# Patient Record
Sex: Female | Born: 1960 | Race: White | Hispanic: No | Marital: Single | Smoking: Current every day smoker
Health system: Southern US, Community
[De-identification: ages and names within clinical notes are randomized; demographics above are authoritative.]

## PROBLEM LIST (undated history)

## (undated) DIAGNOSIS — E2749 Other adrenocortical insufficiency: Secondary | ICD-10-CM

## (undated) DIAGNOSIS — G8929 Other chronic pain: Secondary | ICD-10-CM

## (undated) DIAGNOSIS — I5032 Chronic diastolic (congestive) heart failure: Secondary | ICD-10-CM

## (undated) DIAGNOSIS — D649 Anemia, unspecified: Secondary | ICD-10-CM

## (undated) DIAGNOSIS — G4730 Sleep apnea, unspecified: Secondary | ICD-10-CM

## (undated) DIAGNOSIS — K219 Gastro-esophageal reflux disease without esophagitis: Secondary | ICD-10-CM

## (undated) DIAGNOSIS — F0390 Unspecified dementia without behavioral disturbance: Secondary | ICD-10-CM

## (undated) DIAGNOSIS — C801 Malignant (primary) neoplasm, unspecified: Secondary | ICD-10-CM

## (undated) DIAGNOSIS — I2510 Atherosclerotic heart disease of native coronary artery without angina pectoris: Secondary | ICD-10-CM

## (undated) DIAGNOSIS — J449 Chronic obstructive pulmonary disease, unspecified: Secondary | ICD-10-CM

## (undated) DIAGNOSIS — J329 Chronic sinusitis, unspecified: Secondary | ICD-10-CM

## (undated) DIAGNOSIS — F419 Anxiety disorder, unspecified: Secondary | ICD-10-CM

## (undated) DIAGNOSIS — E2740 Unspecified adrenocortical insufficiency: Secondary | ICD-10-CM

## (undated) DIAGNOSIS — J441 Chronic obstructive pulmonary disease with (acute) exacerbation: Secondary | ICD-10-CM

## (undated) DIAGNOSIS — E785 Hyperlipidemia, unspecified: Secondary | ICD-10-CM

## (undated) DIAGNOSIS — E240 Pituitary-dependent Cushing's disease: Secondary | ICD-10-CM

## (undated) DIAGNOSIS — C439 Malignant melanoma of skin, unspecified: Secondary | ICD-10-CM

## (undated) HISTORY — PX: UMBILICAL HERNIA REPAIR: SHX196

## (undated) HISTORY — DX: Other adrenocortical insufficiency: E27.49

## (undated) HISTORY — PX: NASAL SINUS SURGERY: SHX719

## (undated) HISTORY — DX: Sleep apnea, unspecified: G47.30

## (undated) HISTORY — PX: VAGINAL HYSTERECTOMY: SUR661

## (undated) HISTORY — DX: Malignant melanoma of skin, unspecified: C43.9

## (undated) HISTORY — DX: Chronic sinusitis, unspecified: J32.9

## (undated) HISTORY — DX: Anemia, unspecified: D64.9

## (undated) HISTORY — DX: Chronic obstructive pulmonary disease with (acute) exacerbation: J44.1

## (undated) HISTORY — PX: OTHER SURGICAL HISTORY: SHX169

## (undated) HISTORY — PX: APPENDECTOMY: SHX54

## (undated) HISTORY — PX: NASAL SEPTUM SURGERY: SHX37

---

## 2000-10-02 ENCOUNTER — Ambulatory Visit (HOSPITAL_COMMUNITY): Admission: RE | Admit: 2000-10-02 | Discharge: 2000-10-02 | Payer: Self-pay | Admitting: Neurosurgery

## 2000-10-02 ENCOUNTER — Encounter: Payer: Self-pay | Admitting: Neurosurgery

## 2001-07-05 ENCOUNTER — Ambulatory Visit (HOSPITAL_COMMUNITY): Admission: RE | Admit: 2001-07-05 | Discharge: 2001-07-05 | Payer: Self-pay | Admitting: Neurosurgery

## 2001-07-05 ENCOUNTER — Encounter: Payer: Self-pay | Admitting: Neurosurgery

## 2002-01-16 ENCOUNTER — Encounter: Payer: Self-pay | Admitting: Emergency Medicine

## 2002-01-16 ENCOUNTER — Emergency Department (HOSPITAL_COMMUNITY): Admission: EM | Admit: 2002-01-16 | Discharge: 2002-01-16 | Payer: Self-pay | Admitting: Emergency Medicine

## 2002-03-21 ENCOUNTER — Encounter: Payer: Self-pay | Admitting: Neurosurgery

## 2002-03-21 ENCOUNTER — Inpatient Hospital Stay (HOSPITAL_COMMUNITY): Admission: RE | Admit: 2002-03-21 | Discharge: 2002-03-22 | Payer: Self-pay | Admitting: Neurosurgery

## 2002-12-13 ENCOUNTER — Encounter: Admission: RE | Admit: 2002-12-13 | Discharge: 2002-12-13 | Payer: Self-pay | Admitting: Neurosurgery

## 2002-12-13 ENCOUNTER — Encounter: Payer: Self-pay | Admitting: Neurosurgery

## 2002-12-13 ENCOUNTER — Encounter: Payer: Self-pay | Admitting: Radiology

## 2007-01-29 ENCOUNTER — Emergency Department: Payer: Self-pay | Admitting: Emergency Medicine

## 2007-02-21 ENCOUNTER — Inpatient Hospital Stay: Payer: Self-pay | Admitting: Obstetrics & Gynecology

## 2007-04-06 ENCOUNTER — Ambulatory Visit: Payer: Self-pay | Admitting: General Surgery

## 2007-05-25 ENCOUNTER — Ambulatory Visit: Payer: Self-pay | Admitting: General Surgery

## 2008-11-01 ENCOUNTER — Ambulatory Visit: Payer: Self-pay | Admitting: Family

## 2009-01-03 ENCOUNTER — Ambulatory Visit: Payer: Self-pay | Admitting: Specialist

## 2009-01-25 ENCOUNTER — Ambulatory Visit: Payer: Self-pay | Admitting: Specialist

## 2010-05-22 ENCOUNTER — Ambulatory Visit: Payer: Self-pay | Admitting: Specialist

## 2010-06-27 ENCOUNTER — Ambulatory Visit: Payer: Self-pay | Admitting: Specialist

## 2011-04-24 ENCOUNTER — Inpatient Hospital Stay: Payer: Self-pay | Admitting: Internal Medicine

## 2011-05-07 ENCOUNTER — Ambulatory Visit: Payer: Self-pay | Admitting: Internal Medicine

## 2011-07-10 ENCOUNTER — Ambulatory Visit: Payer: Self-pay | Admitting: Neurosurgery

## 2011-09-07 ENCOUNTER — Ambulatory Visit: Payer: Self-pay | Admitting: Specialist

## 2011-10-02 DIAGNOSIS — J309 Allergic rhinitis, unspecified: Secondary | ICD-10-CM | POA: Insufficient documentation

## 2012-06-23 ENCOUNTER — Ambulatory Visit: Payer: Self-pay | Admitting: Nurse Practitioner

## 2012-09-08 ENCOUNTER — Encounter: Payer: Self-pay | Admitting: Neurosurgery

## 2012-10-11 ENCOUNTER — Other Ambulatory Visit: Payer: Self-pay | Admitting: Neurosurgery

## 2012-10-11 DIAGNOSIS — M545 Low back pain, unspecified: Secondary | ICD-10-CM

## 2012-10-11 DIAGNOSIS — M47817 Spondylosis without myelopathy or radiculopathy, lumbosacral region: Secondary | ICD-10-CM

## 2012-10-15 ENCOUNTER — Other Ambulatory Visit: Payer: Self-pay

## 2012-10-17 ENCOUNTER — Ambulatory Visit
Admission: RE | Admit: 2012-10-17 | Discharge: 2012-10-17 | Disposition: A | Discharge status: Home / Self Care | Payer: 59 | Source: Ambulatory Visit | Attending: Neurosurgery | Admitting: Neurosurgery

## 2012-10-17 DIAGNOSIS — M545 Low back pain, unspecified: Secondary | ICD-10-CM

## 2012-10-17 DIAGNOSIS — M47817 Spondylosis without myelopathy or radiculopathy, lumbosacral region: Secondary | ICD-10-CM

## 2013-01-02 ENCOUNTER — Ambulatory Visit: Payer: Self-pay | Admitting: Specialist

## 2013-06-17 ENCOUNTER — Ambulatory Visit: Payer: Self-pay | Admitting: Neurosurgery

## 2013-07-11 ENCOUNTER — Ambulatory Visit: Payer: Self-pay | Admitting: Internal Medicine

## 2013-07-20 HISTORY — PX: OTHER SURGICAL HISTORY: SHX169

## 2013-10-10 ENCOUNTER — Ambulatory Visit: Payer: Self-pay | Admitting: Podiatry

## 2013-10-16 ENCOUNTER — Ambulatory Visit: Payer: 59 | Admitting: Podiatry

## 2014-06-20 ENCOUNTER — Ambulatory Visit: Payer: 59 | Admitting: Podiatry

## 2015-03-20 ENCOUNTER — Emergency Department
Admission: EM | Admit: 2015-03-20 | Discharge: 2015-03-20 | Disposition: A | Discharge status: Home / Self Care | Payer: Medicaid Other | Attending: Emergency Medicine | Admitting: Emergency Medicine

## 2015-03-20 ENCOUNTER — Encounter: Payer: Self-pay | Admitting: Emergency Medicine

## 2015-03-20 ENCOUNTER — Emergency Department: Payer: Medicaid Other

## 2015-03-20 DIAGNOSIS — Y998 Other external cause status: Secondary | ICD-10-CM | POA: Insufficient documentation

## 2015-03-20 DIAGNOSIS — Z720 Tobacco use: Secondary | ICD-10-CM | POA: Insufficient documentation

## 2015-03-20 DIAGNOSIS — Y92009 Unspecified place in unspecified non-institutional (private) residence as the place of occurrence of the external cause: Secondary | ICD-10-CM | POA: Diagnosis not present

## 2015-03-20 DIAGNOSIS — Z23 Encounter for immunization: Secondary | ICD-10-CM | POA: Insufficient documentation

## 2015-03-20 DIAGNOSIS — L03115 Cellulitis of right lower limb: Secondary | ICD-10-CM | POA: Diagnosis not present

## 2015-03-20 DIAGNOSIS — Y9389 Activity, other specified: Secondary | ICD-10-CM | POA: Insufficient documentation

## 2015-03-20 DIAGNOSIS — W01198A Fall on same level from slipping, tripping and stumbling with subsequent striking against other object, initial encounter: Secondary | ICD-10-CM | POA: Diagnosis not present

## 2015-03-20 DIAGNOSIS — Z880 Allergy status to penicillin: Secondary | ICD-10-CM | POA: Insufficient documentation

## 2015-03-20 DIAGNOSIS — S81811A Laceration without foreign body, right lower leg, initial encounter: Secondary | ICD-10-CM | POA: Diagnosis present

## 2015-03-20 HISTORY — DX: Unspecified adrenocortical insufficiency: E27.40

## 2015-03-20 LAB — CBC WITH DIFFERENTIAL/PLATELET
BASOS ABS: 0.1 10*3/uL (ref 0–0.1)
BASOS PCT: 1 %
EOS ABS: 0 10*3/uL (ref 0–0.7)
Eosinophils Relative: 0 %
HEMATOCRIT: 38 % (ref 35.0–47.0)
Hemoglobin: 12.6 g/dL (ref 12.0–16.0)
Lymphocytes Relative: 19 %
Lymphs Abs: 2.3 10*3/uL (ref 1.0–3.6)
MCH: 29.8 pg (ref 26.0–34.0)
MCHC: 33.3 g/dL (ref 32.0–36.0)
MCV: 89.7 fL (ref 80.0–100.0)
MONO ABS: 0.7 10*3/uL (ref 0.2–0.9)
Monocytes Relative: 6 %
NEUTROS ABS: 8.8 10*3/uL — AB (ref 1.4–6.5)
Neutrophils Relative %: 74 %
PLATELETS: 331 10*3/uL (ref 150–440)
RBC: 4.23 MIL/uL (ref 3.80–5.20)
RDW: 15 % — AB (ref 11.5–14.5)
WBC: 11.9 10*3/uL — ABNORMAL HIGH (ref 3.6–11.0)

## 2015-03-20 LAB — BASIC METABOLIC PANEL
ANION GAP: 13 (ref 5–15)
BUN: 13 mg/dL (ref 6–20)
CALCIUM: 9.3 mg/dL (ref 8.9–10.3)
CO2: 28 mmol/L (ref 22–32)
CREATININE: 1.09 mg/dL — AB (ref 0.44–1.00)
Chloride: 95 mmol/L — ABNORMAL LOW (ref 101–111)
GFR, EST NON AFRICAN AMERICAN: 57 mL/min — AB (ref >60)
GLUCOSE: 93 mg/dL (ref 65–99)
Potassium: 3.2 mmol/L — ABNORMAL LOW (ref 3.5–5.1)
Sodium: 136 mmol/L (ref 135–145)

## 2015-03-20 LAB — SEDIMENTATION RATE: SED RATE: 52 mm/h — AB (ref 0–30)

## 2015-03-20 MED ORDER — HYDROMORPHONE HCL 1 MG/ML IJ SOLN
1.0000 mg | Freq: Once | INTRAMUSCULAR | Status: AC
  Administered 2015-03-20: 1 mg via INTRAVENOUS
  Filled 2015-03-20: qty 1

## 2015-03-20 MED ORDER — TETANUS-DIPHTH-ACELL PERTUSSIS 5-2.5-18.5 LF-MCG/0.5 IM SUSP
0.5000 mL | Freq: Once | INTRAMUSCULAR | Status: AC
  Administered 2015-03-20: 0.5 mL via INTRAMUSCULAR
  Filled 2015-03-20: qty 0.5

## 2015-03-20 MED ORDER — HYDROMORPHONE HCL 2 MG PO TABS: 1.0000 mg | ORAL_TABLET | ORAL | Status: DC | PRN

## 2015-03-20 MED ORDER — CLINDAMYCIN HCL 300 MG PO CAPS: 300.0000 mg | ORAL_CAPSULE | Freq: Four times a day (QID) | ORAL | Status: DC

## 2015-03-20 NOTE — ED Notes (Signed)
Pt from home after fall on 8/21. She fell in her laundry room and hit either an air compressor or paint can - not sure which. Leg has scabbed over skin tear with red, hot border approximately 1 inch around. Pt states she is in considerable pain. NAD noted.

## 2015-03-20 NOTE — ED Notes (Signed)
Here for skin tear to right lower leg.  Some redness noted around tear.

## 2015-03-20 NOTE — ED Provider Notes (Signed)
[facility] Emergency Department Provider Note  ____________________________________________  Time seen: Approximately 2:45 PM  I have reviewed the triage vital signs and the nursing notes.   HISTORY  Chief Complaint Extremity Laceration    HPI [name] is a 54 y.o. female who presents for evaluation of a laceration to her right lower leg times one week. Patient states that she is on prednisone and bleeds easily. Has been having increasing redness and poor healing around that concerning for osteomyelitis.   Past Medical History  Diagnosis Date  . Adrenal insufficiency     There are no active problems to display for this patient.   No past surgical history on file.  Current Outpatient Rx  Name  Route  Sig  Dispense  Refill  . clindamycin (CLEOCIN) 300 MG capsule   Oral   Take 1 capsule (300 mg total) by mouth 4 (four) times daily.   40 capsule   0     Allergies Levaquin; Codeine; Cymbalta; Eggs or egg-derived products; Guaifenesin & derivatives; Keflex; Klonopin; Macrobid; Penicillins; and Sulfa antibiotics  No family history on file.  Social History Social History  Substance Use Topics  . Smoking status: Current Every Day Smoker  . Smokeless tobacco: None  . Alcohol Use: None    Review of Systems Constitutional: No fever/chills Eyes: No visual changes. ENT: No sore throat. Cardiovascular: Denies chest pain. Respiratory: Denies shortness of breath. Gastrointestinal: No abdominal pain.  No nausea, no vomiting.  No diarrhea.  No constipation. Genitourinary: Negative for dysuria. Musculoskeletal: Negative for back pain. Skin: Positive for skin tear to right lower anterior leg. Redness around the wound. Neurological: Negative for headaches, focal weakness or numbness.  10-point ROS otherwise negative.  ____________________________________________   PHYSICAL EXAM:  VITAL SIGNS: ED Triage Vitals  Enc Vitals Group     BP  03/20/15 1421 149/79 mmHg     Pulse Rate 03/20/15 1421 103     Resp 03/20/15 1421 18     Temp 03/20/15 1421 98.1 F (36.7 C)     Temp Source 03/20/15 1421 Oral     SpO2 03/20/15 1421 95 %     Weight 03/20/15 1421 172 lb (78.019 kg)     Height --      Head Cir --      Peak Flow --      Pain Score 03/20/15 1421 10     Pain Loc --      Pain Edu? --      Excl. in [place]? --     Constitutional: Alert and oriented. Well appearing and in no acute distress. Eyes: Conjunctivae are normal. PERRL. EOMI. Head: Atraumatic. Nose: No congestion/rhinnorhea. Mouth/Throat: Mucous membranes are moist.  Oropharynx non-erythematous. Neck: No stridor.   Cardiovascular: Normal rate, regular rhythm. Grossly normal heart sounds.  Good peripheral circulation. Respiratory: Normal respiratory effort.  No retractions. Lungs CTAB. Musculoskeletal: No lower extremity tenderness nor edema.  No joint effusions. Neurologic:  Normal speech and language. No gross focal neurologic deficits are appreciated. No gait instability. Skin:  Skin tear to right lower leg with 6 cm area of erythema noted circumferentially around the tear. Dried blood noted within the wound itself. Positive tenderness to the skin Psychiatric: Mood and affect are normal. Speech and behavior are normal.  ____________________________________________   LABS (all labs ordered are listed, but only abnormal results are displayed)  Labs Reviewed  BASIC METABOLIC PANEL - Abnormal; Notable for the following:    Potassium 3.2 (*)  Chloride 95 (*)    Creatinine, Ser 1.09 (*)    GFR calc non Af Amer 57 (*)    All other components within normal limits  CBC WITH DIFFERENTIAL/PLATELET - Abnormal; Notable for the following:    WBC 11.9 (*)    RDW 15.0 (*)    Neutro Abs 8.8 (*)    All other components within normal limits  SEDIMENTATION RATE - Abnormal; Notable for the following:    Sed Rate 52 (*)    All other components within normal limits   CULTURE, BLOOD (ROUTINE X 2)  CULTURE, BLOOD (ROUTINE X 2)   ____________________________________________  RADIOLOGY  Negative for osteomyelitis interpreted by radiologist and reviewed by myself. ____________________________________________   PROCEDURES  Procedure(s) performed: None  Critical Care performed: No  ____________________________________________   INITIAL IMPRESSION / ASSESSMENT AND PLAN / ED COURSE  Pertinent labs & imaging results that were available during my care of the patient were reviewed by me and considered in my medical decision making (see chart for details).  Cellulitis right lower leg. Rx started for clindamycin 300 mg 4 times a day based on present allergy list. Continue pain medications from home. Return in 48 hours for wound check and voices understanding and offers no other emergency medical complaints at this time. ____________________________________________   FINAL CLINICAL IMPRESSION(S) / ED DIAGNOSES  Final diagnoses:  Cellulitis of right lower leg      Arlyss Repress, PA-C 03/20/15 1636  Harvest Dark, MD 03/20/15 2315

## 2015-03-20 NOTE — ED Notes (Signed)
.  [email or web address]

## 2015-03-20 NOTE — Discharge Instructions (Signed)

## 2015-03-22 ENCOUNTER — Ambulatory Visit: Payer: Self-pay | Admitting: Surgery

## 2015-03-22 ENCOUNTER — Telehealth: Payer: Self-pay | Admitting: Emergency Medicine

## 2015-03-22 NOTE — ED Notes (Signed)
Pt called asking if she had to come back today.  Say sshe has only taken 1 full day of antibiiotic and would rather wait until tomorrow.  i told her that her instructions state 48 hours and that would be this afternoon.  I told her it would ultimately be up to her to  Decide on return time.  She says her pcp is in [place], but she plans to change to one in .  i told her that so far there was no growth on the blood cutlures.  She says the would is opening slightly and draining some now.  Says she feels about the same as she did on visit date--no better and no worse.  I told her that she would need to make the decision on when to return, as i could only tell what the instructions said.  I told her that it would be very bad if she became septic.    She says she will return, either today or tomorrow, and if she waits until tomorrow, she will return sooner if she starts feeling worse.

## 2015-03-23 ENCOUNTER — Emergency Department
Admission: EM | Admit: 2015-03-23 | Discharge: 2015-03-23 | Disposition: A | Discharge status: Home / Self Care | Payer: Medicaid Other | Attending: Emergency Medicine | Admitting: Emergency Medicine

## 2015-03-23 ENCOUNTER — Encounter: Payer: Self-pay | Admitting: Emergency Medicine

## 2015-03-23 DIAGNOSIS — Z4800 Encounter for change or removal of nonsurgical wound dressing: Secondary | ICD-10-CM | POA: Diagnosis present

## 2015-03-23 DIAGNOSIS — Z880 Allergy status to penicillin: Secondary | ICD-10-CM | POA: Diagnosis not present

## 2015-03-23 DIAGNOSIS — L03115 Cellulitis of right lower limb: Secondary | ICD-10-CM

## 2015-03-23 DIAGNOSIS — Z792 Long term (current) use of antibiotics: Secondary | ICD-10-CM | POA: Insufficient documentation

## 2015-03-23 DIAGNOSIS — Z720 Tobacco use: Secondary | ICD-10-CM | POA: Diagnosis not present

## 2015-03-23 MED ORDER — HYDROMORPHONE HCL 2 MG PO TABS: 1.0000 mg | ORAL_TABLET | ORAL | Status: DC | PRN

## 2015-03-23 NOTE — ED Provider Notes (Signed)
[facility] Emergency Department Provider Note  ____________________________________________  Time seen: Approximately 12:06 PM  I have reviewed the triage vital signs and the nursing notes.   HISTORY  Chief Complaint Wound Check    HPI [name] is a 54 y.o. female patient here today for follow-up of the left lower leg. Patient is skin tear that was slow healing of the suspected cellulitis. Patient appears visits have blood cultures drawn which were negative. Patient placed on clindamycin and Dilaudid. Patient today for reevaluation will and wound check. Patient rated her complaint is a 7/10.   Past Medical History  Diagnosis Date  . Adrenal insufficiency     There are no active problems to display for this patient.   No past surgical history on file.  Current Outpatient Rx  Name  Route  Sig  Dispense  Refill  . clindamycin (CLEOCIN) 300 MG capsule   Oral   Take 1 capsule (300 mg total) by mouth 4 (four) times daily.   40 capsule   0   . HYDROmorphone (DILAUDID) 2 MG tablet   Oral   Take 0.5 tablets (1 mg total) by mouth every 4 (four) hours as needed for severe pain. DO NOT TAKE WITH OXYCODONE!   8 tablet   0     Allergies Levaquin; Codeine; Cymbalta; Eggs or egg-derived products; Guaifenesin & derivatives; Keflex; Klonopin; Macrobid; Penicillins; and Sulfa antibiotics  No family history on file.  Social History Social History  Substance Use Topics  . Smoking status: Current Every Day Smoker  . Smokeless tobacco: None  . Alcohol Use: No    Review of Systems Constitutional: No fever/chills Eyes: No visual changes. ENT: No sore throat. Cardiovascular: Denies chest pain. Respiratory: Denies shortness of breath. Gastrointestinal: No abdominal pain.  No nausea, no vomiting.  No diarrhea.  No constipation. Genitourinary: Negative for dysuria. Musculoskeletal: Negative for back pain. Skin: Negative for rash. Multiple right  lower leg. Neurological: Negative for headaches, focal weakness or numbness. Hematological/Lymphatic: Allergic/Immunilogical: See extensive medication list.  10-point ROS otherwise negative.  ____________________________________________   PHYSICAL EXAM:  VITAL SIGNS: ED Triage Vitals  Enc Vitals Group     BP 03/23/15 1123 115/73 mmHg     Pulse Rate 03/23/15 1123 108     Resp 03/23/15 1123 18     Temp 03/23/15 1123 98.2 F (36.8 C)     Temp Source 03/23/15 1123 Oral     SpO2 03/23/15 1123 95 %     Weight 03/23/15 1123 172 lb (78.019 kg)     Height 03/23/15 1123 5\' 4"  (1.626 m)     Head Cir --      Peak Flow --      Pain Score 03/23/15 1124 7     Pain Loc --      Pain Edu? --      Excl. in [place]? --     Constitutional: Alert and oriented. Well appearing and in no acute distress. Eyes: Conjunctivae are normal. PERRL. EOMI. Head: Atraumatic. Nose: No congestion/rhinnorhea. Mouth/Throat: Mucous membranes are moist.  Oropharynx non-erythematous. Neck: No stridor.  No cervical spine tenderness to palpation. Hematological/Lymphatic/Immunilogical: No cervical lymphadenopathy. Cardiovascular: Normal rate, regular rhythm. Grossly normal heart sounds.  Good peripheral circulation. Respiratory: Normal respiratory effort.  No retractions. Lungs CTAB. Gastrointestinal: Soft and nontender. No distention. No abdominal bruits. No CVA tenderness. Musculoskeletal: No lower extremity tenderness nor edema.  No joint effusions. Neurologic:  Normal speech and language. No gross focal neurologic  deficits are appreciated. No gait instability. Skin:  Skin is warm, dry and intact. No rash noted. Noticed erythema and edema has not exceeded the lateral skin marker placed on previous visit. There is no discharge from the wound site at this time. Area still has some moderate guarding with palpation. Psychiatric: Mood and affect are normal. Speech and behavior are  normal.  ____________________________________________   LABS (all labs ordered are listed, but only abnormal results are displayed)  Labs Reviewed - No data to display ____________________________________________  EKG   ____________________________________________  RADIOLOGY   ____________________________________________   PROCEDURES  Procedure(s) performed: None  Critical Care performed: No  ____________________________________________   INITIAL IMPRESSION / ASSESSMENT AND PLAN / ED COURSE  Pertinent labs & imaging results that were available during my care of the patient were reviewed by me and considered in my medical decision making (see chart for details).  Resolving cellulitis right lower leg. Advised patient attending the antibodies as directed. Advised patient to call the wound clinic on Tuesday morning and reschedule her appointment for continual care. Patient advised return by ER condition worsens. ____________________________________________   FINAL CLINICAL IMPRESSION(S) / ED DIAGNOSES  Final diagnoses:  Cellulitis of right leg without foot      Sable Feil, PA-C 03/23/15 1212  Daymon Larsen, MD 03/23/15 1242

## 2015-03-23 NOTE — ED Notes (Signed)
PA at bedside at this time.  

## 2015-03-23 NOTE — ED Notes (Signed)
Sterile dressing applied to pt right lower leg.  Pt verbalized understanding of redressing and to leave open to air while at home.

## 2015-03-23 NOTE — Discharge Instructions (Signed)
Apply dry dressing at night and when erver leaving the house.  Call Wound Clinic on Tuesday to re-schedule appointment.

## 2015-03-23 NOTE — ED Notes (Signed)
Patient to ER for wound check to lower leg.

## 2015-03-25 LAB — CULTURE, BLOOD (ROUTINE X 2): CULTURE: NO GROWTH

## 2015-03-27 ENCOUNTER — Encounter: Payer: Medicaid Other | Attending: Surgery | Admitting: Surgery

## 2015-03-27 DIAGNOSIS — Z7952 Long term (current) use of systemic steroids: Secondary | ICD-10-CM | POA: Insufficient documentation

## 2015-03-27 DIAGNOSIS — E278 Other specified disorders of adrenal gland: Secondary | ICD-10-CM | POA: Diagnosis not present

## 2015-03-27 DIAGNOSIS — J449 Chronic obstructive pulmonary disease, unspecified: Secondary | ICD-10-CM | POA: Diagnosis not present

## 2015-03-27 DIAGNOSIS — I872 Venous insufficiency (chronic) (peripheral): Secondary | ICD-10-CM | POA: Diagnosis not present

## 2015-03-27 DIAGNOSIS — L97211 Non-pressure chronic ulcer of right calf limited to breakdown of skin: Secondary | ICD-10-CM | POA: Diagnosis present

## 2015-03-27 DIAGNOSIS — I83212 Varicose veins of right lower extremity with both ulcer of calf and inflammation: Secondary | ICD-10-CM | POA: Insufficient documentation

## 2015-03-28 NOTE — Progress Notes (Addendum)
MATEJA, DIER ([id number]) Visit Report for 03/27/2015 Allergy List Details Patient Name: JANETT, KAMATH. Date of Service: 03/27/2015 1:00 PM Medical Record Number: [id number] Patient Account Number: [id number] Date of Birth/Sex: 03-13-1961 (54 y.o. Female) Treating RN: Montey Hora Primary Care Physician: Sharin Mons Other Clinician: Referring Physician: Delman Kitten Treating Physician/Extender: BURNS III, WALTER Weeks in Treatment: 0 Allergies Active Allergies Levaquin codeine Cymbalta Egg Derived guaifenesin Keflex Klonopin Macrobid penicillin Sulfa (Sulfonamide Antibiotics) gatifloxacin Allergy Notes Electronic Signature(s) Signed: 03/28/2015 5:13:06 PM By: Montey Hora Previous Signature: 03/27/2015 1:11:59 PM Version By: Montey Hora Entered By: Montey Hora on 03/27/2015 13:27:25 Trotti, Breasia D. ([id number]) -------------------------------------------------------------------------------- Arrival Information Details Patient Name: MARIELA, REX D. Date of Service: 03/27/2015 1:00 PM Medical Record Number: [id number] Patient Account Number: [id number] Date of Birth/Sex: 1961-03-05 (54 y.o. Female) Treating RN: Montey Hora Primary Care Physician: Sharin Mons Other Clinician: Referring Physician: Delman Kitten Treating Physician/Extender: BURNS III, Charlean Sanfilippo in Treatment: 0 Visit Information Patient Arrived: Ambulatory Arrival Time: 13:21 Accompanied By: self Transfer Assistance: None Patient Identification Verified: Yes Secondary Verification Process Yes Completed: Electronic Signature(s) Signed: 03/28/2015 5:13:06 PM By: Montey Hora Entered By: Montey Hora on 03/27/2015 13:22:41 Eaglin, Kameo D. ([id number]) -------------------------------------------------------------------------------- Clinic Level of Care Assessment Details Patient Name: AIDEL, DAVISSON D. Date of Service: 03/27/2015 1:00 PM Medical Record Number: [id number] Patient Account Number:  [id number] Date of Birth/Sex: Feb 11, 1961 (54 y.o. Female) Treating RN: Montey Hora Primary Care Physician: Sharin Mons Other Clinician: Referring Physician: Delman Kitten Treating Physician/Extender: BURNS III, Charlean Sanfilippo in Treatment: 0 Clinic Level of Care Assessment Items TOOL 1 Quantity Score []  - Use when EandM and Procedure is performed on INITIAL visit 0 ASSESSMENTS - Nursing Assessment / Reassessment X - General Physical Exam (combine w/ comprehensive assessment (listed just 1 20 below) when performed on new pt. evals) X - Comprehensive Assessment (HX, ROS, Risk Assessments, Wounds Hx, etc.) 1 25 ASSESSMENTS - Wound and Skin Assessment / Reassessment []  - Dermatologic / Skin Assessment (not related to wound area) 0 ASSESSMENTS - Ostomy and/or Continence Assessment and Care []  - Incontinence Assessment and Management 0 []  - Ostomy Care Assessment and Management (repouching, etc.) 0 PROCESS - Coordination of Care X - Simple Patient / Family Education for ongoing care 1 15 []  - Complex (extensive) Patient / Family Education for ongoing care 0 X - Staff obtains Consents, Records, Test Results / Process Orders 1 10 []  - Staff telephones HHA, Nursing Homes / Clarify orders / etc 0 []  - Routine Transfer to another Facility (non-emergent condition) 0 []  - Routine Hospital Admission (non-emergent condition) 0 X - New Admissions / Biomedical engineer / Ordering NPWT, Apligraf, etc. 1 15 []  - Emergency Hospital Admission (emergent condition) 0 PROCESS - Special Needs []  - Pediatric / Minor Patient Management 0 []  - Isolation Patient Management 0 DANISA, KOPEC. ([id number]) []  - Hearing / Language / Visual special needs 0 []  - Assessment of Community assistance (transportation, D/C planning, etc.) 0 []  - Additional assistance / Altered mentation 0 []  - Support Surface(s) Assessment (bed, cushion, seat, etc.) 0 INTERVENTIONS - Miscellaneous []  - External ear exam 0 []  -  Patient Transfer (multiple staff / Civil Service fast streamer / Similar devices) 0 []  - Simple Staple / Suture removal (25 or less) 0 []  - Complex Staple / Suture removal (26 or more) 0 []  - Hypo/Hyperglycemic Management (do not check if billed separately) 0 []  - Ankle / Brachial Index (ABI) - do not check if billed separately 0 Has the  patient been seen at the hospital within the last three years: Yes Total Score: 85 Level Of Care: New/Established - Level 3 Electronic Signature(s) Signed: 03/28/2015 5:13:06 PM By: Montey Hora Entered By: Montey Hora on 03/27/2015 13:59:58 Gauthier, Mairany D. ([id number]) -------------------------------------------------------------------------------- Encounter Discharge Information Details Patient Name: CHERYEL, KYTE D. Date of Service: 03/27/2015 1:00 PM Medical Record Number: [id number] Patient Account Number: [id number] Date of Birth/Sex: Dec 15, 1960 (54 y.o. Female) Treating RN: Montey Hora Primary Care Physician: Sharin Mons Other Clinician: Referring Physician: Delman Kitten Treating Physician/Extender: BURNS III, Charlean Sanfilippo in Treatment: 0 Encounter Discharge Information Items Discharge Pain Level: 0 Discharge Condition: Stable Ambulatory Status: Ambulatory Discharge Destination: Home Transportation: Private Auto Accompanied By: brother Schedule Follow-up Appointment: Yes Medication Reconciliation completed and provided to Patient/Care No Jamil Castillo: Provided on Clinical Summary of Care: 03/27/2015 Form Type Recipient Paper Patient SW Electronic Signature(s) Signed: 03/27/2015 2:23:18 PM By: Ruthine Dose Entered By: Ruthine Dose on 03/27/2015 14:23:18 [place], [place] D. ([id number]) -------------------------------------------------------------------------------- Lower Extremity Assessment Details Patient Name: KEYASHA, MIAH D. Date of Service: 03/27/2015 1:00 PM Medical Record Number: [id number] Patient Account Number: [id number] Date of Birth/Sex:  1960/12/11 (54 y.o. Female) Treating RN: Montey Hora Primary Care Physician: Sharin Mons Other Clinician: Referring Physician: Delman Kitten Treating Physician/Extender: BURNS III, Charlean Sanfilippo in Treatment: 0 Edema Assessment Assessed: [Left: No] [Right: No] E[Left: dema] [Right: :] Calf Left: Right: Point of Measurement: 30 cm From Medial Instep 36.7 cm 35.3 cm Ankle Left: Right: Point of Measurement: 10 cm From Medial Instep 20.4 cm 21.2 cm Vascular Assessment Pulses: Posterior Tibial Palpable: [Left:Yes] [Right:Yes] Doppler: [Left:Multiphasic] [Right:Multiphasic] Dorsalis Pedis Palpable: [Left:Yes] [Right:Yes] Doppler: [Left:Multiphasic] [Right:Multiphasic] Extremity colors, hair growth, and conditions: Extremity Color: [Left:Red] [Right:Normal] Hair Growth on Extremity: [Left:Yes] [Right:Yes] Temperature of Extremity: [Left:Warm] [Right:Warm] Capillary Refill: [Left:< 3 seconds] [Right:< 3 seconds] Toe Nail Assessment Left: Right: Thick: No No Discolored: No No Deformed: No No Improper Length and Hygiene: No No Electronic Signature(s) Signed: 03/28/2015 5:13:06 PM By: Jearld Shines, Katharine Look D. ([id number]) Entered By: Montey Hora on 03/27/2015 13:50:09 Koranda, Meri D. ([id number]) -------------------------------------------------------------------------------- Multi Wound Chart Details Patient Name: SARALYNN, LANGHORST D. Date of Service: 03/27/2015 1:00 PM Medical Record Number: [id number] Patient Account Number: [id number] Date of Birth/Sex: Dec 22, 1960 (54 y.o. Female) Treating RN: Montey Hora Primary Care Physician: Sharin Mons Other Clinician: Referring Physician: Delman Kitten Treating Physician/Extender: BURNS III, Charlean Sanfilippo in Treatment: 0 Vital Signs Height(in): 64 Pulse(bpm): 103 Weight(lbs): 172 Blood Pressure 128/71 (mmHg): Body Mass Index(BMI): 30 Temperature(F): 98.2 Respiratory Rate 18 (breaths/min): Photos: [1:No Photos]  [N/A:N/A] Wound Location: [1:Right Lower Leg - Anterior N/A] Wounding Event: [1:Trauma] [N/A:N/A] Primary Etiology: [1:Skin Tear] [N/A:N/A] Comorbid History: [1:Chronic Obstructive Pulmonary Disease (COPD), Sleep Apnea, Congestive Heart Failure, Raynaudos, Osteoarthritis, Neuropathy] [N/A:N/A] Date Acquired: [1:03/10/2015] [N/A:N/A] Weeks of Treatment: [1:0] [N/A:N/A] Wound Status: [1:Open] [N/A:N/A] Measurements L x W x D 2.7x3.7x0.2 [N/A:N/A] (cm) Area (cm) : [1:7.846] [N/A:N/A] Volume (cm) : [1:1.569] [N/A:N/A] % Reduction in Area: [1:0.00%] [N/A:N/A] % Reduction in Volume: 0.00% [N/A:N/A] Classification: [1:Full Thickness Without Exposed Support Structures] [N/A:N/A] Exudate Amount: [1:Medium] [N/A:N/A] Exudate Type: [1:Purulent] [N/A:N/A] Exudate Color: [1:yellow, brown, green] [N/A:N/A] Wound Margin: [1:Flat and Intact] [N/A:N/A] Granulation Amount: [1:None Present (0%)] [N/A:N/A] Necrotic Amount: [1:Large (67-100%)] [N/A:N/A] Necrotic Tissue: [1:Eschar] [N/A:N/A] Exposed Structures: [N/A:N/A] Fascia: No Fat: No Tendon: No Muscle: No Joint: No Bone: No Limited to Skin Breakdown Epithelialization: None N/A N/A Periwound Skin Texture: Edema: No N/A N/A Excoriation: No Induration: No Callus: No Crepitus: No Fluctuance: No Friable: No Rash:  No Scarring: No Periwound Skin Maceration: No N/A N/A Moisture: Moist: No Dry/Scaly: No Periwound Skin Color: Erythema: Yes N/A N/A Atrophie Blanche: No Cyanosis: No Ecchymosis: No Hemosiderin Staining: No Mottled: No Pallor: No Rubor: No Erythema Location: Circumferential N/A N/A Temperature: No Abnormality N/A N/A Tenderness on Yes N/A N/A Palpation: Wound Preparation: Ulcer Cleansing: N/A N/A Rinsed/Irrigated with Saline Topical Anesthetic Applied: Other: lidocaine 4% Treatment Notes Electronic Signature(s) Signed: 03/28/2015 5:13:06 PM By: Montey Hora Entered By: Montey Hora on 03/27/2015  13:59:14 Caine, Ronit D. ([id number]) -------------------------------------------------------------------------------- [place] Details Patient Name: BETZABE, BEVANS D. Date of Service: 03/27/2015 1:00 PM Medical Record Number: [id number] Patient Account Number: [id number] Date of Birth/Sex: 06-03-1961 (54 y.o. Female) Treating RN: Montey Hora Primary Care Physician: Sharin Mons Other Clinician: Referring Physician: Delman Kitten Treating Physician/Extender: BURNS III, Charlean Sanfilippo in Treatment: 0 Active Inactive Abuse / Safety / Falls / Self Care Management Nursing Diagnoses: Impaired physical mobility Potential for falls Goals: Patient will remain injury free Date Initiated: 03/27/2015 Goal Status: Active Interventions: Assess fall risk on admission and as needed Notes: Orientation to the Wound Care Program Nursing Diagnoses: Knowledge deficit related to the wound healing center program Goals: Patient/caregiver will verbalize understanding of the [facility] Date Initiated: 03/27/2015 Goal Status: Active Interventions: Provide education on orientation to the wound center Notes: Wound/Skin Impairment Nursing Diagnoses: Impaired tissue integrity Goals: Ulcer/skin breakdown will have a volume reduction of 30% by week 4 JUNITA, KUBOTA ([id number]) Date Initiated: 03/27/2015 Goal Status: Active Ulcer/skin breakdown will have a volume reduction of 50% by week 8 Date Initiated: 03/27/2015 Goal Status: Active Ulcer/skin breakdown will have a volume reduction of 80% by week 12 Date Initiated: 03/27/2015 Goal Status: Active Ulcer/skin breakdown will heal within 14 weeks Date Initiated: 03/27/2015 Goal Status: Active Interventions: Assess ulceration(s) every visit Notes: Electronic Signature(s) Signed: 03/28/2015 5:13:06 PM By: Montey Hora Entered By: Montey Hora on 03/27/2015 13:58:35 Warr, Sharolyn D.  ([id number]) -------------------------------------------------------------------------------- Pain Assessment Details Patient Name: MIAKODA, MCMILLION D. Date of Service: 03/27/2015 1:00 PM Medical Record Number: [id number] Patient Account Number: [id number] Date of Birth/Sex: 01/21/1961 (54 y.o. Female) Treating RN: Montey Hora Primary Care Physician: Sharin Mons Other Clinician: Referring Physician: Delman Kitten Treating Physician/Extender: BURNS III, Charlean Sanfilippo in Treatment: 0 Active Problems Location of Pain Severity and Description of Pain Patient Has Paino Yes Site Locations Pain Location: Pain in Ulcers With Dressing Change: Yes Duration of the Pain. Constant / Intermittento Constant Pain Management and Medication Current Pain Management: Electronic Signature(s) Signed: 03/28/2015 5:13:06 PM By: Montey Hora Entered By: Montey Hora on 03/27/2015 13:22:56 Rothery, Zuria D. ([id number]) -------------------------------------------------------------------------------- Patient/Caregiver Education Details Patient Name: VERANIA, SALBERG D. Date of Service: 03/27/2015 1:00 PM Medical Record Number: [id number] Patient Account Number: [id number] Date of Birth/Gender: Jul 19, 1961 (54 y.o. Female) Treating RN: Montey Hora Primary Care Physician: Sharin Mons Other Clinician: Referring Physician: Delman Kitten Treating Physician/Extender: BURNS III, Charlean Sanfilippo in Treatment: 0 Education Assessment Education Provided To: Patient and Caregiver Education Topics Provided Wound/Skin Impairment: Handouts: Other: wound care as ordered Methods: Demonstration, Explain/Verbal Responses: State content correctly Electronic Signature(s) Signed: 03/28/2015 5:13:06 PM By: Montey Hora Entered By: Montey Hora on 03/27/2015 14:21:04 [place], Rayvin D. ([id number]) -------------------------------------------------------------------------------- Wound Assessment Details Patient Name: CHASMINE, LENDER  D. Date of Service: 03/27/2015 1:00 PM Medical Record Number: [id number] Patient Account Number: [id number] Date of Birth/Sex: 1961/06/02 (54 y.o. Female) Treating RN: Montey Hora Primary Care Physician: Sharin Mons Other Clinician: Referring Physician: Delman Kitten Treating Physician/Extender: BURNS  III, WALTER Weeks in Treatment: 0 Wound Status Wound Number: 1 Primary Skin Tear Etiology: Wound Location: Right Lower Leg - Anterior Wound Open Wounding Event: Trauma Status: Date Acquired: 03/10/2015 Comorbid Chronic Obstructive Pulmonary Disease Weeks Of Treatment: 0 History: (COPD), Sleep Apnea, Congestive Clustered Wound: No Heart Failure, Raynaudos, Osteoarthritis, Neuropathy Photos Photo Uploaded By: Montey Hora on 03/28/2015 09:37:49 Wound Measurements Length: (cm) 2.7 Width: (cm) 3.7 Depth: (cm) 0.2 Area: (cm) 7.846 Volume: (cm) 1.569 % Reduction in Area: 0% % Reduction in Volume: 0% Epithelialization: None Tunneling: No Undermining: No Wound Description Full Thickness Without Exposed Classification: Support Structures Wound Margin: Flat and Intact Exudate Medium Amount: Exudate Type: Purulent Exudate Color: yellow, brown, green Foul Odor After Cleansing: No Wound Bed Granulation Amount: None Present (0%) Exposed Structure Pipe, Jasamine D. ([id number]) Necrotic Amount: Large (67-100%) Fascia Exposed: No Necrotic Quality: Eschar Fat Layer Exposed: No Tendon Exposed: No Muscle Exposed: No Joint Exposed: No Bone Exposed: No Limited to Skin Breakdown Periwound Skin Texture Texture Color No Abnormalities Noted: No No Abnormalities Noted: No Callus: No Atrophie Blanche: No Crepitus: No Cyanosis: No Excoriation: No Ecchymosis: No Fluctuance: No Erythema: Yes Friable: No Erythema Location: Circumferential Induration: No Hemosiderin Staining: No Localized Edema: No Mottled: No Rash: No Pallor: No Scarring: No Rubor: No Moisture  Temperature / Pain No Abnormalities Noted: No Temperature: No Abnormality Dry / Scaly: No Tenderness on Palpation: Yes Maceration: No Moist: No Wound Preparation Ulcer Cleansing: Rinsed/Irrigated with Saline Topical Anesthetic Applied: Other: lidocaine 4%, Treatment Notes Wound #1 (Right, Anterior Lower Leg) 1. Cleansed with: Clean wound with Normal Saline 2. Anesthetic Topical Lidocaine 4% cream to wound bed prior to debridement 3. Peri-wound Care: Skin Prep 4. Dressing Applied: Aquacel Ag 5. Secondary Dressing Applied Bordered Foam Dressing 6. Footwear/Offloading device applied Tubigrip Electronic Signature(s) Signed: 03/28/2015 5:13:06 PM By: Jearld Shines, Katharine Look D. ([id number]) Entered By: Montey Hora on 03/27/2015 13:44:13 [place], Clark D. ([id number]) -------------------------------------------------------------------------------- Vitals Details Patient Name: BRENDA, SAMANO D. Date of Service: 03/27/2015 1:00 PM Medical Record Number: [id number] Patient Account Number: [id number] Date of Birth/Sex: 02-Apr-1961 (54 y.o. Female) Treating RN: Montey Hora Primary Care Physician: Sharin Mons Other Clinician: Referring Physician: Delman Kitten Treating Physician/Extender: BURNS III, Charlean Sanfilippo in Treatment: 0 Vital Signs Time Taken: 13:22 Temperature (F): 98.2 Height (in): 64 Pulse (bpm): 103 Source: Stated Respiratory Rate (breaths/min): 18 Weight (lbs): 172 Blood Pressure (mmHg): 128/71 Source: Stated Reference Range: 80 - 120 mg / dl Body Mass Index (BMI): 29.5 Electronic Signature(s) Signed: 03/28/2015 5:13:06 PM By: Montey Hora Entered By: Montey Hora on 03/27/2015 13:26:04

## 2015-03-29 NOTE — Progress Notes (Signed)
BRODIE, SCOVELL ([id number]) Visit Report for 03/27/2015 Chief Complaint Document Details Patient Name: [name], [name]. Date of Service: 03/27/2015 1:00 PM Medical Record Patient Account Number: [id number] Number: Treating RN: Montey Hora 1961-04-29 (54 y.o. Other Clinician: Date of Birth/Sex: Female) Treating BURNS III, Primary Care Physician: Sharin Mons Physician/Extender: Thayer Jew Referring Physician: Delman Kitten Weeks in Treatment: 0 Information Obtained from: Patient Chief Complaint Right calf traumatic ulceration. Electronic Signature(s) Signed: 03/27/2015 4:02:17 PM By: Loletha Grayer MD Entered By: Loletha Grayer on 03/27/2015 15:46:37 Borrero, Rhina D. ([id number]) -------------------------------------------------------------------------------- Debridement Details Patient Name: [name], [name] D. Date of Service: 03/27/2015 1:00 PM Medical Record Patient Account Number: [id number] Number: Treating RN: Montey Hora 1961-04-12 (54 y.o. Other Clinician: Date of Birth/Sex: Female) Treating BURNS III, Primary Care Physician: Sharin Mons Physician/Extender: Thayer Jew Referring Physician: Delman Kitten Weeks in Treatment: 0 Debridement Performed for Wound #1 Right,Anterior Lower Leg Assessment: Performed By: Physician BURNS III, Teressa Senter., MD Debridement: Debridement Pre-procedure Yes Verification/Time Out Taken: Start Time: 13:58 Pain Control: Lidocaine 4% Topical Solution Level: Skin/Subcutaneous Tissue Total Area Debrided (L x 2.7 (cm) x 3.7 (cm) = 9.99 (cm) W): Tissue and other Viable, Non-Viable, Eschar, Fibrin/Slough, Subcutaneous material debrided: Instrument: Curette Bleeding: Minimum Hemostasis Achieved: Pressure End Time: 14:01 Procedural Pain: 5 Post Procedural Pain: 0 Response to Treatment: Procedure was tolerated well Post Debridement Measurements of Total Wound Length: (cm) 2.7 Width: (cm) 3.7 Depth: (cm) 0.4 Volume:  (cm) 3.138 Post Procedure Diagnosis Same as Pre-procedure Electronic Signature(s) Signed: 03/27/2015 4:02:17 PM By: Loletha Grayer MD Signed: 03/28/2015 5:13:06 PM By: Montey Hora Entered By: Montey Hora on 03/27/2015 14:01:57 Stipe, [name] ([id number]) -------------------------------------------------------------------------------- HPI Details Patient Name: [name], [name] D. Date of Service: 03/27/2015 1:00 PM Medical Record Patient Account Number: [id number] Number: Treating RN: Montey Hora 1961-05-22 (54 y.o. Other Clinician: Date of Birth/Sex: Female) Treating BURNS III, Primary Care Physician: Sharin Mons Physician/Extender: Thayer Jew Referring Physician: Delman Kitten Weeks in Treatment: 0 History of Present Illness HPI Description: Pleasant 54 year old with multiple medical problems including chronic venous insufficiency, adrenal insufficiency (on prednisone 10 mg daily), COPD, and congestive heart failure. No history of diabetes or peripheral arterial disease. She says that she traumatized her right leg in late August 2016 and suffered a skin tear. This has progressively worsened. She developed cellulitis, which is improving on clindamycin. Not performing any dressing changes. Ambulating per her baseline. No claudication or rest pain. X-ray 03/20/2015 showed no evidence for fracture or osteomyelitis. She reports moderate to severe pain with pressure. No fever or chills. Minimal drainage. Electronic Signature(s) Signed: 03/27/2015 4:02:17 PM By: Loletha Grayer MD Entered By: Loletha Grayer on 03/27/2015 15:50:24 [name] ([id number]) -------------------------------------------------------------------------------- Physical Exam Details Patient Name: [name], [name] D. Date of Service: 03/27/2015 1:00 PM Medical Record Patient Account Number: [id number] Number: Treating RN: Montey Hora [birth year] (54 y.o. Other Clinician: Date of  Birth/Sex: Female) Treating BURNS III, Primary Care Physician: Sharin Mons Physician/Extender: Thayer Jew Referring Physician: Delman Kitten Weeks in Treatment: 0 Constitutional . Pulse regular. Respirations normal and unlabored. Afebrile. Marland Kitchen Respiratory WNL. No retractions.. Cardiovascular Pedal Pulses WNL. Integumentary (Hair, Skin) .Marland Kitchen Neurological Sensation normal to touch, pin,and vibration. Psychiatric Judgement and insight Intact.. Oriented times 3.. No evidence of depression, anxiety, or agitation.. Notes Right pretibial ulceration debrided to subcutaneous fat. No exposed deep structures. No probe to bone, but close. Faint surrounding cellulitis. 1+ pitting edema. Palpable DP. ABI not obtained secondary to painful ulceration. Electronic Signature(s) Signed: 03/27/2015  4:02:17 PM By: Loletha Grayer MD Entered By: Loletha Grayer on 03/27/2015 15:49:59 [name], [name] ([id number]) -------------------------------------------------------------------------------- Physician Orders Details Patient Name: [name], [name] D. Date of Service: 03/27/2015 1:00 PM Medical Record Patient Account Number: [id number] Number: Treating RN: Montey Hora [birth year] (54 y.o. Other Clinician: Date of Birth/Sex: Female) Treating BURNS III, Primary Care Physician: Sharin Mons Physician/Extender: Thayer Jew Referring Physician: Delman Kitten Weeks in Treatment: 0 Verbal / Phone Orders: Yes Clinician: Montey Hora Read Back and Verified: Yes Diagnosis Coding Wound Cleansing Wound #1 Right,Anterior Lower Leg o Clean wound with Normal Saline. o May Shower, gently pat wound dry prior to applying new dressing. Anesthetic Wound #1 Right,Anterior Lower Leg o Topical Lidocaine 4% cream applied to wound bed prior to debridement Primary Wound Dressing Wound #1 Right,Anterior Lower Leg o Aquacel Ag Secondary Dressing Wound #1 Right,Anterior Lower Leg o Gauze, ABD and  Kerlix/Conform - secure lightly with coban Dressing Change Frequency Wound #1 Right,Anterior Lower Leg o Change dressing every other day. Follow-up Appointments Wound #1 Right,Anterior Lower Leg o Return Appointment in 1 week. Edema Control Wound #1 Right,Anterior Lower Leg o Tubigrip Home Health Wound #1 [place] for [place] Nurse may visit PRN to address patientos wound care needs. [name], [name] ([id number]) o FACE TO FACE ENCOUNTER: MEDICARE and MEDICAID PATIENTS: I certify that this patient is under my care and that I had a face-to-face encounter that meets the physician face-to-face encounter requirements with this patient on this date. The encounter with the patient was in whole or in part for the following MEDICAL CONDITION: (primary reason for [place]) MEDICAL NECESSITY: I certify, that based on my findings, NURSING services are a medically necessary home health service. HOME BOUND STATUS: I certify that my clinical findings support that this patient is homebound (i.e., Due to illness or injury, pt requires aid of supportive devices such as crutches, cane, wheelchairs, walkers, the use of special transportation or the assistance of another person to leave their place of residence. There is a normal inability to leave the home and doing so requires considerable and taxing effort. Other absences are for medical reasons / religious services and are infrequent or of short duration when for other reasons). o If current dressing causes regression in wound condition, may D/C ordered dressing product/s and apply Normal Saline Moist Dressing daily until next [place] / Other MD appointment. [place] of regression in wound condition at [phone]. o Please direct any NON-WOUND related issues/requests for orders to patient's Primary Care Physician Electronic  Signature(s) Signed: 03/27/2015 4:02:17 PM By: Loletha Grayer MD Signed: 03/28/2015 5:13:06 PM By: Montey Hora Entered By: Montey Hora on 03/27/2015 14:08:57 Jans, Florabel D. ([id number]) -------------------------------------------------------------------------------- Problem List Details Patient Name: [name], GONZALO. Date of Service: 03/27/2015 1:00 PM Medical Record Patient Account Number: [id number] Number: Treating RN: Montey Hora 06-02-1961 (53 y.o. 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 ([id number]) -------------------------------------------------------------------------------- Progress Note/History and Physical Details Patient Name: TEMIMA, KUTSCH D. Date of Service: 03/27/2015 1:00 PM Medical Record Patient Account Number: [id number] Number: Treating RN: Montey Hora 1961/02/21 (53 y.o. Other Clinician: Date of Birth/Sex: Female) Treating BURNS III, Primary Care Physician: Sharin Mons Physician/Extender: Thayer Jew Referring Physician: Delman Kitten Weeks in Treatment: 0 Subjective Chief Complaint Information  obtained from Patient Right calf traumatic ulceration. History of Present Illness (HPI) Pleasant 54 year old with multiple medical problems including chronic venous insufficiency, adrenal insufficiency (on prednisone 10 mg daily), COPD, and congestive heart failure. No history of diabetes or peripheral arterial disease. She says that she traumatized her right leg in late August 2016 and suffered a skin tear. This has progressively worsened. She developed cellulitis, which is improving on clindamycin. Not performing any dressing changes. Ambulating per her baseline. No claudication or rest pain. X-ray 03/20/2015 showed no evidence for fracture or osteomyelitis. She reports moderate to severe pain with pressure. No fever or chills. Minimal drainage. Wound History Patient presents with 1 open wound that has been present for approximately Aug 21. Patient has been treating wound in the following manner: open to air. Laboratory tests have not been performed in the last month. Patient reportedly has not tested positive for an antibiotic resistant organism. Patient reportedly has not tested positive for osteomyelitis. Patient reportedly has had testing performed to evaluate circulation in the legs. Patient History Information obtained from Patient. Allergies Levaquin, codeine, Cymbalta, Egg Derived, guaifenesin, Keflex, Klonopin, Macrobid, penicillin, Sulfa (Sulfonamide Antibiotics), gatifloxacin Family History Cancer - Mother, Diabetes - Father, Siblings, Heart Disease - Mother, Maternal Grandparents, Siblings, Hypertension - Mother, Maternal Grandparents, Siblings, No family history of Hereditary Spherocytosis, Kidney Disease, Lung Disease, Seizures, Stroke, Thyroid Problems, Tuberculosis. [name], [name] ([id number]) Social History Current every day smoker, Marital Status - Single, Alcohol Use - Never, Drug Use - No History, Caffeine Use - Never. Medical History Respiratory Patient has  history of Chronic Obstructive Pulmonary Disease (COPD), Sleep Apnea Cardiovascular Patient has history of Congestive Heart Failure Immunological Patient has history of Raynaud s Musculoskeletal Patient has history of Osteoarthritis Neurologic Patient has history of Neuropathy Medical And Surgical History Notes Eyes macular degeneration Ear/Nose/Mouth/Throat dysphagia Cardiovascular venous insufficiency Neurologic lewy body dementia Review of Systems (ROS) Constitutional Symptoms (General Health) The patient has no complaints or symptoms. Eyes Complains or has symptoms of Glasses / Contacts - glasses. Gastrointestinal The patient has no complaints or symptoms. Endocrine adrenal insufficiency Genitourinary Complains or has symptoms of Incontinence/dribbling. Integumentary (Skin) The patient has no complaints or symptoms. Oncologic The patient has no complaints or symptoms. Objective [name], [name] ([id number]) Constitutional Pulse regular. Respirations normal and unlabored. Afebrile. Vitals Time Taken: 1:22 PM, Height: 64 in, Source: Stated, Weight: 172 lbs, Source: Stated, BMI: 29.5, Temperature: 98.2 F, Pulse: 103 bpm, Respiratory Rate: 18 breaths/min, Blood Pressure: 128/71 mmHg. Respiratory WNL. No retractions.. Cardiovascular Pedal Pulses WNL. Neurological Sensation normal to touch, pin,and vibration. Psychiatric Judgement and insight Intact.. Oriented times 3.. No evidence of depression, anxiety, or agitation.. General Notes: Right pretibial ulceration debrided to subcutaneous fat. No exposed deep structures. No probe to bone, but close. Faint surrounding cellulitis. 1+ pitting edema. Palpable DP. ABI not obtained secondary to painful ulceration. Integumentary (Hair, Skin) Wound #1 status is Open. Original cause of wound was Trauma. The wound is located on the [place]  Leg. The wound measures 2.7cm length x 3.7cm width x 0.2cm depth;  7.846cm^2 area and 1.569cm^3 volume. The wound is limited to skin breakdown. There is no tunneling or undermining noted. There is a medium amount of purulent drainage noted. The wound margin is flat and intact. There is no granulation within the wound bed. There is a large (67-100%) amount of necrotic tissue within the wound bed including Eschar. The periwound skin appearance exhibited: Erythema. The periwound skin appearance did not exhibit: Callus, Crepitus, Excoriation, Fluctuance, Friable, Induration, Localized Edema, Rash, Scarring, Dry/Scaly, Maceration, Moist, Atrophie Blanche, Cyanosis, Ecchymosis, Hemosiderin Staining, Mottled, Pallor, Rubor. The surrounding wound skin color is noted with erythema which is circumferential. Periwound temperature was noted as No Abnormality. The periwound has tenderness on palpation. 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 ([id number]) S81.801A - Unspecified open wound, right lower leg, initial encounter E27.8 - Other specified disorders of adrenal gland Z79.52 - Long term (current) use of systemic steroids J44.9 - Chronic obstructive pulmonary disease, unspecified Right anterior calf traumatic ulceration. Procedures Wound #1 Wound #1 is a Skin Tear located on the Right,Anterior Lower Leg . There was a Skin/Subcutaneous Tissue Debridement ([id number]) debridement with total area of 9.99 sq cm performed by BURNS III, Teressa Senter., MD. with the following instrument(s): Curette to remove Viable and Non-Viable tissue/material including Fibrin/Slough, Eschar, and Subcutaneous after achieving pain control using Lidocaine 4% Topical Solution. A time out was conducted prior to the start of the procedure. A Minimum amount of bleeding was controlled with Pressure. The procedure was tolerated well with a pain  level of 5 throughout and a pain level of 0 following the procedure. Post Debridement Measurements: 2.7cm length x 3.7cm width x 0.4cm depth; 3.138cm^3 volume. Post procedure Diagnosis Wound #1: Same as Pre-Procedure Plan Wound Cleansing: Wound #1 Right,Anterior Lower Leg: Clean wound with Normal Saline. May Shower, gently pat wound dry prior to applying new dressing. Anesthetic: Wound #1 Right,Anterior Lower Leg: Topical Lidocaine 4% cream applied to wound bed prior to debridement Primary Wound Dressing: Wound #1 Right,Anterior Lower Leg: Aquacel Ag Secondary Dressing: Wound #1 Right,Anterior Lower Leg: Gauze, ABD and Kerlix/Conform - secure lightly with coban Dressing Change Frequency: Wound #1 Right,Anterior Lower Leg: Change dressing every other day. Follow-up Appointments: [name], [name] ([id number]) Wound #1 Right,Anterior Lower Leg: Return Appointment in 1 week. Edema Control: Wound #1 Right,Anterior Lower Leg: Tubigrip Home Health: Wound #1 Right,Anterior Lower Leg: [place] for [place] Nurse may visit PRN to address patient s wound care needs. FACE TO FACE ENCOUNTER: MEDICARE and MEDICAID PATIENTS: I certify that this patient is under my care and that I had a face-to-face encounter that meets the physician face-to-face encounter requirements with this patient on this date. The encounter with the patient was in whole or in part for the following MEDICAL CONDITION: (primary reason for [place]) MEDICAL NECESSITY: I certify, that based on my findings, NURSING services are a medically necessary home health service. HOME BOUND STATUS: I certify that my clinical findings support that this patient is homebound (i.e., Due to illness or injury, pt requires aid of supportive devices such as crutches, cane, wheelchairs, walkers, the use of special transportation or the assistance of another person to leave their place of residence. There is  a normal inability to leave the home and doing so requires considerable and taxing effort.  Other absences are for medical reasons / religious services and are infrequent or of short duration when for other reasons). If current dressing causes regression in wound condition, may D/C ordered dressing product/s and apply Normal Saline Moist Dressing daily until next [place] / Other MD appointment. [place] of regression in wound condition at [phone]. Please direct any NON-WOUND related issues/requests for orders to patient's Primary Care Physician Silver alginate. Continue clindamycin. Tubigrip for edema control. We'll consider compression bandages once infection resolved. Electronic Signature(s) Signed: 03/27/2015 4:02:17 PM By: Loletha Grayer MD Entered By: Loletha Grayer on 03/27/2015 15:51:41 Detjen, Raechal D. ([id number]) -------------------------------------------------------------------------------- ROS/PFSH Details Patient Name: MAKI, HEGE D. Date of Service: 03/27/2015 1:00 PM Medical Record Patient Account Number: [id number] Number: Treating RN: Montey Hora 12-Jun-1961 (53 y.o. Other Clinician: Date of Birth/Sex: Female) Treating BURNS III, Primary Care Physician: Sharin Mons Physician/Extender: Thayer Jew Referring Physician: Delman Kitten Weeks in Treatment: 0 Label Progress Note Print Version as History and Physical for this encounter Information Obtained From Patient Wound History Do you currently have one or more open woundso Yes How many open wounds do you currently haveo 1 Approximately how long have you had your woundso Aug 21 How have you been treating your wound(s) until nowo open to air Has your wound(s) ever healed and then re-openedo No Have you had any lab work done in the past montho No Have you tested positive for an antibiotic resistant organism (MRSA, VRE)o No Have you tested positive for osteomyelitis (bone  infection)o No Have you had any tests for circulation on your legso Yes Where was the test doneo [facility] 2009 Eyes Complaints and Symptoms: Positive for: Glasses / Contacts - glasses Medical History: Past Medical History Notes: macular degeneration Genitourinary Complaints and Symptoms: Positive for: Incontinence/dribbling Constitutional Symptoms (General Health) Complaints and Symptoms: No Complaints or Symptoms Ear/Nose/Mouth/Throat Medical History: Past Medical History Notes: dysphagia TYKERIA, WAWRZYNIAK ([id number]) Respiratory Medical History: Positive for: Chronic Obstructive Pulmonary Disease (COPD); Sleep Apnea Cardiovascular Medical History: Positive for: Congestive Heart Failure Past Medical History Notes: venous insufficiency Gastrointestinal Complaints and Symptoms: No Complaints or Symptoms Endocrine Complaints and Symptoms: Review of System Notes: adrenal insufficiency Immunological Medical History: Positive for: Raynaudos Integumentary (Skin) Complaints and Symptoms: No Complaints or Symptoms Musculoskeletal Medical History: Positive for: Osteoarthritis Neurologic Medical History: Positive for: Neuropathy Past Medical History Notes: lewy body dementia Oncologic Complaints and Symptoms: No Complaints or Symptoms Immunizations Immunization NotesTHERASA, LORENZI ([id number]) up to date Family and Social History Cancer: Yes - Mother; Diabetes: Yes - Father, Siblings; Heart Disease: Yes - Mother, Maternal Grandparents, Siblings; Hereditary Spherocytosis: No; Hypertension: Yes - Mother, Maternal Grandparents, Siblings; Kidney Disease: No; Lung Disease: No; Seizures: No; Stroke: No; Thyroid Problems: No; Tuberculosis: No; Current every day smoker; Marital Status - Single; Alcohol Use: Never; Drug Use: No History; Caffeine Use: Never; Financial Concerns: No; Food, Clothing or Shelter Needs: No; Support System Lacking: No; Transportation Concerns: No;  Living Will: Yes (Copy provided); Medical Power of Attorney: Yes - Richardson Landry and Jorja Loa (Copy provided) Physician Affirmation I have reviewed and agree with the above information. Electronic Signature(s) Signed: 03/27/2015 4:02:17 PM By: Loletha Grayer MD Signed: 03/28/2015 5:13:06 PM By: Montey Hora Entered By: Loletha Grayer on 03/27/2015 15:50:44 Humphreys, Virdell D. ([id number]) -------------------------------------------------------------------------------- SuperBill Details Patient Name: ANDRIEA, HASEGAWA D. Date of Service: 03/27/2015 Medical Record Patient Account Number: [id number] Number: Treating RN: Montey Hora 07-17-1961 (53 y.o. 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 I87.2 Venous insufficiency (chronic) (peripheral) S81.801A Unspecified open wound, right lower leg, initial encounter E27.8 Other specified disorders of adrenal gland Z79.52 Long term (current) use of systemic steroids J44.9 Chronic obstructive pulmonary disease, unspecified L03.115 Cellulitis of right lower limb Facility Procedures CPT4: Description Modifier Quantity Code [id number] 99213 - WOUND CARE VISIT-LEV 3 EST PT 1 CPT4: [id number] 11042 - DEB SUBQ TISSUE 20 SQ CM/< 1 ICD-10 Description Diagnosis 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

## 2015-03-29 NOTE — Progress Notes (Signed)
[name] ([id number]) Visit Report for 03/27/2015 Abuse/Suicide Risk Screen Details Patient Name: [name], [name]. Date of Service: 03/27/2015 1:00 PM Medical Record Patient Account Number: [id number] Number: Treating RN: Montey Hora [birth year] (53 y.o. Other Clinician: Date of Birth/Sex: Female) Treating BURNS III, Primary Care Physician: Sharin Mons Physician/Extender: Thayer Jew Referring Physician: Delman Kitten Weeks in Treatment: 0 Abuse/Suicide Risk Screen Items Answer ABUSE/SUICIDE RISK SCREEN: Has anyone close to you tried to hurt or harm you recentlyo No Do you feel uncomfortable with anyone in your familyo No Has anyone forced you do things that you didnot want to doo No Do you have any thoughts of harming yourselfo No Patient displays signs or symptoms of abuse and/or neglect. No Electronic Signature(s) Signed: 03/28/2015 5:13:06 PM By: Montey Hora Entered By: Montey Hora on 03/27/2015 13:36:17 Meanor, Yeilyn D. ([id number]) -------------------------------------------------------------------------------- Activities of Daily Living Details Patient Name: [name], [name] Date of Service: 03/27/2015 1:00 PM Medical Record Patient Account Number: [id number] Number: Treating RN: Montey Hora [birth year] (53 y.o. Other Clinician: Date of Birth/Sex: Female) Treating BURNS III, Primary Care Physician: Sharin Mons Physician/Extender: Thayer Jew Referring Physician: Delman Kitten Weeks in Treatment: 0 Activities of Daily Living Items Answer Activities of Daily Living (Please select one for each item) Drive Automobile Not Able Take Medications Completely Able Use Telephone Completely Able Care for Appearance Completely Able Use Toilet Completely Able Bath / Shower Completely Able Dress Self Completely Able Feed Self Completely Able Walk Completely Able Get In / Out Bed Completely Able Housework Completely Able Prepare Meals Completely  [place] for Self Need Assistance Electronic Signature(s) Signed: 03/28/2015 5:13:06 PM By: Montey Hora Entered By: Montey Hora on 03/27/2015 13:36:44 Dentler, Andrya D. ([id number]) -------------------------------------------------------------------------------- Education Assessment Details Patient Name: [name], [name] Date of Service: 03/27/2015 1:00 PM Medical Record Patient Account Number: [id number] Number: Treating RN: Montey Hora [birth year] (53 y.o. Other Clinician: Date of Birth/Sex: Female) Treating BURNS III, Primary Care Physician: Sharin Mons Physician/Extender: Thayer Jew Referring Physician: Samuel Germany in Treatment: 0 Primary Learner Assessed: Patient Learning Preferences/Education Level/Primary Language Learning Preference: Explanation, Demonstration Highest Education Level: High School Preferred Language: English Cognitive Barrier Assessment/Beliefs Language Barrier: No Translator Needed: No Memory Deficit: No Emotional Barrier: No Cultural/Religious Beliefs Affecting Medical No Care: Physical Barrier Assessment Impaired Vision: No Impaired Hearing: No Decreased Hand dexterity: No Knowledge/Comprehension Assessment Knowledge Level: Medium Comprehension Level: Medium Ability to understand written Medium instructions: Ability to understand verbal Medium instructions: Motivation Assessment Anxiety Level: Calm Cooperation: Cooperative Education Importance: Acknowledges Need Interest in Health Problems: Asks Questions Perception: Coherent Willingness to Engage in Self- Medium Management Activities: Readiness to Engage in Self- Medium Management Activities: ZARIYAH, STEPHENS ([id number]) Electronic Signature(s) Signed: 03/28/2015 5:13:06 PM By: Montey Hora Entered By: Montey Hora on 03/27/2015 13:37:06 ROZALYNN, BUEGE  ([id number]) -------------------------------------------------------------------------------- Fall Risk Assessment Details Patient Name: Kristina Maw D. Date of Service: 03/27/2015 1:00 PM Medical Record Patient Account Number: [id number] Number: Treating RN: Montey Hora Nov 21, 1960 (53 y.o. Other Clinician: Date of Birth/Sex: Female) Treating BURNS III, Primary Care Physician: Sharin Mons Physician/Extender: Thayer Jew Referring Physician: Delman Kitten Weeks in Treatment: 0 Fall Risk Assessment Items FALL RISK ASSESSMENT: History of falling - immediate or within 3 months 25 Yes Secondary diagnosis 0 No Ambulatory aid None/bed rest/wheelchair/nurse 0 Yes Crutches/cane/walker 15 Yes Furniture 0 No IV Access/Saline Lock 0 No Gait/Training Normal/bed rest/immobile 0 Yes Weak 10 Yes Impaired 0 No Mental Status Oriented to own ability 0 Yes Electronic  Signature(s) Signed: 03/28/2015 5:13:06 PM By: Montey Hora Entered By: Montey Hora on 03/27/2015 13:37:30 Abruzzo, Jaylyne D. ([id number]) -------------------------------------------------------------------------------- Foot Assessment Details Patient Name: [name], [name] Date of Service: 03/27/2015 1:00 PM Medical Record Patient Account Number: [id number] Number: Treating RN: Montey Hora [birth year] (53 y.o. Other Clinician: Date of Birth/Sex: Female) Treating BURNS III, Primary Care Physician: Sharin Mons Physician/Extender: Thayer Jew Referring Physician: Delman Kitten Weeks in Treatment: 0 Foot Assessment Items Site Locations + = Sensation present, - = Sensation absent, C = Callus, U = Ulcer R = Redness, W = Warmth, M = Maceration, PU = Pre-ulcerative lesion F = Fissure, S = Swelling, D = Dryness Assessment Right: Left: Other Deformity: No No Prior Foot Ulcer: No No Prior Amputation: No No Charcot Joint: No No Ambulatory Status: Ambulatory With Help Assistance Device: Cane Gait:  Buyer, retail Signature(s) Signed: 03/28/2015 5:13:06 PM By: Montey Hora Entered By: Montey Hora on 03/27/2015 13:50:18 Delauter, Toya D. ([id number]) Clydene Laming, [place]. ([id number]) -------------------------------------------------------------------------------- Nutrition Risk Assessment Details Patient Name: [name], [name] Date of Service: 03/27/2015 1:00 PM Medical Record Patient Account Number: [id number] Number: Treating RN: Montey Hora [birth year] (53 y.o. Other Clinician: Date of Birth/Sex: Female) Treating BURNS III, Primary Care Physician: Sharin Mons Physician/Extender: Thayer Jew Referring Physician: Delman Kitten Weeks in Treatment: 0 Height (in): 64 Weight (lbs): 172 Body Mass Index (BMI): 29.5 Nutrition Risk Assessment Items NUTRITION RISK SCREEN: I have an illness or condition that made me change the kind and/or 0 No amount of food I eat I eat fewer than two meals per day 0 No I eat few fruits and vegetables, or milk products 0 No I have three or more drinks of beer, liquor or wine almost every day 0 No I have tooth or mouth problems that make it hard for me to eat 0 No I don't always have enough money to buy the food I need 0 No I eat alone most of the time 0 No I take three or more different prescribed or over-the-counter drugs a 1 Yes day Without wanting to, I have lost or gained 10 pounds in the last six 0 No months I am not always physically able to shop, cook and/or feed myself 0 No Nutrition Protocols Good Risk Protocol 0 No interventions needed Moderate Risk Protocol Electronic Signature(s) Signed: 03/28/2015 5:13:06 PM By: Montey Hora Entered By: Montey Hora on 03/27/2015 13:37:37

## 2015-04-03 ENCOUNTER — Encounter: Payer: Medicaid Other | Admitting: Surgery

## 2015-04-03 DIAGNOSIS — L97211 Non-pressure chronic ulcer of right calf limited to breakdown of skin: Secondary | ICD-10-CM | POA: Diagnosis not present

## 2015-04-03 NOTE — Progress Notes (Addendum)
[name] ([id number]) Visit Report for 04/03/2015 Arrival Information Details Patient Name: [name], [name]. Date of Service: 04/03/2015 2:30 PM Medical Record Number: [id number] Patient Account Number: [id number] Date of Birth/Sex: 1961/04/04 (54 y.o. Female) Treating RN: Afful, RN, BSN, Velva Harman Primary Care Physician: Townsend Roger Other Clinician: Referring Physician: Townsend Roger Treating Physician/Extender: BURNS III, Charlean Sanfilippo in Treatment: 1 Visit Information History Since Last Visit Added or deleted any medications: No Patient Arrived: Cane Any new allergies or adverse reactions: No Arrival Time: 14:44 Had a fall or experienced change in No Accompanied By: brother activities of daily living that may affect Transfer Assistance: Manual risk of falls: Patient Identification Verified: Yes Signs or symptoms of abuse/neglect since last No Secondary Verification Process Completed: Yes visito Patient Requires Transmission-Based No Hospitalized since last visit: No Precautions: Has Dressing in Place as Prescribed: Yes Patient Has Alerts: No Pain Present Now: No Electronic Signature(s) Signed: 04/04/2015 8:38:05 AM By: Regan Lemming BSN, RN Previous Signature: 04/03/2015 2:45:57 PM Version By: Regan Lemming BSN, RN Entered By: Regan Lemming on 04/04/2015 08:38:04 Badders, [name] ([id number]) -------------------------------------------------------------------------------- Clinic Level of Care Assessment Details Patient Name: [name], [name] Date of Service: 04/03/2015 2:30 PM Medical Record Number: [id number] Patient Account Number: [id number] Date of Birth/Sex: [birth year] (54 y.o. Female) Treating RN: Afful, RN, BSN, Velva Harman Primary Care Physician: Townsend Roger Other Clinician: Referring Physician: Townsend Roger Treating Physician/Extender: BURNS III, Charlean Sanfilippo in Treatment: 1 Clinic Level of Care Assessment Items TOOL 4 Quantity Score []  - Use when only an EandM is  performed on FOLLOW-UP visit 0 ASSESSMENTS - Nursing Assessment / Reassessment X - Reassessment of Co-morbidities (includes updates in patient status) 1 10 X - Reassessment of Adherence to Treatment Plan 1 5 ASSESSMENTS - Wound and Skin Assessment / Reassessment X - Simple Wound Assessment / Reassessment - one wound 1 5 []  - Complex Wound Assessment / Reassessment - multiple wounds 0 []  - Dermatologic / Skin Assessment (not related to wound area) 0 ASSESSMENTS - Focused Assessment []  - Circumferential Edema Measurements - multi extremities 0 []  - Nutritional Assessment / Counseling / Intervention 0 X - Lower Extremity Assessment (monofilament, tuning fork, pulses) 1 5 []  - Peripheral Arterial Disease Assessment (using hand held doppler) 0 ASSESSMENTS - Ostomy and/or Continence Assessment and Care []  - Incontinence Assessment and Management 0 []  - Ostomy Care Assessment and Management (repouching, etc.) 0 PROCESS - Coordination of Care X - Simple Patient / Family Education for ongoing care 1 15 []  - Complex (extensive) Patient / Family Education for ongoing care 0 []  - Staff obtains Programmer, systems, Records, Test Results / Process Orders 0 []  - Staff telephones HHA, Nursing Homes / Clarify orders / etc 0 []  - Routine Transfer to another Facility (non-emergent condition) 0 [name], [name] ([id number]) []  - Routine Hospital Admission (non-emergent condition) 0 []  - New Admissions / Biomedical engineer / Ordering NPWT, Apligraf, etc. 0 []  - Emergency Hospital Admission (emergent condition) 0 []  - Simple Discharge Coordination 0 []  - Complex (extensive) Discharge Coordination 0 PROCESS - Special Needs []  - Pediatric / Minor Patient Management 0 []  - Isolation Patient Management 0 []  - Hearing / Language / Visual special needs 0 []  - Assessment of Community assistance (transportation, D/C planning, etc.) 0 []  - Additional assistance / Altered mentation 0 []  - Support Surface(s) Assessment  (bed, cushion, seat, etc.) 0 INTERVENTIONS - Wound Cleansing / Measurement X - Simple Wound Cleansing - one wound 1 5 []  -  Complex Wound Cleansing - multiple wounds 0 X - Wound Imaging (photographs - any number of wounds) 1 5 []  - Wound Tracing (instead of photographs) 0 X - Simple Wound Measurement - one wound 1 5 []  - Complex Wound Measurement - multiple wounds 0 INTERVENTIONS - Wound Dressings X - Small Wound Dressing one or multiple wounds 1 10 []  - Medium Wound Dressing one or multiple wounds 0 []  - Large Wound Dressing one or multiple wounds 0 []  - Application of Medications - topical 0 []  - Application of Medications - injection 0 INTERVENTIONS - Miscellaneous []  - External ear exam 0 [name], [name] ([id number]) []  - Specimen Collection (cultures, biopsies, blood, body fluids, etc.) 0 []  - Specimen(s) / Culture(s) sent or taken to Lab for analysis 0 []  - Patient Transfer (multiple staff / Harrel Lemon Lift / Similar devices) 0 []  - Simple Staple / Suture removal (25 or less) 0 []  - Complex Staple / Suture removal (26 or more) 0 []  - Hypo / Hyperglycemic Management (close monitor of Blood Glucose) 0 []  - Ankle / Brachial Index (ABI) - do not check if billed separately 0 X - Vital Signs 1 5 Has the patient been seen at the hospital within the last three years: Yes Total Score: 70 Level Of Care: New/Established - Level 2 Electronic Signature(s) Signed: 04/03/2015 3:00:33 PM By: Regan Lemming BSN, RN Entered By: Regan Lemming on 04/03/2015 15:00:33 [name], [name] ([id number]) -------------------------------------------------------------------------------- Encounter Discharge Information Details Patient Name: [name], [name] Date of Service: 04/03/2015 2:30 PM Medical Record Number: [id number] Patient Account Number: [id number] Date of Birth/Sex: [birth year] (54 y.o. Female) Treating RN: Afful, RN, BSN, Velva Harman Primary Care Physician: Townsend Roger Other Clinician: Referring Physician: Townsend Roger Treating Physician/Extender: BURNS III, Charlean Sanfilippo in Treatment: 1 Encounter Discharge Information Items Discharge Pain Level: 0 Discharge Condition: Stable Ambulatory Status: Cane Discharge Destination: Home Private Transportation: Auto Accompanied By: friend Schedule Follow-up Appointment: No Medication Reconciliation completed and No provided to Patient/Care Jeraldean Wechter: Clinical Summary of Care: Electronic Signature(s) Signed: 04/03/2015 3:09:10 PM By: Regan Lemming BSN, RN Previous Signature: 04/03/2015 3:07:10 PM Version By: Ruthine Dose Previous Signature: 04/03/2015 3:06:02 PM Version By: Regan Lemming BSN, RN Entered By: Regan Lemming on 04/03/2015 15:09:10 Donoso, [name] ([id number]) -------------------------------------------------------------------------------- Lower Extremity Assessment Details Patient Name: [name], [name] Date of Service: 04/03/2015 2:30 PM Medical Record Number: [id number] Patient Account Number: [id number] Date of Birth/Sex: 1961/01/17 (54 y.o. Female) Treating RN: Afful, RN, BSN, Velva Harman Primary Care Physician: Townsend Roger Other Clinician: Referring Physician: Townsend Roger Treating Physician/Extender: BURNS III, WALTER Weeks in Treatment: 1 Edema Assessment Assessed: [Left: No] [Right: No] Edema: [Left: Ye] [Right: s] Calf Left: Right: Point of Measurement: 30 cm From Medial Instep cm 38 cm Ankle Left: Right: Point of Measurement: 10 cm From Medial Instep cm 21 cm Vascular Assessment Claudication: Claudication Assessment [Right:None] Pulses: Posterior Tibial Dorsalis Pedis Palpable: [Right:Yes] Extremity colors, hair growth, and conditions: Extremity Color: [Right:Normal] Hair Growth on Extremity: [Right:No] Temperature of Extremity: [Right:Warm] Capillary Refill: [Right:< 3 seconds] Toe Nail Assessment Left: Right: Thick: No Discolored: No Deformed: No Improper Length and Hygiene: No Electronic Signature(s) Signed:  04/03/2015 2:50:09 PM By: Regan Lemming BSN, RN Entered By: Regan Lemming on 04/03/2015 14:50:09 Parke, Evaleen D. ([id number]) Spahr, Makhia D. ([id number]) -------------------------------------------------------------------------------- Multi Wound Chart Details Patient Name: [name], [name] Date of Service: 04/03/2015 2:30 PM Medical Record Number: [id number] Patient Account Number: [id number] Date of Birth/Sex: March 09, 1961 (54 y.o. Female) Treating  RN: Baruch Gouty, RN, BSN, Velva Harman Primary Care Physician: Townsend Roger Other Clinician: Referring Physician: Townsend Roger Treating Physician/Extender: BURNS III, WALTER Weeks in Treatment: 1 Vital Signs Height(in): 64 Pulse(bpm): 102 Weight(lbs): 172 Blood Pressure 131/69 (mmHg): Body Mass Index(BMI): 30 Temperature(F): 98.8 Respiratory Rate 18 (breaths/min): Photos: [1:No Photos] [N/A:N/A] Wound Location: [1:Right Lower Leg - Anterior N/A] Wounding Event: [1:Trauma] [N/A:N/A] Primary Etiology: [1:Skin Tear] [N/A:N/A] Comorbid History: [1:Chronic Obstructive Pulmonary Disease (COPD), Sleep Apnea, Congestive Heart Failure, Raynaudos, Osteoarthritis, Neuropathy] [N/A:N/A] Date Acquired: [1:03/10/2015] [N/A:N/A] Weeks of Treatment: [1:1] [N/A:N/A] Wound Status: [1:Open] [N/A:N/A] Measurements L x W x D 2.1x2.5x0.2 [N/A:N/A] (cm) Area (cm) : [1:4.123] [N/A:N/A] Volume (cm) : [1:0.825] [N/A:N/A] % Reduction in Area: [1:47.50%] [N/A:N/A] % Reduction in Volume: 47.40% [N/A:N/A] Classification: [1:Full Thickness Without Exposed Support Structures] [N/A:N/A] Exudate Amount: [1:Medium] [N/A:N/A] Exudate Type: [1:Serosanguineous] [N/A:N/A] Exudate Color: [1:red, brown] [N/A:N/A] Wound Margin: [1:Flat and Intact] [N/A:N/A] Granulation Amount: [1:Medium (34-66%)] [N/A:N/A] Necrotic Amount: [1:Medium (34-66%)] [N/A:N/A] Necrotic Tissue: [1:Eschar, Adherent Slough N/A] Exposed Structures: [N/A:N/A] Fascia: No Fat: No Tendon: No Muscle:  No Joint: No Bone: No Limited to Skin Breakdown Epithelialization: None N/A N/A Periwound Skin Texture: Edema: Yes N/A N/A Excoriation: No Induration: No Callus: No Crepitus: No Fluctuance: No Friable: No Rash: No Scarring: No Periwound Skin Moist: Yes N/A N/A Moisture: Maceration: No Dry/Scaly: No Periwound Skin Color: Erythema: Yes N/A N/A Atrophie Blanche: No Cyanosis: No Ecchymosis: No Hemosiderin Staining: No Mottled: No Pallor: No Rubor: No Erythema Location: Circumferential N/A N/A Temperature: No Abnormality N/A N/A Tenderness on Yes N/A N/A Palpation: Wound Preparation: Ulcer Cleansing: N/A N/A Rinsed/Irrigated with Saline Topical Anesthetic Applied: Other: lidocaine 4% Treatment Notes Electronic Signature(s) Signed: 04/03/2015 2:56:08 PM By: Regan Lemming BSN, RN Entered By: Regan Lemming on 04/03/2015 14:56:08 [name], Kristina DMarland Kitchen ([id number]) -------------------------------------------------------------------------------- Multi-Disciplinary Care Plan Details Patient Name: [name], [name] Date of Service: 04/03/2015 2:30 PM Medical Record Number: [id number] Patient Account Number: [id number] Date of Birth/Sex: 1960/10/13 (54 y.o. Female) Treating RN: Afful, RN, BSN, Velva Harman Primary Care Physician: Townsend Roger Other Clinician: Referring Physician: Townsend Roger Treating Physician/Extender: BURNS III, Charlean Sanfilippo in Treatment: 1 Active Inactive Abuse / Safety / Falls / Self Care Management Nursing Diagnoses: Impaired physical mobility Potential for falls Goals: Patient will remain injury free Date Initiated: 03/27/2015 Goal Status: Active Interventions: Assess fall risk on admission and as needed Notes: Orientation to the Wound Care Program Nursing Diagnoses: Knowledge deficit related to the wound healing center program Goals: Patient/caregiver will verbalize understanding of the [place] Program Date Initiated: 03/27/2015 Goal Status:  Active Interventions: Provide education on orientation to the wound center Notes: Wound/Skin Impairment Nursing Diagnoses: Impaired tissue integrity Goals: Ulcer/skin breakdown will have a volume reduction of 30% by week 4 GLORINE, HANRATTY ([id number]) Date Initiated: 03/27/2015 Goal Status: Active Ulcer/skin breakdown will have a volume reduction of 50% by week 8 Date Initiated: 03/27/2015 Goal Status: Active Ulcer/skin breakdown will have a volume reduction of 80% by week 12 Date Initiated: 03/27/2015 Goal Status: Active Ulcer/skin breakdown will heal within 14 weeks Date Initiated: 03/27/2015 Goal Status: Active Interventions: Assess ulceration(s) every visit Notes: Electronic Signature(s) Signed: 04/03/2015 2:53:12 PM By: Regan Lemming BSN, RN Entered By: Regan Lemming on 04/03/2015 14:53:12 [place], [place]. ([id number]) -------------------------------------------------------------------------------- Pain Assessment Details Patient Name: [name], [name] Date of Service: 04/03/2015 2:30 PM Medical Record Number: [id number] Patient Account Number: [id number] Date of Birth/Sex: 1960-08-22 (54 y.o. Female) Treating RN: Afful, RN, BSN, [facility] Primary Care Physician:  Townsend Roger Other Clinician: Referring Physician: Townsend Roger Treating Physician/Extender: BURNS III, WALTER Weeks in Treatment: 1 Active Problems Location of Pain Severity and Description of Pain Patient Has Paino No Site Locations Pain Management and Medication Current Pain Management: Electronic Signature(s) Signed: 04/03/2015 2:46:04 PM By: Regan Lemming BSN, RN Entered By: Regan Lemming on 04/03/2015 14:46:03 Menon, Shamina D. ([id number]) -------------------------------------------------------------------------------- Patient/Caregiver Education Details Patient Name: MAGIE, CIAMPA D. Date of Service: 04/03/2015 2:30 PM Medical Record Number: [id number] Patient Account Number: [id number] Date of Birth/Gender: July 02, 1961 (54  y.o. Female) Treating RN: Baruch Gouty, RN, BSN, Velva Harman Primary Care Physician: Townsend Roger Other Clinician: Referring Physician: Townsend Roger Treating Physician/Extender: Sharlet Salina in Treatment: 1 Education Assessment Education Provided To: Patient Education Topics Provided Welcome To [facility]: Methods: Explain/Verbal Responses: State content correctly Electronic Signature(s) Signed: 04/03/2015 3:09:19 PM By: Regan Lemming BSN, RN Entered By: Regan Lemming on 04/03/2015 15:09:19 TRULY, STANKIEWICZ D. ([id number]) -------------------------------------------------------------------------------- Wound Assessment Details Patient Name: [name], [name] Date of Service: 04/03/2015 2:30 PM Medical Record Number: [id number] Patient Account Number: [id number] Date of Birth/Sex: 01/09/1961 (54 y.o. Female) Treating RN: Afful, RN, BSN, Velva Harman Primary Care Physician: Townsend Roger Other Clinician: Referring Physician: Townsend Roger Treating Physician/Extender: BURNS III, WALTER Weeks in Treatment: 1 Wound Status Wound Number: 1 Primary Skin Tear Etiology: Wound Location: Right Lower Leg - Anterior Wound Open Wounding Event: Trauma Status: Date Acquired: 03/10/2015 Comorbid Chronic Obstructive Pulmonary Disease Weeks Of Treatment: 1 History: (COPD), Sleep Apnea, Congestive Clustered Wound: No Heart Failure, Raynaudos, Osteoarthritis, Neuropathy Photos Photo Uploaded By: Regan Lemming on 04/03/2015 16:45:38 Wound Measurements Length: (cm) 2.1 Width: (cm) 2.5 Depth: (cm) 0.2 Area: (cm) 4.123 Volume: (cm) 0.825 % Reduction in Area: 47.5% % Reduction in Volume: 47.4% Epithelialization: None Tunneling: No Undermining: No Wound Description Full Thickness Without Exposed Classification: Support Structures Wound Margin: Flat and Intact Exudate Medium Amount: Exudate Type: Serosanguineous Exudate Color: red, brown Foul Odor After Cleansing: No Wound  Bed Granulation Amount: Medium (34-66%) Exposed Structure Mareno, Seila D. ([id number]) Necrotic Amount: Medium (34-66%) Fascia Exposed: No Necrotic Quality: Eschar, Adherent Slough Fat Layer Exposed: No Tendon Exposed: No Muscle Exposed: No Joint Exposed: No Bone Exposed: No Limited to Skin Breakdown Periwound Skin Texture Texture Color No Abnormalities Noted: No No Abnormalities Noted: No Callus: No Atrophie Blanche: No Crepitus: No Cyanosis: No Excoriation: No Ecchymosis: No Fluctuance: No Erythema: Yes Friable: No Erythema Location: Circumferential Induration: No Hemosiderin Staining: No Localized Edema: Yes Mottled: No Rash: No Pallor: No Scarring: No Rubor: No Moisture Temperature / Pain No Abnormalities Noted: No Temperature: No Abnormality Dry / Scaly: No Tenderness on Palpation: Yes Maceration: No Moist: Yes Wound Preparation Ulcer Cleansing: Rinsed/Irrigated with Saline Topical Anesthetic Applied: Other: lidocaine 4%, Treatment Notes Wound #1 (Right, Anterior Lower Leg) 1. Cleansed with: Clean wound with Normal Saline 4. Dressing Applied: Aquacel Ag 5. Secondary Dressing Applied Gauze and Kerlix/Conform 7. Secured with Financial risk analyst) Signed: 04/03/2015 2:52:54 PM By: Regan Lemming BSN, RN Entered By: Regan Lemming on 04/03/2015 14:52:54 Heikkila, [name] ([id number]) -------------------------------------------------------------------------------- Vitals Details Patient Name: ALEEAH, GREENO D. Date of Service: 04/03/2015 2:30 PM Medical Record Number: [id number] Patient Account Number: [id number] Date of Birth/Sex: Feb 27, 1961 (54 y.o. Female) Treating RN: Afful, RN, BSN, Velva Harman Primary Care Physician: Townsend Roger Other Clinician: Referring Physician: Townsend Roger Treating Physician/Extender: BURNS III, WALTER Weeks in Treatment: 1 Vital Signs Time Taken: 14:46 Temperature (F): 98.8 Height (in): 64 Pulse (bpm):  102 Weight  (lbs): 172 Respiratory Rate (breaths/min): 18 Body Mass Index (BMI): 29.5 Blood Pressure (mmHg): 131/69 Reference Range: 80 - 120 mg / dl Electronic Signature(s) Signed: 04/03/2015 2:46:24 PM By: Regan Lemming BSN, RN Entered By: Regan Lemming on 04/03/2015 14:46:24

## 2015-04-04 NOTE — Progress Notes (Signed)
ASRA, GAMBREL ([id number]) Visit Report for 04/03/2015 Chief Complaint Document Details Patient Name: [name], [name]. Date of Service: 04/03/2015 2:30 PM Medical Record Patient Account Number: [id number] Number: Treating RN: Baruch Gouty, RN, BSN, Rita 14-Oct-1960 [phone] y.o. Other Clinician: Date of Birth/Sex: Female) Treating BURNS III, Primary Care Physician: Townsend Roger Physician/Extender: Thayer Jew Referring Physician: Townsend Roger Weeks in Treatment: 1 Information Obtained from: Patient Chief Complaint Right calf traumatic ulceration. Electronic Signature(s) Signed: 04/03/2015 4:56:52 PM By: Loletha Grayer MD Entered By: Loletha Grayer on 04/03/2015 15:04:23 [name], [name] ([id number]) -------------------------------------------------------------------------------- HPI Details Patient Name: [name], [name] Date of Service: 04/03/2015 2:30 PM Medical Record Patient Account Number: [id number] Number: Treating RN: Baruch Gouty, RN, BSN, Rita 1961/02/05 [phone] y.o. Other Clinician: Date of Birth/Sex: Female) Treating BURNS III, Primary Care Physician: Townsend Roger Physician/Extender: Thayer Jew Referring Physician: Townsend Roger Weeks in Treatment: 1 History of Present Illness HPI Description: Pleasant 54 year old with multiple medical problems including chronic venous insufficiency, adrenal insufficiency (on prednisone 10 mg daily), COPD, and congestive heart failure. No history of diabetes or peripheral arterial disease. She traumatized her right leg in late August 2016 and suffered a skin tear. This has progressively worsened. She developed cellulitis, which resolved with clindamycin. Ambulating per her baseline. No claudication or rest pain. X-ray 03/20/2015 showed no evidence for fracture or osteomyelitis. Performing dressing changes with silver alginate and using a Tubigrip for edema control. She returns to clinic for follow-up and reports persistent pain with  pressure and with dressing changes. Requested lidocaine cream. No fever or chills. Minimal drainage. Electronic Signature(s) Signed: 04/03/2015 4:56:52 PM By: Loletha Grayer MD Entered By: Loletha Grayer on 04/03/2015 15:05:58 [name], [name] ([id number]) -------------------------------------------------------------------------------- Physical Exam Details Patient Name: [name], [name] Date of Service: 04/03/2015 2:30 PM Medical Record Patient Account Number: [id number] Number: Treating RN: Baruch Gouty, RN, BSN, Rita [birth year] [phone] y.o. Other Clinician: Date of Birth/Sex: Female) Treating BURNS III, Primary Care Physician: Townsend Roger Physician/Extender: Thayer Jew Referring Physician: Townsend Roger Weeks in Treatment: 1 Constitutional . Pulse regular. Respirations normal and unlabored. Afebrile. Marland Kitchen Respiratory WNL. No retractions.. Cardiovascular Pedal Pulses WNL. Integumentary (Hair, Skin) .Marland Kitchen Neurological Sensation normal to touch, pin,and vibration. Psychiatric Judgement and insight Intact.. Oriented times 3.. No evidence of depression, anxiety, or agitation.. Notes Right pretibial ulceration. Full thickness. No exposed deep structures. No probe to bone. Cellulitis resolved. 1+ pitting edema. Palpable DP. ABI not obtained secondary to painful ulceration. Electronic Signature(s) Signed: 04/03/2015 4:56:52 PM By: Loletha Grayer MD Entered By: Loletha Grayer on 04/03/2015 15:06:58 [name], [name] ([id number]) -------------------------------------------------------------------------------- Physician Orders Details Patient Name: [name], [name] Date of Service: 04/03/2015 2:30 PM Medical Record Patient Account Number: [id number] Number: Treating RN: Baruch Gouty, RN, BSN, Rita 1960/08/16 [phone] y.o. Other Clinician: Date of Birth/Sex: Female) Treating BURNS III, Primary Care Physician: Townsend Roger Physician/Extender: Thayer Jew Referring Physician: Dianah Field in Treatment: 1 Verbal / Phone Orders: Yes Clinician: Afful, RN, BSN, Rita Read Back and Verified: Yes Diagnosis Coding Wound Cleansing Wound #1 Right,Anterior Lower Leg o Clean wound with Normal Saline. o May Shower, gently pat wound dry prior to applying new dressing. Anesthetic Wound #1 Right,Anterior Lower Leg o Topical Lidocaine 4% cream applied to wound bed prior to debridement Primary Wound Dressing Wound #1 Right,Anterior Lower Leg o Aquacel Ag Secondary Dressing Wound #1 Right,Anterior Lower Leg o Gauze and Kerlix/Conform Dressing Change Frequency Wound #1 Right,Anterior Lower Leg o  Change dressing every other day. Follow-up Appointments Wound #1 Right,Anterior Lower Leg o Return Appointment in 1 week. Edema Control Wound #1 Right,Anterior Lower Leg o Tubigrip Home Health Wound #1 [place] for [place] Nurse may visit PRN to address patientos wound care needs. [name], [name] ([id number]) o FACE TO FACE ENCOUNTER: MEDICARE and MEDICAID PATIENTS: I certify that this patient is under my care and that I had a face-to-face encounter that meets the physician face-to-face encounter requirements with this patient on this date. The encounter with the patient was in whole or in part for the following MEDICAL CONDITION: (primary reason for [place]) MEDICAL NECESSITY: I certify, that based on my findings, NURSING services are a medically necessary home health service. HOME BOUND STATUS: I certify that my clinical findings support that this patient is homebound (i.e., Due to illness or injury, pt requires aid of supportive devices such as crutches, cane, wheelchairs, walkers, the use of special transportation or the assistance of another person to leave their place of residence. There is a normal inability to leave the home and doing so requires considerable and taxing effort.  Other absences are for medical reasons / religious services and are infrequent or of short duration when for other reasons). o If current dressing causes regression in wound condition, may D/C ordered dressing product/s and apply Normal Saline Moist Dressing daily until next [place] / Other MD appointment. [place] of regression in wound condition at [phone]. o Please direct any NON-WOUND related issues/requests for orders to patient's Primary Care Physician Electronic Signature(s) Signed: 04/03/2015 2:59:59 PM By: Regan Lemming BSN, RN Signed: 04/03/2015 4:56:52 PM By: Loletha Grayer MD Previous Signature: 04/03/2015 2:58:18 PM Version By: Regan Lemming BSN, RN Entered By: Regan Lemming on 04/03/2015 14:59:59 Ul[place]richt, Wanette DMarland Kitchen ([id number]) -------------------------------------------------------------------------------- Problem List Details Patient Name: [name], TINDALL. Date of Service: 04/03/2015 2:30 PM Medical Record Patient Account Number: [id number] Number: Treating RN: Baruch Gouty, RN, BSN, Rita August 03, 1960 [phone] y.o. 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 ([id number]) -------------------------------------------------------------------------------- Progress Note Details Patient Name: [name], [name] Date of Service: 04/03/2015 2:30 PM Medical Record Patient Account Number: [id number] Number: Treating RN: Baruch Gouty, RN, BSN, Rita 11-29-1960 [phone] y.o. Other Clinician: Date of Birth/Sex: Female) Treating BURNS III, Primary Care Physician: Townsend Roger Physician/Extender: Thayer Jew Referring Physician: Townsend Roger Weeks in Treatment: 1 Subjective Chief Complaint Information obtained from Patient Right calf traumatic ulceration. History of Present Illness (HPI) Pleasant 54 year old with multiple medical problems including chronic venous insufficiency, adrenal insufficiency (on prednisone 10 mg daily), COPD, and congestive heart failure. No history of diabetes or peripheral arterial disease. She traumatized her right leg in late August 2016 and suffered a skin tear. This has progressively worsened. She developed  cellulitis, which resolved with clindamycin. Ambulating per her baseline. No claudication or rest pain. X-ray 03/20/2015 showed no evidence for fracture or osteomyelitis. Performing dressing changes with silver alginate and using a Tubigrip for edema control. She returns to clinic for follow-up and reports persistent pain with pressure and with dressing changes. Requested lidocaine cream. No fever or chills. Minimal drainage. Objective Constitutional Pulse regular. Respirations normal and unlabored. Afebrile. Vitals Time Taken: 2:46 PM, Height: 64 in, Weight: 172 lbs, BMI: 29.5, Temperature: 98.8 F, Pulse: 102 bpm, Respiratory Rate: 18 breaths/min, Blood Pressure: 131/69 mmHg. Respiratory WNL. No retractions.. Cardiovascular Pedal Pulses WNL. [name], [name] ([id number]) Neurological Sensation normal to touch, pin,and vibration. Psychiatric Judgement and  insight Intact.. Oriented times 3.. No evidence of depression, anxiety, or agitation.. General Notes: Right pretibial ulceration. Full thickness. No exposed deep structures. No probe to bone. Cellulitis resolved. 1+ pitting edema. Palpable DP. ABI not obtained secondary to painful ulceration. Integumentary (Hair, Skin) Wound #1 status is Open. Original cause of wound was Trauma. The wound is located on the Right,Anterior Lower Leg. The wound measures 2.1cm length x 2.5cm width x 0.2cm depth; 4.123cm^2 area and 0.825cm^3 volume. The wound is limited to skin breakdown. There is no tunneling or undermining noted. There is a medium amount of serosanguineous drainage noted. The wound margin is flat and intact. There is medium (34-66%) granulation within the wound bed. There is a medium (34-66%) amount of necrotic tissue within the wound bed including Eschar and Adherent Slough. The periwound skin appearance exhibited: Localized Edema, Moist, Erythema. The periwound skin appearance did not exhibit: Callus, Crepitus, Excoriation, Fluctuance, Friable, Induration, Rash, Scarring, Dry/Scaly, Maceration, Atrophie Blanche, Cyanosis, Ecchymosis, Hemosiderin Staining, Mottled, Pallor, Rubor. The surrounding wound skin color is noted with erythema which is circumferential. Periwound temperature was noted as No Abnormality. The periwound has tenderness on palpation. 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 Plan [name], [name] ([id number]) Wound Cleansing: Wound #1 Right,Anterior Lower Leg: Clean wound  with Normal Saline. May Shower, gently pat wound dry prior to applying new dressing. Anesthetic: Wound #1 Right,Anterior Lower Leg: Topical Lidocaine 4% cream applied to wound bed prior to debridement Primary Wound Dressing: Wound #1 Right,Anterior Lower Leg: Aquacel Ag Secondary Dressing: Wound #1 Right,Anterior Lower Leg: Gauze and Kerlix/Conform Dressing Change Frequency: Wound #1 Right,Anterior Lower Leg: Change dressing every other day. Follow-up Appointments: Wound #1 Right,Anterior Lower Leg: Return Appointment in 1 week. Edema Control: Wound #1 Right,Anterior Lower Leg: Tubigrip Home Health: Wound #1 Right,Anterior Lower Leg: [place] for [place] Nurse may visit PRN to address patient s wound care needs. FACE TO FACE ENCOUNTER: MEDICARE and MEDICAID PATIENTS: I certify that this patient is under my care and that I had a face-to-face encounter that meets the physician face-to-face encounter requirements with this patient on this date. The encounter with the patient was in whole or in part for the following MEDICAL CONDITION: (primary reason for [place]) MEDICAL NECESSITY: I certify, that based on my findings, NURSING services are a medically necessary home health service. HOME BOUND STATUS: I certify that my clinical findings support that this patient is homebound (i.e., Due to illness  or injury, pt requires aid of supportive devices such as crutches, cane, wheelchairs, walkers, the use of special transportation or the assistance of another person to leave their place of residence. There is a normal inability to leave the home and doing so requires considerable and taxing effort. Other absences are for medical reasons / religious services and are infrequent or of short duration when for other reasons). If current dressing causes regression in wound condition, may D/C ordered dressing product/s and apply Normal Saline Moist Dressing  daily until next [place] / Other MD appointment. [place] of regression in wound condition at [phone]. Please direct any NON-WOUND related issues/requests for orders to patient's Primary Care Physician Continue with silver alginate dressing changes daily to every other day. Tubigrip for edema control. Given prescription for lidocaine 4% topical to use when necessary pain and with dressing changes. CAYDEE, [place]ALK[place] ([id number]) Electronic Signature(s) Signed: 04/03/2015 4:56:52 PM By: Loletha Grayer MD Entered By: Loletha Grayer on 04/03/2015 15:07:40 [place], [name] ([id number]) -------------------------------------------------------------------------------- SuperBill Details Patient Name: SHALYNN, JORSTAD D. Date of Service: 04/03/2015 Medical Record Patient Account Number: [id number] Number: Treating RN: Baruch Gouty, RN, BSN, Rita Jan 19, 1961 [phone] y.o. 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 I87.2 Venous insufficiency (chronic) (peripheral) S81.801D Unspecified open wound, right lower leg, subsequent encounter E27.8 Other specified disorders of adrenal gland Z79.52 Long term (current) use of systemic steroids J44.9 Chronic obstructive pulmonary disease, unspecified Facility Procedures CPT4 Code: [id number] Description: 16606 - WOUND CARE VISIT-LEV 2 EST PT Modifier: Quantity: 1 Physician Procedures CPT4: Description Modifier Quantity Code [id number] 99213 - WC PHYS LEVEL 3 - EST PT 1 ICD-10 Description Diagnosis 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

## 2015-04-10 ENCOUNTER — Ambulatory Visit: Payer: Medicaid Other | Admitting: Surgery

## 2015-04-17 ENCOUNTER — Ambulatory Visit: Payer: Medicaid Other | Admitting: Surgery

## 2015-04-24 ENCOUNTER — Ambulatory Visit: Payer: Medicaid Other | Admitting: Surgery

## 2015-05-15 ENCOUNTER — Other Ambulatory Visit: Payer: Self-pay | Admitting: Internal Medicine

## 2015-05-15 DIAGNOSIS — Z1231 Encounter for screening mammogram for malignant neoplasm of breast: Secondary | ICD-10-CM

## 2015-05-30 ENCOUNTER — Ambulatory Visit: Payer: Medicaid Other | Attending: Internal Medicine

## 2015-06-17 ENCOUNTER — Observation Stay
Admission: EM | Admit: 2015-06-17 | Discharge: 2015-06-17 | Disposition: A | Discharge status: Home / Self Care | Payer: Medicaid Other | Attending: Internal Medicine | Admitting: Internal Medicine

## 2015-06-17 ENCOUNTER — Emergency Department: Payer: Medicaid Other

## 2015-06-17 DIAGNOSIS — E2749 Other adrenocortical insufficiency: Secondary | ICD-10-CM | POA: Diagnosis not present

## 2015-06-17 DIAGNOSIS — Z981 Arthrodesis status: Secondary | ICD-10-CM | POA: Insufficient documentation

## 2015-06-17 DIAGNOSIS — Z881 Allergy status to other antibiotic agents status: Secondary | ICD-10-CM | POA: Diagnosis not present

## 2015-06-17 DIAGNOSIS — F0390 Unspecified dementia without behavioral disturbance: Secondary | ICD-10-CM | POA: Diagnosis not present

## 2015-06-17 DIAGNOSIS — Z823 Family history of stroke: Secondary | ICD-10-CM | POA: Insufficient documentation

## 2015-06-17 DIAGNOSIS — R0602 Shortness of breath: Secondary | ICD-10-CM | POA: Diagnosis not present

## 2015-06-17 DIAGNOSIS — Z882 Allergy status to sulfonamides status: Secondary | ICD-10-CM | POA: Diagnosis not present

## 2015-06-17 DIAGNOSIS — Z888 Allergy status to other drugs, medicaments and biological substances status: Secondary | ICD-10-CM | POA: Insufficient documentation

## 2015-06-17 DIAGNOSIS — M1990 Unspecified osteoarthritis, unspecified site: Secondary | ICD-10-CM | POA: Insufficient documentation

## 2015-06-17 DIAGNOSIS — I2510 Atherosclerotic heart disease of native coronary artery without angina pectoris: Secondary | ICD-10-CM | POA: Diagnosis not present

## 2015-06-17 DIAGNOSIS — E785 Hyperlipidemia, unspecified: Secondary | ICD-10-CM | POA: Insufficient documentation

## 2015-06-17 DIAGNOSIS — F17200 Nicotine dependence, unspecified, uncomplicated: Secondary | ICD-10-CM | POA: Insufficient documentation

## 2015-06-17 DIAGNOSIS — S2241XA Multiple fractures of ribs, right side, initial encounter for closed fracture: Secondary | ICD-10-CM | POA: Diagnosis not present

## 2015-06-17 DIAGNOSIS — Z79899 Other long term (current) drug therapy: Secondary | ICD-10-CM | POA: Insufficient documentation

## 2015-06-17 DIAGNOSIS — Z833 Family history of diabetes mellitus: Secondary | ICD-10-CM | POA: Diagnosis not present

## 2015-06-17 DIAGNOSIS — R0600 Dyspnea, unspecified: Secondary | ICD-10-CM | POA: Diagnosis not present

## 2015-06-17 DIAGNOSIS — Z808 Family history of malignant neoplasm of other organs or systems: Secondary | ICD-10-CM | POA: Diagnosis not present

## 2015-06-17 DIAGNOSIS — R7989 Other specified abnormal findings of blood chemistry: Secondary | ICD-10-CM | POA: Diagnosis present

## 2015-06-17 DIAGNOSIS — R079 Chest pain, unspecified: Principal | ICD-10-CM | POA: Diagnosis present

## 2015-06-17 DIAGNOSIS — Z880 Allergy status to penicillin: Secondary | ICD-10-CM | POA: Diagnosis not present

## 2015-06-17 DIAGNOSIS — S2249XA Multiple fractures of ribs, unspecified side, initial encounter for closed fracture: Secondary | ICD-10-CM | POA: Diagnosis present

## 2015-06-17 DIAGNOSIS — K219 Gastro-esophageal reflux disease without esophagitis: Secondary | ICD-10-CM | POA: Diagnosis not present

## 2015-06-17 DIAGNOSIS — Z820 Family history of epilepsy and other diseases of the nervous system: Secondary | ICD-10-CM | POA: Diagnosis not present

## 2015-06-17 DIAGNOSIS — Z90710 Acquired absence of both cervix and uterus: Secondary | ICD-10-CM | POA: Diagnosis not present

## 2015-06-17 DIAGNOSIS — R748 Abnormal levels of other serum enzymes: Secondary | ICD-10-CM | POA: Insufficient documentation

## 2015-06-17 DIAGNOSIS — Z885 Allergy status to narcotic agent status: Secondary | ICD-10-CM | POA: Insufficient documentation

## 2015-06-17 DIAGNOSIS — J449 Chronic obstructive pulmonary disease, unspecified: Secondary | ICD-10-CM | POA: Diagnosis not present

## 2015-06-17 DIAGNOSIS — Z803 Family history of malignant neoplasm of breast: Secondary | ICD-10-CM | POA: Insufficient documentation

## 2015-06-17 DIAGNOSIS — Z8249 Family history of ischemic heart disease and other diseases of the circulatory system: Secondary | ICD-10-CM | POA: Insufficient documentation

## 2015-06-17 DIAGNOSIS — I5032 Chronic diastolic (congestive) heart failure: Secondary | ICD-10-CM | POA: Insufficient documentation

## 2015-06-17 DIAGNOSIS — Z7951 Long term (current) use of inhaled steroids: Secondary | ICD-10-CM | POA: Diagnosis not present

## 2015-06-17 DIAGNOSIS — R778 Other specified abnormalities of plasma proteins: Secondary | ICD-10-CM

## 2015-06-17 DIAGNOSIS — J9811 Atelectasis: Secondary | ICD-10-CM | POA: Diagnosis not present

## 2015-06-17 DIAGNOSIS — E2740 Unspecified adrenocortical insufficiency: Secondary | ICD-10-CM | POA: Diagnosis present

## 2015-06-17 DIAGNOSIS — Z811 Family history of alcohol abuse and dependence: Secondary | ICD-10-CM | POA: Diagnosis not present

## 2015-06-17 DIAGNOSIS — S2239XA Fracture of one rib, unspecified side, initial encounter for closed fracture: Secondary | ICD-10-CM | POA: Diagnosis present

## 2015-06-17 DIAGNOSIS — G8929 Other chronic pain: Secondary | ICD-10-CM | POA: Diagnosis not present

## 2015-06-17 DIAGNOSIS — F419 Anxiety disorder, unspecified: Secondary | ICD-10-CM | POA: Diagnosis present

## 2015-06-17 DIAGNOSIS — Z8379 Family history of other diseases of the digestive system: Secondary | ICD-10-CM | POA: Insufficient documentation

## 2015-06-17 DIAGNOSIS — R55 Syncope and collapse: Secondary | ICD-10-CM | POA: Insufficient documentation

## 2015-06-17 DIAGNOSIS — E559 Vitamin D deficiency, unspecified: Secondary | ICD-10-CM | POA: Insufficient documentation

## 2015-06-17 HISTORY — DX: Anxiety disorder, unspecified: F41.9

## 2015-06-17 HISTORY — DX: Hyperlipidemia, unspecified: E78.5

## 2015-06-17 HISTORY — DX: Other chronic pain: G89.29

## 2015-06-17 HISTORY — DX: Unspecified dementia, unspecified severity, without behavioral disturbance, psychotic disturbance, mood disturbance, and anxiety: F03.90

## 2015-06-17 HISTORY — DX: Gastro-esophageal reflux disease without esophagitis: K21.9

## 2015-06-17 HISTORY — DX: Atherosclerotic heart disease of native coronary artery without angina pectoris: I25.10

## 2015-06-17 HISTORY — DX: Chronic diastolic (congestive) heart failure: I50.32

## 2015-06-17 HISTORY — DX: Chronic obstructive pulmonary disease, unspecified: J44.9

## 2015-06-17 LAB — CBC WITH DIFFERENTIAL/PLATELET
Basophils Absolute: 0.1 10*3/uL (ref 0–0.1)
Basophils Relative: 1 %
Eosinophils Absolute: 0.2 10*3/uL (ref 0–0.7)
HCT: 42.6 % (ref 35.0–47.0)
Hemoglobin: 13.8 g/dL (ref 12.0–16.0)
LYMPHS ABS: 4.4 10*3/uL — AB (ref 1.0–3.6)
MCH: 27.4 pg (ref 26.0–34.0)
MCHC: 32.4 g/dL (ref 32.0–36.0)
MCV: 84.4 fL (ref 80.0–100.0)
MONO ABS: 1 10*3/uL — AB (ref 0.2–0.9)
Monocytes Relative: 8 %
Neutro Abs: 7.5 10*3/uL — ABNORMAL HIGH (ref 1.4–6.5)
Neutrophils Relative %: 56 %
PLATELETS: 320 10*3/uL (ref 150–440)
RBC: 5.05 MIL/uL (ref 3.80–5.20)
RDW: 17.4 % — AB (ref 11.5–14.5)
WBC: 13.2 10*3/uL — ABNORMAL HIGH (ref 3.6–11.0)

## 2015-06-17 LAB — COMPREHENSIVE METABOLIC PANEL
ALT: 23 U/L (ref 14–54)
AST: 31 U/L (ref 15–41)
Albumin: 3.9 g/dL (ref 3.5–5.0)
Alkaline Phosphatase: 108 U/L (ref 38–126)
Anion gap: 12 (ref 5–15)
BUN: 16 mg/dL (ref 6–20)
CALCIUM: 10.6 mg/dL — AB (ref 8.9–10.3)
CO2: 29 mmol/L (ref 22–32)
Chloride: 97 mmol/L — ABNORMAL LOW (ref 101–111)
Creatinine, Ser: 1.01 mg/dL — ABNORMAL HIGH (ref 0.44–1.00)
GLUCOSE: 110 mg/dL — AB (ref 65–99)
Potassium: 3.9 mmol/L (ref 3.5–5.1)
Sodium: 138 mmol/L (ref 135–145)
TOTAL PROTEIN: 7 g/dL (ref 6.5–8.1)
Total Bilirubin: 1 mg/dL (ref 0.3–1.2)

## 2015-06-17 LAB — TROPONIN I
TROPONIN I: 0.04 ng/mL — AB (ref <0.031)
Troponin I: 0.04 ng/mL — ABNORMAL HIGH (ref <0.031)

## 2015-06-17 LAB — LIPASE, BLOOD: Lipase: 20 U/L (ref 11–51)

## 2015-06-17 MED ORDER — ALPRAZOLAM 1 MG PO TABS
1.0000 mg | ORAL_TABLET | Freq: Four times a day (QID) | ORAL | Status: DC
  Administered 2015-06-17 (×2): 1 mg via ORAL
  Filled 2015-06-17 (×2): qty 1

## 2015-06-17 MED ORDER — OXYCODONE-ACETAMINOPHEN 5-325 MG PO TABS
1.0000 | ORAL_TABLET | Freq: Four times a day (QID) | ORAL | Status: DC | PRN
  Administered 2015-06-17: 1 via ORAL
  Filled 2015-06-17: qty 1

## 2015-06-17 MED ORDER — GABAPENTIN 600 MG PO TABS
600.0000 mg | ORAL_TABLET | Freq: Three times a day (TID) | ORAL | Status: DC
Start: 2015-06-17 — End: 2015-06-17
  Administered 2015-06-17 (×2): 600 mg via ORAL
  Filled 2015-06-17 (×2): qty 1

## 2015-06-17 MED ORDER — OXYCODONE HCL 5 MG PO TABS: 5.0000 mg | ORAL_TABLET | Freq: Four times a day (QID) | ORAL | Status: DC | PRN

## 2015-06-17 MED ORDER — MOMETASONE FURO-FORMOTEROL FUM 100-5 MCG/ACT IN AERO
2.0000 | INHALATION_SPRAY | Freq: Two times a day (BID) | RESPIRATORY_TRACT | Status: DC
  Filled 2015-06-17: qty 8.8

## 2015-06-17 MED ORDER — ACETAMINOPHEN 650 MG RE SUPP: 650.0000 mg | Freq: Four times a day (QID) | RECTAL | Status: DC | PRN

## 2015-06-17 MED ORDER — PANTOPRAZOLE SODIUM 40 MG PO TBEC
40.0000 mg | DELAYED_RELEASE_TABLET | Freq: Two times a day (BID) | ORAL | Status: DC
  Administered 2015-06-17 (×2): 40 mg via ORAL
  Filled 2015-06-17 (×2): qty 1

## 2015-06-17 MED ORDER — HYDROMORPHONE HCL 1 MG/ML IJ SOLN
1.0000 mg | INTRAMUSCULAR | Status: DC | PRN
  Administered 2015-06-17 (×2): 1 mg via INTRAVENOUS
  Filled 2015-06-17 (×2): qty 1

## 2015-06-17 MED ORDER — ONDANSETRON HCL 4 MG/2ML IJ SOLN
4.0000 mg | Freq: Four times a day (QID) | INTRAMUSCULAR | Status: DC | PRN
  Administered 2015-06-17 (×2): 4 mg via INTRAVENOUS
  Filled 2015-06-17 (×2): qty 2

## 2015-06-17 MED ORDER — ONDANSETRON HCL 4 MG PO TABS: 4.0000 mg | ORAL_TABLET | Freq: Four times a day (QID) | ORAL | Status: DC | PRN

## 2015-06-17 MED ORDER — ALBUTEROL SULFATE (2.5 MG/3ML) 0.083% IN NEBU: 3.0000 mL | INHALATION_SOLUTION | Freq: Four times a day (QID) | RESPIRATORY_TRACT | Status: DC | PRN

## 2015-06-17 MED ORDER — PREDNISONE 20 MG PO TABS
60.0000 mg | ORAL_TABLET | Freq: Once | ORAL | Status: AC
  Administered 2015-06-17: 60 mg via ORAL
  Filled 2015-06-17: qty 3

## 2015-06-17 MED ORDER — ENOXAPARIN SODIUM 40 MG/0.4ML ~~LOC~~ SOLN
40.0000 mg | SUBCUTANEOUS | Status: DC
  Administered 2015-06-17: 40 mg via SUBCUTANEOUS
  Filled 2015-06-17: qty 0.4

## 2015-06-17 MED ORDER — HYDROMORPHONE HCL 1 MG/ML IJ SOLN
1.0000 mg | Freq: Once | INTRAMUSCULAR | Status: AC
  Administered 2015-06-17: 1 mg via INTRAVENOUS
  Filled 2015-06-17: qty 1

## 2015-06-17 MED ORDER — ONDANSETRON HCL 4 MG/2ML IJ SOLN
4.0000 mg | Freq: Once | INTRAMUSCULAR | Status: AC
  Administered 2015-06-17: 4 mg via INTRAVENOUS
  Filled 2015-06-17: qty 2

## 2015-06-17 MED ORDER — ACETAMINOPHEN 325 MG PO TABS
650.0000 mg | ORAL_TABLET | Freq: Four times a day (QID) | ORAL | Status: DC | PRN
Start: 2015-06-17 — End: 2015-06-17

## 2015-06-17 MED ORDER — SODIUM CHLORIDE 0.9 % IJ SOLN
3.0000 mL | Freq: Two times a day (BID) | INTRAMUSCULAR | Status: DC
  Administered 2015-06-17: 3 mL via INTRAVENOUS

## 2015-06-17 MED ORDER — OXYCODONE-ACETAMINOPHEN 10-325 MG PO TABS: 1.0000 | ORAL_TABLET | Freq: Four times a day (QID) | ORAL | Status: DC | PRN

## 2015-06-17 MED ORDER — SODIUM CHLORIDE 0.9 % IV BOLUS (SEPSIS)
1000.0000 mL | Freq: Once | INTRAVENOUS | Status: AC
  Administered 2015-06-17: 1000 mL via INTRAVENOUS

## 2015-06-17 MED ORDER — FUROSEMIDE 40 MG PO TABS
80.0000 mg | ORAL_TABLET | Freq: Every day | ORAL | Status: DC
  Administered 2015-06-17: 80 mg via ORAL
  Filled 2015-06-17: qty 2

## 2015-06-17 MED ORDER — ALPRAZOLAM 0.5 MG PO TABS
1.0000 mg | ORAL_TABLET | Freq: Once | ORAL | Status: AC
  Administered 2015-06-17: 1 mg via ORAL
  Filled 2015-06-17: qty 2

## 2015-06-17 MED ORDER — IOHEXOL 350 MG/ML SOLN
75.0000 mL | Freq: Once | INTRAVENOUS | Status: AC | PRN
  Administered 2015-06-17: 75 mL via INTRAVENOUS

## 2015-06-17 MED ORDER — ASPIRIN 81 MG PO CHEW
324.0000 mg | CHEWABLE_TABLET | Freq: Once | ORAL | Status: AC
  Administered 2015-06-17: 324 mg via ORAL
  Filled 2015-06-17: qty 4

## 2015-06-17 MED ORDER — DIAZEPAM 2 MG PO TABS
1.0000 mg | ORAL_TABLET | Freq: Once | ORAL | Status: AC
  Administered 2015-06-17: 1 mg via ORAL
  Filled 2015-06-17: qty 1

## 2015-06-17 NOTE — Discharge Instructions (Signed)
Chest Wall Pain °Chest wall pain is pain in or around the bones and muscles of your chest. Sometimes, an injury causes this pain. Sometimes, the cause may not be known. This pain may take several weeks or longer to get better. °HOME CARE °Pay attention to any changes in your symptoms. Take these actions to help with your pain: °· Rest as told by your doctor. °· Avoid activities that cause pain. Try not to use your chest, belly (abdominal), or side muscles to lift heavy things. °· If directed, apply ice to the painful area: °¨ Put ice in a plastic bag. °¨ Place a towel between your skin and the bag. °¨ Leave the ice on for 20 minutes, 2-3 times per day. °· Take over-the-counter and prescription medicines only as told by your doctor. °· Do not use tobacco products, including cigarettes, chewing tobacco, and e-cigarettes. If you need help quitting, ask your doctor. °· Keep all follow-up visits as told by your doctor. This is important. °GET HELP IF: °· You have a fever. °· Your chest pain gets worse. °· You have new symptoms. °GET HELP RIGHT AWAY IF: °· You feel sick to your stomach (nauseous) or you throw up (vomit). °· You feel sweaty or light-headed. °· You have a cough with phlegm (sputum) or you cough up blood. °· You are short of breath. °  °This information is not intended to replace advice given to you by your health care provider. Make sure you discuss any questions you have with your health care provider. °  °Document Released: 12/23/2007 Document Revised: 03/27/2015 Document Reviewed: 10/01/2014 °Elsevier Interactive Patient Education ©2016 [employer] ° °

## 2015-06-17 NOTE — Progress Notes (Signed)
Pt stated she has called her brother/sister-in-law for transportation.

## 2015-06-17 NOTE — ED Provider Notes (Signed)
[facility] Emergency Department Provider Note  ____________________________________________  Time seen: Approximately 1:55 AM  I have reviewed the triage vital signs and the nursing notes.   HISTORY  Chief Complaint Anxiety    HPI [name] is a 54 y.o. female who presents to the ED from home via EMS with a chief complaint of right-sided chest pain. Patient was admitted at [facility] November 1-3 for falls incurred while sleepwalking. Reports right sided rib fractures seen on CT scan without pneumothorax. She has been experiencing right-sided chest pain since that time. Tonight, however, patient states when she laid down to go to sleep that "something changed". She is complaining of more severe right chest pain, anxiety, shortness of breath and is concerned her lung has collapsed. She also tells me she forgot to take her nightly anxiety medicine (Xanax) and is feeling particularly anxious. Denies more recent fall, trauma or injury. Denies recent travel. Denies recent fever, chills, abdominal pain, nausea, vomiting, diarrhea. Nothing makes her pain better. Movement and deep breathing makes her pain worse.   Past Medical History  Diagnosis Date  . Adrenal insufficiency ([place])   . CHF (congestive heart failure) ([place])   . Allergic rhinitis due to allergen  . Anemia  . Anesthesia complication  Doesn't respond to versed  . Anxiety  . Arthritis  . Back pain  . Bronchitis, chronic  . CHF (congestive heart failure)  diastollic. EF 60% by echo in 2014  . Chronic pain  . Chronic sinusitis  . Chronic sinusitis  s/p surgery  . COPD (chronic obstructive pulmonary disease)  . Cushing's disease  . Dementia  . Encephalitis  . Endometriosis  followed at [facility]  . GERD (gastroesophageal reflux disease)  . H/O discectomy  cervical and thoracic; s/p cervical plate  . Heart disease  . Hemorrhoids  . Hives  . Hyperlipidemia  . Neuropathy  . Obstructive  chronic bronchitis with exacerbation  . Pneumonia  . PONV (postoperative nausea and vomiting)  Premedication with phenergan  . S/P discectomy  cervical and thoracic; cervical plate  . Sinusitis  . Sleep apnea  . Sleep disturbance, unspecified  . Vitamin B 12 deficiency  . Vitamin D deficiency    There are no active problems to display for this patient.   Past surgical history . Sinus surgery  . Nasal septum surgery  . Acdf 1984, 2003  . Hysterectomy [id number]  Complicated by wound dehiscence  . Appendectomy  . Multiple hysteroscopies and laproscopies  Endometriosis  . Esophagogastrodoudenoscopy w/biopsy N/A 11/29/2013  Procedure: EGD at [facility] with [place]; Surgeon: Reola Mosher, MD; Location: [place]; Service: Gastroenterology; Laterality: N/A;  . Hysterectomy vaginal  . Umbilical hernia repair  . Hernia repair    Current Outpatient Rx  Name  Route  Sig  Dispense  Refill  . clindamycin (CLEOCIN) 300 MG capsule   Oral   Take 1 capsule (300 mg total) by mouth 4 (four) times daily.   40 capsule   0   . HYDROmorphone (DILAUDID) 2 MG tablet   Oral   Take 0.5 tablets (1 mg total) by mouth every 4 (four) hours as needed for severe pain. DO NOT TAKE WITH OXYCODONE!   8 tablet   0     Allergies Levaquin; Codeine; Cymbalta; Eggs or egg-derived products; Guaifenesin & derivatives; Keflex; Klonopin; Macrobid; Penicillins; and Sulfa antibiotics   Social History Social History  Substance Use Topics  . Smoking status: Current Every Day Smoker  .  Smokeless tobacco: None  . Alcohol Use: No    Review of Systems Constitutional: No fever/chills Eyes: No visual changes. ENT: No sore throat. Cardiovascular: Positive for right chest pain. Respiratory: Positive for shortness of breath. Gastrointestinal: No abdominal pain.  No nausea, no vomiting.  No diarrhea.  No constipation. Genitourinary: Negative for dysuria. Musculoskeletal: Negative for back  pain. Skin: Negative for rash. Neurological: Negative for headaches, focal weakness or numbness. Psychiatric:Positive for anxiety.  10-point ROS otherwise negative.  ____________________________________________   PHYSICAL EXAM:  VITAL SIGNS: ED Triage Vitals  Enc Vitals Group     BP 06/17/15 0130 124/86 mmHg     Pulse Rate 06/17/15 0130 98     Resp 06/17/15 0130 15     Temp 06/17/15 0130 98.9 F (37.2 C)     Temp src --      SpO2 06/17/15 0130 96 %     Weight 06/17/15 0123 168 lb (76.204 kg)     Height 06/17/15 0123 5\' 3"  (1.6 m)     Head Cir --      Peak Flow --      Pain Score 06/17/15 0123 10     Pain Loc --      Pain Edu? --      Excl. in [place]? --     Constitutional: Alert and oriented. Anxious and in mild acute distress. Eyes: Conjunctivae are normal. PERRL. EOMI. Head: Atraumatic. Nose: No congestion/rhinnorhea. Mouth/Throat: Mucous membranes are moist.  Oropharynx non-erythematous. Neck: No stridor.  No cervical spine tenderness to palpation. Cardiovascular: Normal rate, regular rhythm. Grossly normal heart sounds.  Good peripheral circulation. Respiratory: Normal respiratory effort.  No retractions. Splinting. Lungs CTAB. Right lower ribs tender to palpation. Gastrointestinal: Soft and nontender. No distention. No abdominal bruits. No CVA tenderness. Musculoskeletal: No lower extremity tenderness nor edema.  No joint effusions. Neurologic:  Normal speech and language. No gross focal neurologic deficits are appreciated. No gait instability. Skin:  Skin is warm, dry and intact. No rash noted. Psychiatric: Mood and affect are anxious. Speech and behavior are normal.  ____________________________________________   LABS (all labs ordered are listed, but only abnormal results are displayed)  Labs Reviewed  CBC WITH DIFFERENTIAL/PLATELET - Abnormal; Notable for the following:    WBC 13.2 (*)    RDW 17.4 (*)    Neutro Abs 7.5 (*)    Lymphs Abs 4.4 (*)     Monocytes Absolute 1.0 (*)    All other components within normal limits  COMPREHENSIVE METABOLIC PANEL - Abnormal; Notable for the following:    Chloride 97 (*)    Glucose, Bld 110 (*)    Creatinine, Ser 1.01 (*)    Calcium 10.6 (*)    All other components within normal limits  TROPONIN I - Abnormal; Notable for the following:    Troponin I 0.04 (*)    All other components within normal limits  LIPASE, BLOOD   ____________________________________________  EKG  ED ECG REPORT I, SUNG,JADE J, the attending physician, personally viewed and interpreted this ECG.   Date: 06/17/2015  EKG Time: 0131  Rate: 97  Rhythm: normal EKG, normal sinus rhythm  Axis: Normal  Intervals:none  ST&T Change: Nonspecific  ____________________________________________  RADIOLOGY  Chest 2 view (view by me, interpreted per Dr. Radene Knee) sees: No acute cardiopulmonary process seen.  CT head per Dr. Alroy Dust: Normal brain.  CT angiogram chest interpreted per Dr. Radene Knee: 1. No evidence of pulmonary embolus. 2. Mild bibasilar atelectasis noted. Lungs otherwise clear.  3. Healing fractures of the right eighth through tenth lateral ribs noted. The more inferior ribs are not imaged on this study. ____________________________________________   PROCEDURES  Procedure(s) performed: None  Critical Care performed:   CRITICAL CARE Performed by: Paulette Blanch   Total critical care time: 30 minutes  Critical care time was exclusive of separately billable procedures and treating other patients.  Critical care was necessary to treat or prevent imminent or life-threatening deterioration.  Critical care was time spent personally by me on the following activities: development of treatment plan with patient and/or surrogate as well as nursing, discussions with consultants, evaluation of patient's response to treatment, examination of patient, obtaining history from patient or surrogate, ordering and performing  treatments and interventions, ordering and review of laboratory studies, ordering and review of radiographic studies, pulse oximetry and re-evaluation of patient's condition.  ____________________________________________   INITIAL IMPRESSION / ASSESSMENT AND PLAN / ED COURSE  Pertinent labs & imaging results that were available during my care of the patient were reviewed by me and considered in my medical decision making (see chart for details).  54 year old female with known right-sided rib fractures who presents with increasing pain upon lying down this morning. Room air saturations 96%. Medications ordered per patient's request as she has multiple drug allergies. Will also administer her nightly Xanax dose for anxiety.  ----------------------------------------- 3:59 AM on 06/17/2015 -----------------------------------------  Updated patient of imaging and laboratory results. Aspirin ordered. Discussed with hospitalist to evaluate patient in the emergency department for admission for chest pain with elevated troponin. ____________________________________________   FINAL CLINICAL IMPRESSION(S) / ED DIAGNOSES  Final diagnoses:  Chest pain, unspecified chest pain type  Elevated troponin  Anxiety  Adrenal insufficiency ([place])      Paulette Blanch, MD 06/17/15 [phone]

## 2015-06-17 NOTE — Progress Notes (Signed)
PT Cancellation Note  Patient Details Name: [name] MRN: [id number] DOB: 1960/12/02   Cancelled Treatment:    Reason Eval/Treat Not Completed: Other (comment). Pt with active dc orders at this time. No needs identified at this time per chart review and discussion with RN. Will dc current order. Please re-order if necessary.   Tery Hoeger 06/17/2015, 5:00 PM Greggory Stallion, PT, DPT [phone]

## 2015-06-17 NOTE — Progress Notes (Addendum)
Per Dr. Manuella Ghazi, discharge after 3rd troponin has resulted.  Called lab regarding results - said they are being run now.

## 2015-06-17 NOTE — ED Notes (Signed)
Pharm tech at bedside to verify medication list

## 2015-06-17 NOTE — Care Management (Signed)
Patient admitted over night with chest pain.  During assessment patient would only answer partial questions.  Patient lives at home alone.  Patient states that she is on chronic pain medication and receives them from [place], and [employer].  Patient states that she uses [facility] for transportation.  Patient is currently open with [facility] for OT, PT, and RN.  MD to order resumption of care orders.  [facility] notified of admission information will be faxed to [phone]

## 2015-06-17 NOTE — ED Notes (Signed)
Admitting MD at bedside.

## 2015-06-17 NOTE — ED Notes (Signed)
Elevated troponin 0.04 reported to Dr Beather Arbour

## 2015-06-17 NOTE — ED Notes (Signed)
Pt reporting pain and increased anxiety, I am releasing her floor orders for dilaudid and zofran.

## 2015-06-17 NOTE — Consult Note (Signed)
Endocrine Initial Consult Note Date of Consult: 06/17/2015  Consulting Service: [facility] Endocrinology  MD Requesting Consult: Max Sane  SUBJECTIVE: Reason for Consultation: adrenal insufficiency  History of Present Illness: [name] is a 54 y.o. 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 Cardiac workup was unrevealing. She believes this event was related to her adrenal insufficiency and reports symptoms of worsening nausea, lightheadedness, generalized weakness, and malaise over the weekend. She is followed by Dr. Bonney Aid at [facility] and was most recently seen on 06/11/2015. She has been on a regimen of prednisone 10 mg daily for over a month now. There has been a slow tapering of steroids because of Cushing's syndrome. She reports abdominal obesity and edema has improved. However, she still has thin skin and easy bruising. She recently doubled the steroid dose but felt no better and so she tripled her dose prior to leaving home. She felt better with this change in steroid dose but not significantly better.  Endocrinology has been consulted regarding recommendations for maintenance steroid dosing.  ROS: as in HPI, otherwise remainder of the 10 pt ROS was negative.  Patient Active Problem List   Diagnosis Date Noted  . Chest pain 06/17/2015  . Elevated troponin 06/17/2015  . Fracture, ribs 06/17/2015  . GERD (gastroesophageal reflux disease) 06/17/2015  . COPD (chronic obstructive pulmonary disease) ([place]) 06/17/2015  . Chronic pain 06/17/2015  . Anxiety 06/17/2015  . Adrenal insufficiency ([place]) 06/17/2015     Past Medical History  Diagnosis Date  . Adrenal insufficiency ([place])   . Chronic diastolic CHF (congestive heart failure) ([place])   . GERD (gastroesophageal reflux disease)   . Dementia   . CAD (coronary artery disease)   . COPD (chronic  obstructive pulmonary disease) ([place])   . Chronic pain   . Anxiety   . HLD (hyperlipidemia)    Past Surgical History  Procedure Laterality Date  . Nasal sinus surgery    . Nasal septum surgery    . Vaginal hysterectomy    . Appendectomy    . Umbilical hernia repair     Family History  Problem Relation Age of Onset  . Ovarian cancer Mother   . Breast cancer Mother   . Brain cancer Mother   . CAD Father   . Diabetes Father   . Hepatitis Father   . Liver disease Father   . Cancer Father   . Alcohol abuse Father   . Heart attack Brother   . Sleep apnea Brother   . Diabetes Brother   . ALS Paternal Grandmother   . Stroke Maternal Grandmother     Social History:  Social History  Substance Use Topics  . Smoking status: Current Every Day Smoker  . Smokeless tobacco: Not on file  . Alcohol Use: 0.0 oz/week    0 Standard drinks or equivalent per week     Allergies  Allergen Reactions  . Levaquin [Levofloxacin In D5w] Anaphylaxis  . Amoxicillin Itching  . Codeine Nausea And Vomiting  . Cymbalta [Duloxetine Hcl] Swelling  . Gatifloxacin Hives and Swelling    Other Reaction: Other reaction: Swelling  . Guaifenesin & Derivatives Hives and Swelling  . Keflex [Cephalexin] Other (See Comments)    Unknown  . Klonopin [Clonazepam] Other (See Comments)    "Makes me have bad thoughts"  . Macrobid [employer] Macro] Other (See Comments)    "kidneys shuts  down" Chills, Fever.  . Penicillins Hives  . Sulfa Antibiotics Hives  . Amitriptyline Rash    "It makes me want to fall."     Medications:  Prednisone 10 mg once daily Albuterol Xanax 1 mg qid  Calcium carbonate Flexeril Flonase Advair diskus Lasix  Neurontin Dilaudid prn Atarax Prilosec Percocet  OBJECTIVE: Temp:  [98.4 F (36.9 C)-98.9 F (37.2 C)] 98.4 F (36.9 C) (11/28 0955) Pulse Rate:  [88-106] 106 (11/28 1615) Resp:  [15-22] 22 (11/28 0955) BP: (117-160)/(65-94) 135/65 mmHg (11/28  1615) SpO2:  [94 %-97 %] 96 % (11/28 1518) Weight:  [76.204 kg (168 lb)] 76.204 kg (168 lb) (11/28 0123)  Temp (24hrs), Avg:98.7 F (37.1 C), Min:98.4 F (36.9 C), Max:98.9 F (37.2 C)  Weight: 76.204 kg (168 lb)  Physical Exam: Gen: no acute distress, well-nourished, well-appearing HEENT: [place]/AT, eyes anicteric, EOMI, mucous membranes moist, no oropharyngeal lesions Neck: no thyroid enlargement or nodules noted, no cervical lymphadenopathy CAD: regular rate, regular rhythm. No murmur rubs or gallops PULM: clear to ausculation, no wheezes, rhonchi or rales. GI: soft, non tender, non distended, obese, +striae. EXT: no clubbing, cyanosis or edema   Skin: warm, dry Neuro: grossly non focal, normal DTRs, alert and oriented x 3   BMP Latest Ref Rng 06/17/2015 03/20/2015  Glucose 65 - 99 mg/dL 110(H) 93  BUN 6 - 20 mg/dL 16 13  Creatinine 0.44 - 1.00 mg/dL 1.01(H) 1.09(H)  Sodium 135 - 145 mmol/L 138 136  Potassium 3.5 - 5.1 mmol/L 3.9 3.2(L)  Chloride 101 - 111 mmol/L 97(L) 95(L)  CO2 22 - 32 mmol/L 29 28  Calcium 8.9 - 10.3 mg/dL 10.6(H) 9.3   CBC Latest Ref Rng 06/17/2015 03/20/2015  WBC 3.6 - 11.0 K/uL 13.2(H) 11.9(H)  Hemoglobin 12.0 - 16.0 g/dL 13.8 12.6  Hematocrit 35.0 - 47.0 % 42.6 38.0  Platelets 150 - 440 K/uL 320 331    ASSESSMENT:  54 yo woman with PMH secondary adrenal insufficiency, admitted with chest pain and shortness of breath. ACS ruled out. She has been hemodynamically stable. It is unlikely that the recent episode resulted from insufficient steroid replacement and so there is no indication for ongoing stress dosing of steroids.  RECOMMENDATIONS:   Would resume her home regimen of prednisone 10 mg once daily. Advised she follow sick day rules, which she is familiar with. Advised she schedule a follow up visit outpatient with Dr. Joycelyn Schmid in the next 1-2 weeks to review her steroid regimen. Reviewed the adverse effects related to supraphysiologic steroid  dosing. She verbalized understanding and will contact [facility] tomorrow for f/u arrangement.  Atha Starks, MD [facility] Endocrinology

## 2015-06-17 NOTE — H&P (Signed)
[facility] [place] NAME: [name]    MR#:  [id number]  DATE OF BIRTH:  1961/01/21  DATE OF ADMISSION:  06/17/2015  PRIMARY CARE PHYSICIAN: Townsend Roger, MD   REQUESTING/REFERRING PHYSICIAN: Beather Arbour, M.D.  CHIEF COMPLAINT:   Chief Complaint  Patient presents with  . Anxiety    HISTORY OF PRESENT ILLNESS:  [name]  is a 54 y.o. female who presents with chest pain. Patient has been having falls at home recently, and has suffered multiple fractures including recent right-sided rib fractures, pelvic fracture, and lumbar fracture per her report. Rib fractures healing and seen on CT scan today. However, she states that her chest pain that prompted presentation to the ED today was left-sided chest pain which occurred while she was laying down in bed trying to sleep. She denies this as sharp chest pain that does not radiate, is not associated with shortness of breath, diaphoresis, nausea or vomiting, or abdominal pain. In the ED her workup is largely benign except for the rib fracture seen on CT as noted, and a very mildly elevated troponin 0.04. Hospitals were called for admission for workup for her chest pain.  PAST MEDICAL HISTORY:   Past Medical History  Diagnosis Date  . Adrenal insufficiency ([place])   . Chronic diastolic CHF (congestive heart failure) ([place])   . GERD (gastroesophageal reflux disease)   . Dementia   . CAD (coronary artery disease)   . COPD (chronic obstructive pulmonary disease) ([place])   . Chronic pain   . Anxiety   . HLD (hyperlipidemia)     PAST SURGICAL HISTORY:   Past Surgical History  Procedure Laterality Date  . Nasal sinus surgery    . Nasal septum surgery    . Vaginal hysterectomy    . Appendectomy    . Umbilical hernia repair      SOCIAL HISTORY:   Social History  Substance Use Topics  . Smoking status: Current Every Day Smoker  . Smokeless tobacco: Not on file  . Alcohol Use: 0.0  oz/week    0 Standard drinks or equivalent per week    FAMILY HISTORY:   Family History  Problem Relation Age of Onset  . Ovarian cancer Mother   . Breast cancer Mother   . Brain cancer Mother   . CAD Father   . Diabetes Father   . Hepatitis Father   . Liver disease Father   . Cancer Father   . Alcohol abuse Father   . Heart attack Brother   . Sleep apnea Brother   . Diabetes Brother   . ALS Paternal Grandmother   . Stroke Maternal Grandmother     DRUG ALLERGIES:   Allergies  Allergen Reactions  . Levaquin [Levofloxacin In D5w] Anaphylaxis  . Codeine Nausea And Vomiting  . Cymbalta [Duloxetine Hcl] Swelling  . Guaifenesin & Derivatives Hives and Swelling  . Keflex [Cephalexin] Other (See Comments)    Unknown  . Klonopin [Clonazepam] Other (See Comments)    "Makes me have bad thoughts"  . Macrobid [employer] Macro] Other (See Comments)    "kidneys shuts down" Chills, Fever.  . Penicillins Hives  . Sulfa Antibiotics Hives  . Amitriptyline Rash    "It makes me want to fall."    MEDICATIONS AT HOME:   Prior to Admission medications   Medication Sig Start Date End Date Taking? Authorizing Provider  albuterol (PROAIR HFA) 108 (90 BASE) MCG/ACT inhaler Inhale 2  puffs into the lungs every 6 (six) hours as needed. 06/04/15  Yes Historical Provider, MD  ALPRAZolam Duanne Moron) 1 MG tablet Take 1 tablet by mouth 4 (four) times daily.   Yes Historical Provider, MD  Calcium Carbonate-Vitamin D 600-400 MG-UNIT tablet Take 1 tablet by mouth daily. 05/22/15 05/21/16 Yes Historical Provider, MD  cyclobenzaprine (FLEXERIL) 10 MG tablet Take 1 tablet by mouth 3 (three) times daily as needed.   Yes Historical Provider, MD  fluticasone (FLONASE) 50 MCG/ACT nasal spray Place 2 sprays into the nose daily. 01/02/13  Yes Historical Provider, MD  Fluticasone-Salmeterol (ADVAIR DISKUS) 250-50 MCG/DOSE AEPB Inhale 1 puff into the lungs every 12 (twelve) hours. 06/04/15 06/03/16 Yes  Historical Provider, MD  furosemide (LASIX) 80 MG tablet Take 1 tablet by mouth daily. 07/18/14 07/18/15 Yes Historical Provider, MD  gabapentin (NEURONTIN) 600 MG tablet Take 1 tablet by mouth 3 (three) times daily.   Yes Historical Provider, MD  HYDROmorphone (DILAUDID) 2 MG tablet Take 1 mg by mouth every 4 (four) hours as needed. 03/23/15  Yes Historical Provider, MD  hydrOXYzine (ATARAX/VISTARIL) 25 MG tablet Take 1 tablet by mouth 3 (three) times daily as needed. 01/02/13  Yes Historical Provider, MD  omeprazole (PRILOSEC) 40 MG capsule Take 1 capsule by mouth 2 (two) times daily. 02/19/15 02/19/16 Yes Historical Provider, MD  oxyCODONE-acetaminophen (PERCOCET) 10-325 MG tablet Take 1 tablet by mouth every 6 (six) hours as needed. 10/05/12  Yes Historical Provider, MD  PREDNISONE PO Take by mouth.   Yes Historical Provider, MD  clindamycin (CLEOCIN) 300 MG capsule Take 1 capsule (300 mg total) by mouth 4 (four) times daily. Patient not taking: Reported on 06/17/2015 03/20/15   Pierce Crane Beers, PA-C  HYDROmorphone (DILAUDID) 2 MG tablet Take 0.5 tablets (1 mg total) by mouth every 4 (four) hours as needed for severe pain. DO NOT TAKE WITH OXYCODONE! 03/23/15   Sable Feil, PA-C    REVIEW OF SYSTEMS:  Review of Systems  Constitutional: Negative for fever, chills, weight loss and malaise/fatigue.  HENT: Negative for ear pain, hearing loss and tinnitus.   Eyes: Negative for blurred vision, double vision, pain and redness.  Respiratory: Negative for cough, hemoptysis and shortness of breath.   Cardiovascular: Positive for chest pain. Negative for palpitations, orthopnea and leg swelling.  Gastrointestinal: Negative for nausea, vomiting, abdominal pain, diarrhea and constipation.  Genitourinary: Negative for dysuria, frequency and hematuria.  Musculoskeletal: Positive for back pain and joint pain (pelvis). Negative for neck pain.  Skin:       No acne, rash, or lesions  Neurological: Negative for  dizziness, tremors, focal weakness and weakness.  Endo/Heme/Allergies: Negative for polydipsia. Does not bruise/bleed easily.  Psychiatric/Behavioral: Negative for depression. The patient is not nervous/anxious and does not have insomnia.      VITAL SIGNS:   Filed Vitals:   06/17/15 0123 06/17/15 0130 06/17/15 0214 06/17/15 0230  BP:  124/86 151/94 160/91  Pulse:  98 98   Temp:  98.9 F (37.2 C)    Resp:  15    Height: 5\' 3"  (1.6 m)     Weight: 76.204 kg (168 lb)     SpO2:  96% 94% 94%   Wt Readings from Last 3 Encounters:  06/17/15 76.204 kg (168 lb)  03/23/15 78.019 kg (172 lb)  03/20/15 78.019 kg (172 lb)    PHYSICAL EXAMINATION:  Physical Exam  Vitals reviewed. Constitutional: She is oriented to person, place, and time. She appears well-developed and well-nourished.  No distress.  HENT:  Head: Normocephalic and atraumatic.  Mouth/Throat: Oropharynx is clear and moist.  Eyes: Conjunctivae and EOM are normal. Pupils are equal, round, and reactive to light. No scleral icterus.  Neck: Normal range of motion. Neck supple. No JVD present. No thyromegaly present.  Cardiovascular: Normal rate, regular rhythm and intact distal pulses.  Exam reveals no gallop and no friction rub.   No murmur heard. Respiratory: Effort normal and breath sounds normal. No respiratory distress. She has no wheezes. She has no rales. She exhibits tenderness (Right sided tenderness to palpation).  GI: Soft. Bowel sounds are normal. She exhibits no distension. There is no tenderness.  Musculoskeletal: Normal range of motion. She exhibits no edema.  No arthritis, no gout  Lymphadenopathy:    She has no cervical adenopathy.  Neurological: She is alert and oriented to person, place, and time. No cranial nerve deficit.  No dysarthria, no aphasia  Skin: Skin is warm and dry. No rash noted. No erythema.  Psychiatric: She has a normal mood and affect. Her behavior is normal. Judgment and thought content  normal.    LABORATORY PANEL:   CBC  Recent Labs Lab 06/17/15 0134  WBC 13.2*  HGB 13.8  HCT 42.6  PLT 320   ------------------------------------------------------------------------------------------------------------------  Chemistries   Recent Labs Lab 06/17/15 0134  NA 138  K 3.9  CL 97*  CO2 29  GLUCOSE 110*  BUN 16  CREATININE 1.01*  CALCIUM 10.6*  AST 31  ALT 23  ALKPHOS 108  BILITOT 1.0   ------------------------------------------------------------------------------------------------------------------  Cardiac Enzymes  Recent Labs Lab 06/17/15 0134  TROPONINI 0.04*   ------------------------------------------------------------------------------------------------------------------  RADIOLOGY:  Dg Chest 2 View  06/17/2015  CLINICAL DATA:  Acute onset of generalized chest pain. Syncope. Initial encounter. EXAM: CHEST  2 VIEW COMPARISON:  Chest radiograph performed 05/07/2011 FINDINGS: The lungs are well-aerated and clear. There is no evidence of focal opacification, pleural effusion or pneumothorax. The heart is normal in size; the mediastinal contour is within normal limits. No acute osseous abnormalities are seen. Cervical spinal fusion hardware is noted. IMPRESSION: No acute cardiopulmonary process seen. Electronically Signed   By: Garald Balding M.D.   On: 06/17/2015 02:28   Ct Head Wo Contrast  06/17/2015  CLINICAL DATA:  Anxiety.  Chest pain and dyspnea. EXAM: CT HEAD WITHOUT CONTRAST TECHNIQUE: Contiguous axial images were obtained from the base of the skull through the vertex without intravenous contrast. COMPARISON:  09/07/2011 FINDINGS: There is no intracranial hemorrhage, mass or evidence of acute infarction. There is no extra-axial fluid collection. Gray matter and white matter appear normal. Cerebral volume is normal for age. Brainstem and posterior fossa are unremarkable. The CSF spaces appear normal. The bony structures are intact. The visible  portions of the paranasal sinuses are clear. IMPRESSION: Normal brain Electronically Signed   By: Andreas Newport M.D.   On: 06/17/2015 03:25   Ct Angio Chest Pe W/cm &/or Wo Cm  06/17/2015  CLINICAL DATA:  Acute onset of shortness of breath. Known right-sided rib fractures. Initial encounter. EXAM: CT ANGIOGRAPHY CHEST WITH CONTRAST TECHNIQUE: Multidetector CT imaging of the chest was performed using the standard protocol during bolus administration of intravenous contrast. Multiplanar CT image reconstructions and MIPs were obtained to evaluate the vascular anatomy. CONTRAST:  80mL OMNIPAQUE IOHEXOL 350 MG/ML SOLN COMPARISON:  Chest radiograph performed earlier today at 2:06 a.m. FINDINGS: There is no evidence of pulmonary embolus. Mild bibasilar atelectasis is noted. The lungs are otherwise clear. There is  no evidence of significant focal consolidation, pleural effusion or pneumothorax. No masses are identified; no abnormal focal contrast enhancement is seen. The mediastinum is unremarkable in appearance. No mediastinal lymphadenopathy is seen. Trace pericardial fluid remains within normal limits. The great vessels are grossly unremarkable in appearance. No axillary lymphadenopathy is seen. The visualized portions of the thyroid gland are unremarkable in appearance. The visualized portions of the liver and spleen are unremarkable. The visualized portions of the pancreas, gallbladder, stomach, adrenal glands and kidneys are within normal limits. Mild nonspecific perinephric stranding is noted bilaterally. No acute osseous abnormalities are seen. Healing fractures of the right eighth through tenth lateral ribs are seen. The more inferior ribs are not imaged on this study. Review of the MIP images confirms the above findings. IMPRESSION: 1. No evidence of pulmonary embolus. 2. Mild bibasilar atelectasis noted.  Lungs otherwise clear. 3. Healing fractures of the right eighth through tenth lateral ribs noted. The  more inferior ribs are not imaged on this study. Electronically Signed   By: Garald Balding M.D.   On: 06/17/2015 03:28    EKG:   Orders placed or performed during the hospital encounter of 06/17/15  . EKG 12-Lead  . EKG 12-Lead    IMPRESSION AND PLAN:  Principal Problem:   Chest pain - EKG without signs of acute ischemia. Troponin barely elevated. See below. Multiple potential etiologies for her chest pain. Pain control on board. Active Problems:   Elevated troponin - barely elevated. We'll trend cardiac enzymes serially. If the rise, can treat and consider cardiology consult. If there is a flat perform back to normal, would consider this less likely to be cardiac in nature.   Fracture, ribs - routine healing seen on CT scan. Continue pain control.   COPD (chronic obstructive pulmonary disease) ([place]) - continue home inhalers   Anxiety - continue home anxiolytics   Adrenal insufficiency ([place]) - patient states she is on prednisone at home, and this is listed in her home meds list but without dose or frequency. We'll need to clarify this. She was given 60 mg by mouth prednisone in the ED. Might consider endocrine consult.   GERD (gastroesophageal reflux disease) - equivalent home dose PPI   Chronic pain - pain control as above.  All the records are reviewed and case discussed with ED provider. Management plans discussed with the patient and/or family.  DVT PROPHYLAXIS: SubQ lovenox  ADMISSION STATUS: Observation  CODE STATUS: Full  TOTAL TIME TAKING CARE OF THIS PATIENT: 40 minutes.    Owens Hara FIELDING 06/17/2015, 4:21 AM  [facility]  Office  [phone]  CC: Primary care physician; Townsend Roger, MD

## 2015-06-17 NOTE — ED Notes (Signed)
Pt arrived via EMS,  pt C/O SOB, lungs clear, O2 98% on room air. Per EMS anxiety increased when fire dept and EMS arrived. Pt C/O 4 broken ribs on R.side, unknown when this occurred

## 2015-06-17 NOTE — Progress Notes (Signed)
Physician notified that Patient requesting a urinalysis to be done. Physician stated that he does not see that it is necessary at this time.

## 2015-06-17 NOTE — ED Notes (Signed)
Patient transported to CT 

## 2015-06-17 NOTE — Progress Notes (Signed)
Patient discharged via wheelchair and private vehicle. All discharged instructions given and patient verbalized understanding. IV discontinued. Tele box removed and returned to tele.

## 2015-06-17 NOTE — ED Notes (Signed)
Pt started on 2L O2

## 2015-06-17 NOTE — Progress Notes (Signed)
Patient transferred up to floor from ED. Tele Verified. Skin assessment done with Lovena Le. Pain and vitals assessed. Fall Surveyor, mining reviewed with patient and patient verbalized understanding. Patient resting in bed with bed alarm on call light in reach. Will continue to monitor.

## 2015-06-19 NOTE — Discharge Summary (Signed)
[facility] [place] NAME: [name]    MR#:  [id number]  DATE OF BIRTH:  [birth year]  DATE OF ADMISSION:  06/17/2015 ADMITTING PHYSICIAN: Max Sane, MD  DATE OF DISCHARGE: 06/17/2015  5:06 PM  PRIMARY CARE PHYSICIAN: Townsend Roger, MD    ADMISSION DIAGNOSIS:  Adrenal insufficiency ([place]) [E27.40] Anxiety [F41.9] Elevated troponin [R79.89] Chest pain, unspecified chest pain type [R07.9]  DISCHARGE DIAGNOSIS:  Principal Problem:   Chest pain Active Problems:   Elevated troponin   Fracture, ribs   GERD (gastroesophageal reflux disease)   COPD (chronic obstructive pulmonary disease) ([facility])   Chronic pain   Anxiety   Adrenal insufficiency ([place])   SECONDARY DIAGNOSIS:   Past Medical History  Diagnosis Date  . Adrenal insufficiency ([place])   . Chronic diastolic CHF (congestive heart failure) ([place])   . GERD (gastroesophageal reflux disease)   . Dementia   . CAD (coronary artery disease)   . COPD (chronic obstructive pulmonary disease) ([place])   . Chronic pain   . Anxiety   . HLD (hyperlipidemia)     HOSPITAL COURSE:  54 yo woman with PMH secondary adrenal insufficiency, admitted with chest pain and shortness of breath. ACS ruled out with serial troponins. She has been hemodynamically stable. Please see Dr Jannifer Franklin' dictated H & P for further details. She was concerned about adrenal insufficiency so Endocrine c/s was obtained who felt It was unlikely that the recent episode resulted from insufficient steroid replacement and so there is no indication for ongoing stress dosing of steroids. They recommended to resume her home regimen of prednisone. She was also advised to follow sick day rules, which she is familiar with. Advised she schedule a follow up visit outpatient with Dr. Joycelyn Schmid in the next 1-2 weeks to review her steroid regimen. Reviewed the adverse effects related to supraphysiologic steroid dosing.  Patient was  feeling back to her baseline and is being D/C home in stable condition. She has very poor psycho-social situation which makes it high likely that she will get readmitted. DISCHARGE CONDITIONS:   stable CONSULTS OBTAINED:    Atha Starks, MD [facility] Endocrinology  DRUG ALLERGIES:   Allergies  Allergen Reactions  . Levaquin [Levofloxacin In D5w] Anaphylaxis  . Amoxicillin Itching  . Codeine Nausea And Vomiting  . Cymbalta [Duloxetine Hcl] Swelling  . Gatifloxacin Hives and Swelling    Other Reaction: Other reaction: Swelling  . Guaifenesin & Derivatives Hives and Swelling  . Keflex [Cephalexin] Other (See Comments)    Unknown  . Klonopin [Clonazepam] Other (See Comments)    "Makes me have bad thoughts"  . Macrobid [employer] Macro] Other (See Comments)    "kidneys shuts down" Chills, Fever.  . Penicillins Hives  . Sulfa Antibiotics Hives  . Amitriptyline Rash    "It makes me want to fall."    DISCHARGE MEDICATIONS:   Discharge Medication List as of 06/17/2015  4:16 PM    CONTINUE these medications which have NOT CHANGED   Details  albuterol (PROAIR HFA) 108 (90 BASE) MCG/ACT inhaler Inhale 2 puffs into the lungs every 6 (six) hours as needed., Starting 06/04/2015, Until Discontinued, Historical Med    ALPRAZolam (XANAX) 1 MG tablet Take 1 tablet by mouth 4 (four) times daily., Until Discontinued, Historical Med    Calcium Carbonate-Vitamin D 600-400 MG-UNIT tablet Take 1 tablet by mouth daily., Starting 05/22/2015, Until Thu 05/21/16, Historical Med    cyclobenzaprine (FLEXERIL) 10 MG tablet Take  1 tablet by mouth 3 (three) times daily as needed., Until Discontinued, Historical Med    fluticasone (FLONASE) 50 MCG/ACT nasal spray Place 2 sprays into the nose daily., Starting 01/02/2013, Until Discontinued, Historical Med    Fluticasone-Salmeterol (ADVAIR DISKUS) 250-50 MCG/DOSE AEPB Inhale 1 puff into the lungs every 12 (twelve) hours., Starting 06/04/2015, Until Wed  06/03/16, Historical Med    furosemide (LASIX) 80 MG tablet Take 1 tablet by mouth daily., Starting 07/18/2014, Until Thu 07/18/15, Historical Med    gabapentin (NEURONTIN) 600 MG tablet Take 1 tablet by mouth 3 (three) times daily., Until Discontinued, Historical Med    HYDROmorphone (DILAUDID) 2 MG tablet Take 1 mg by mouth every 4 (four) hours as needed., Starting 03/23/2015, Until Discontinued, Historical Med    hydrOXYzine (ATARAX/VISTARIL) 25 MG tablet Take 1 tablet by mouth 3 (three) times daily as needed., Starting 01/02/2013, Until Discontinued, Historical Med    omeprazole (PRILOSEC) 40 MG capsule Take 1 capsule by mouth 2 (two) times daily., Starting 02/19/2015, Until Wed 02/19/16, Historical Med    oxyCODONE-acetaminophen (PERCOCET) 10-325 MG tablet Take 1 tablet by mouth every 6 (six) hours as needed (pt has to take [employer] brand). , Starting 10/05/2012, Until Discontinued, Historical Med    prednisoLONE 5 MG TABS tablet Take 20 mg by mouth. 4 tabs daily but may take 8 tablets daily for three days consecutive days and then back to four daily and may try and taper down to 1 daily if able too., Until Discontinued, Historical Med    PREDNISONE PO Take by mouth., Until Discontinued, Historical Med    promethazine (PHENERGAN) 25 MG tablet Take 25 mg by mouth every 8 (eight) hours as needed for nausea or vomiting., Until Discontinued, Historical Med         DISCHARGE INSTRUCTIONS:   DIET:  Cardiac diet  DISCHARGE CONDITION:  Good  ACTIVITY:  Activity as tolerated  OXYGEN:  Home Oxygen: No.   Oxygen Delivery: room air  DISCHARGE LOCATION:  home   If you experience worsening of your admission symptoms, develop shortness of breath, life threatening emergency, suicidal or homicidal thoughts you must seek medical attention immediately by calling 911 or calling your MD immediately  if symptoms less severe.  You Must read complete instructions/literature along with all the  possible adverse reactions/side effects for all the Medicines you take and that have been prescribed to you. Take any new Medicines after you have completely understood and accpet all the possible adverse reactions/side effects.   Please note  You were cared for by a hospitalist during your hospital stay. If you have any questions about your discharge medications or the care you received while you were in the hospital after you are discharged, you can call the unit and asked to speak with the hospitalist on call if the hospitalist that took care of you is not available. Once you are discharged, your primary care physician will handle any further medical issues. Please note that NO REFILLS for any discharge medications will be authorized once you are discharged, as it is imperative that you return to your primary care physician (or establish a relationship with a primary care physician if you do not have one) for your aftercare needs so that they can reassess your need for medications and monitor your lab values.    On the day of Discharge:  VITAL SIGNS:  Blood pressure 135/65, pulse 106, temperature 98.4 F (36.9 C), temperature source Oral, resp. rate 22, height 5\' 3"  (1.6  m), weight 76.204 kg (168 lb), SpO2 96 %. PHYSICAL EXAMINATION:  GENERAL:  54 y.o.-year-old patient lying in the bed with no acute distress.  EYES: Pupils equal, round, reactive to light and accommodation. No scleral icterus. Extraocular muscles intact.  HEENT: Head atraumatic, normocephalic. Oropharynx and nasopharynx clear.  NECK:  Supple, no jugular venous distention. No thyroid enlargement, no tenderness.  LUNGS: Normal breath sounds bilaterally, no wheezing, rales,rhonchi or crepitation. No use of accessory muscles of respiration.  CARDIOVASCULAR: S1, S2 normal. No murmurs, rubs, or gallops.  ABDOMEN: Soft, non-tender, non-distended. Bowel sounds present. No organomegaly or mass.  EXTREMITIES: No pedal edema, cyanosis, or  clubbing.  NEUROLOGIC: Cranial nerves II through XII are intact. Muscle strength 5/5 in all extremities. Sensation intact. Gait not checked.  PSYCHIATRIC: The patient is alert and oriented x 3.  SKIN: No obvious rash, lesion, or ulcer.  DATA REVIEW:   CBC  Recent Labs Lab 06/17/15 0134  WBC 13.2*  HGB 13.8  HCT 42.6  PLT 320    Chemistries   Recent Labs Lab 06/17/15 0134  NA 138  K 3.9  CL 97*  CO2 29  GLUCOSE 110*  BUN 16  CREATININE 1.01*  CALCIUM 10.6*  AST 31  ALT 23  ALKPHOS 108  BILITOT 1.0    Cardiac Enzymes  Recent Labs Lab 06/17/15 1340  TROPONINI 0.04*    Microbiology Results  Results for orders placed or performed during the hospital encounter of 03/20/15  Culture, blood (routine x 2)     Status: None   Collection Time: 03/20/15  3:28 PM  Result Value Ref Range Status   Specimen Description BLOOD RIGHT ARM  Final   Special Requests BAA,5MLAER,5MLANA  Final   Culture NO GROWTH 5 DAYS  Final   Report Status 03/25/2015 FINAL  Final    RADIOLOGY:  No results found.   Management plans discussed with the patient, family and they are in agreement.  CODE STATUS:   TOTAL TIME TAKING CARE OF THIS PATIENT: 55 minutes.    [facility], Campbell Agramonte M.D on 06/19/2015 at 5:54 PM  Between 7am to 6pm - Pager - [phone]  After 6pm go to [email or web address] - password [email or web address] [facility]  Office  [phone]  CC: Primary care physician; Townsend Roger, MD Atha Starks, MD [facility] Endocrinology)  Note: This dictation was prepared with Dragon dictation along with smaller phrase technology. Any transcriptional errors that result from this process are unintentional.

## 2015-07-21 HISTORY — PX: OTHER SURGICAL HISTORY: SHX169

## 2015-11-05 DIAGNOSIS — C4372 Malignant melanoma of left lower limb, including hip: Secondary | ICD-10-CM | POA: Insufficient documentation

## 2016-04-28 ENCOUNTER — Other Ambulatory Visit: Payer: Self-pay | Admitting: Internal Medicine

## 2016-04-28 DIAGNOSIS — Z1231 Encounter for screening mammogram for malignant neoplasm of breast: Secondary | ICD-10-CM

## 2016-05-22 ENCOUNTER — Ambulatory Visit: Payer: Medicaid Other

## 2016-06-02 ENCOUNTER — Ambulatory Visit: Payer: Medicaid Other

## 2016-07-21 DIAGNOSIS — Z9181 History of falling: Secondary | ICD-10-CM | POA: Insufficient documentation

## 2016-07-21 DIAGNOSIS — G603 Idiopathic progressive neuropathy: Secondary | ICD-10-CM | POA: Insufficient documentation

## 2016-07-21 DIAGNOSIS — F513 Sleepwalking [somnambulism]: Secondary | ICD-10-CM | POA: Insufficient documentation

## 2016-07-21 DIAGNOSIS — G4731 Primary central sleep apnea: Secondary | ICD-10-CM | POA: Insufficient documentation

## 2016-07-21 DIAGNOSIS — M8580 Other specified disorders of bone density and structure, unspecified site: Secondary | ICD-10-CM | POA: Insufficient documentation

## 2016-08-20 DIAGNOSIS — H903 Sensorineural hearing loss, bilateral: Secondary | ICD-10-CM | POA: Insufficient documentation

## 2016-08-20 DIAGNOSIS — J322 Chronic ethmoidal sinusitis: Secondary | ICD-10-CM | POA: Insufficient documentation

## 2016-09-24 DIAGNOSIS — R1313 Dysphagia, pharyngeal phase: Secondary | ICD-10-CM | POA: Insufficient documentation

## 2016-09-24 DIAGNOSIS — L97922 Non-pressure chronic ulcer of unspecified part of left lower leg with fat layer exposed: Secondary | ICD-10-CM | POA: Insufficient documentation

## 2016-09-24 DIAGNOSIS — I872 Venous insufficiency (chronic) (peripheral): Secondary | ICD-10-CM | POA: Insufficient documentation

## 2016-10-28 DIAGNOSIS — G479 Sleep disorder, unspecified: Secondary | ICD-10-CM | POA: Insufficient documentation

## 2016-11-16 DIAGNOSIS — G629 Polyneuropathy, unspecified: Secondary | ICD-10-CM | POA: Insufficient documentation

## 2017-01-07 DIAGNOSIS — M5417 Radiculopathy, lumbosacral region: Secondary | ICD-10-CM | POA: Insufficient documentation

## 2017-02-09 DIAGNOSIS — M545 Low back pain, unspecified: Secondary | ICD-10-CM | POA: Insufficient documentation

## 2017-02-16 DIAGNOSIS — F0280 Dementia in other diseases classified elsewhere without behavioral disturbance: Secondary | ICD-10-CM | POA: Insufficient documentation

## 2017-05-15 ENCOUNTER — Encounter: Payer: Self-pay | Admitting: Emergency Medicine

## 2017-05-15 ENCOUNTER — Emergency Department: Payer: Medicare Other

## 2017-05-15 ENCOUNTER — Emergency Department
Admission: EM | Admit: 2017-05-15 | Discharge: 2017-05-15 | Disposition: A | Discharge status: Other Acute Inpatient Hospital | Payer: Medicare Other | Attending: Emergency Medicine | Admitting: Emergency Medicine

## 2017-05-15 DIAGNOSIS — R509 Fever, unspecified: Secondary | ICD-10-CM | POA: Insufficient documentation

## 2017-05-15 DIAGNOSIS — Z79899 Other long term (current) drug therapy: Secondary | ICD-10-CM | POA: Insufficient documentation

## 2017-05-15 DIAGNOSIS — M79641 Pain in right hand: Secondary | ICD-10-CM | POA: Diagnosis present

## 2017-05-15 DIAGNOSIS — J449 Chronic obstructive pulmonary disease, unspecified: Secondary | ICD-10-CM | POA: Insufficient documentation

## 2017-05-15 DIAGNOSIS — A419 Sepsis, unspecified organism: Secondary | ICD-10-CM | POA: Insufficient documentation

## 2017-05-15 DIAGNOSIS — M659 Synovitis and tenosynovitis, unspecified: Secondary | ICD-10-CM | POA: Insufficient documentation

## 2017-05-15 DIAGNOSIS — M6518 Other infective (teno)synovitis, other site: Secondary | ICD-10-CM | POA: Diagnosis not present

## 2017-05-15 DIAGNOSIS — F17210 Nicotine dependence, cigarettes, uncomplicated: Secondary | ICD-10-CM | POA: Insufficient documentation

## 2017-05-15 DIAGNOSIS — I259 Chronic ischemic heart disease, unspecified: Secondary | ICD-10-CM | POA: Diagnosis not present

## 2017-05-15 DIAGNOSIS — I5032 Chronic diastolic (congestive) heart failure: Secondary | ICD-10-CM | POA: Insufficient documentation

## 2017-05-15 DIAGNOSIS — E249 Cushing's syndrome, unspecified: Secondary | ICD-10-CM | POA: Insufficient documentation

## 2017-05-15 DIAGNOSIS — Z7952 Long term (current) use of systemic steroids: Secondary | ICD-10-CM | POA: Insufficient documentation

## 2017-05-15 HISTORY — PX: OTHER SURGICAL HISTORY: SHX169

## 2017-05-15 HISTORY — DX: Pituitary-dependent Cushing's disease: E24.0

## 2017-05-15 LAB — URINALYSIS, COMPLETE (UACMP) WITH MICROSCOPIC
Bilirubin Urine: NEGATIVE
GLUCOSE, UA: NEGATIVE mg/dL
Hgb urine dipstick: NEGATIVE
Ketones, ur: NEGATIVE mg/dL
Leukocytes, UA: NEGATIVE
Nitrite: NEGATIVE
PH: 6 (ref 5.0–8.0)
Protein, ur: NEGATIVE mg/dL
Specific Gravity, Urine: 1.003 — ABNORMAL LOW (ref 1.005–1.030)

## 2017-05-15 LAB — COMPREHENSIVE METABOLIC PANEL
ALK PHOS: 113 U/L (ref 38–126)
ALT: 24 U/L (ref 14–54)
ANION GAP: 14 (ref 5–15)
AST: 27 U/L (ref 15–41)
Albumin: 3.7 g/dL (ref 3.5–5.0)
BUN: 10 mg/dL (ref 6–20)
CALCIUM: 9.1 mg/dL (ref 8.9–10.3)
CO2: 27 mmol/L (ref 22–32)
CREATININE: 0.9 mg/dL (ref 0.44–1.00)
Chloride: 95 mmol/L — ABNORMAL LOW (ref 101–111)
Glucose, Bld: 179 mg/dL — ABNORMAL HIGH (ref 65–99)
Potassium: 2.9 mmol/L — ABNORMAL LOW (ref 3.5–5.1)
SODIUM: 136 mmol/L (ref 135–145)
Total Bilirubin: 0.8 mg/dL (ref 0.3–1.2)
Total Protein: 7.7 g/dL (ref 6.5–8.1)

## 2017-05-15 LAB — CBC WITH DIFFERENTIAL/PLATELET
BASOS ABS: 0.1 10*3/uL (ref 0–0.1)
BASOS PCT: 0 %
EOS ABS: 0.2 10*3/uL (ref 0–0.7)
Eosinophils Relative: 1 %
HEMATOCRIT: 35.7 % (ref 35.0–47.0)
HEMOGLOBIN: 11.9 g/dL — AB (ref 12.0–16.0)
Lymphocytes Relative: 8 %
Lymphs Abs: 1.4 10*3/uL (ref 1.0–3.6)
MCH: 26.4 pg (ref 26.0–34.0)
MCHC: 33.4 g/dL (ref 32.0–36.0)
MCV: 78.8 fL — ABNORMAL LOW (ref 80.0–100.0)
MONOS PCT: 7 %
Monocytes Absolute: 1.1 10*3/uL — ABNORMAL HIGH (ref 0.2–0.9)
NEUTROS ABS: 13.9 10*3/uL — AB (ref 1.4–6.5)
NEUTROS PCT: 84 %
PLATELETS: 320 10*3/uL (ref 150–440)
RBC: 4.53 MIL/uL (ref 3.80–5.20)
RDW: 19.9 % — AB (ref 11.5–14.5)
WBC: 16.6 10*3/uL — ABNORMAL HIGH (ref 3.6–11.0)

## 2017-05-15 LAB — PROCALCITONIN: Procalcitonin: <0.1 ng/mL

## 2017-05-15 LAB — LACTIC ACID, PLASMA: Lactic Acid, Venous: 2.7 mmol/L (ref 0.5–1.9)

## 2017-05-15 LAB — INFLUENZA PANEL BY PCR (TYPE A & B)
INFLBPCR: NEGATIVE
Influenza A By PCR: NEGATIVE

## 2017-05-15 MED ORDER — VANCOMYCIN HCL IN DEXTROSE 1-5 GM/200ML-% IV SOLN
1000.0000 mg | Freq: Once | INTRAVENOUS | Status: AC
  Administered 2017-05-15: 1000 mg via INTRAVENOUS
  Filled 2017-05-15: qty 200

## 2017-05-15 MED ORDER — HYDROMORPHONE HCL 1 MG/ML IJ SOLN
1.0000 mg | Freq: Once | INTRAMUSCULAR | Status: AC
  Administered 2017-05-15: 1 mg via INTRAVENOUS
  Filled 2017-05-15: qty 1

## 2017-05-15 MED ORDER — ACETAMINOPHEN 500 MG PO TABS
1000.0000 mg | ORAL_TABLET | Freq: Once | ORAL | Status: AC
  Administered 2017-05-15: 1000 mg via ORAL
  Filled 2017-05-15: qty 2

## 2017-05-15 MED ORDER — SODIUM CHLORIDE 0.9 % IV BOLUS (SEPSIS)
1000.0000 mL | Freq: Once | INTRAVENOUS | Status: AC
  Administered 2017-05-15: 1000 mL via INTRAVENOUS

## 2017-05-15 MED ORDER — CEFTRIAXONE SODIUM IN DEXTROSE 20 MG/ML IV SOLN
1.0000 g | Freq: Once | INTRAVENOUS | Status: AC
  Administered 2017-05-15: 1 g via INTRAVENOUS
  Filled 2017-05-15: qty 50

## 2017-05-15 NOTE — ED Provider Notes (Signed)
[name]  ____________________________________________   First MD Initiated Contact with Patient 05/15/17 0740     (approximate)  I have reviewed the triage vital signs and the nursing notes.   HISTORY  Chief Complaint Hand Pain; Fever; and Generalized Body Aches   HPI [name] is a 56 y.o. female who self presents to the emergency department with fever to 103 degrees yesterday along with atraumatic painful swelling of her right index finger.  She is right-hand dominant.  She has a complex past medical history including Cushing's disease for which she takes 20 mg of prednisone daily.  She is concerned that she may have broken her index finger despite no particular trauma.  She also reports roughly 1 week of nonproductive cough and is worried she may have influenza.  She has severe aching pain in her right index finger worse with touching or any sort of movement whatsoever and improved somewhat with rest.  Past Medical History:  Diagnosis Date  . Adrenal insufficiency ([place])   . Anxiety   . CAD (coronary artery disease)   . Chronic diastolic CHF (congestive heart failure) ([place])   . Chronic pain   . COPD (chronic obstructive pulmonary disease) ([place])   . Cushing disease ([place])   . Dementia   . GERD (gastroesophageal reflux disease)   . HLD (hyperlipidemia)     Patient Active Problem List   Diagnosis Date Noted  . Chest pain 06/17/2015  . Elevated troponin 06/17/2015  . Fracture, ribs 06/17/2015  . GERD (gastroesophageal reflux disease) 06/17/2015  . COPD (chronic obstructive pulmonary disease) ([place]) 06/17/2015  . Chronic pain 06/17/2015  . Anxiety 06/17/2015  . Adrenal insufficiency ([place]) 06/17/2015    Past Surgical History:  Procedure Laterality Date  . APPENDECTOMY    . NASAL SEPTUM SURGERY    . NASAL SINUS SURGERY    . UMBILICAL HERNIA REPAIR    . VAGINAL HYSTERECTOMY      Prior to Admission  medications   Medication Sig Start Date End Date Taking? Authorizing Provider  albuterol (PROAIR HFA) 108 (90 BASE) MCG/ACT inhaler Inhale 2 puffs into the lungs every 6 (six) hours as needed. 06/04/15   [provider]  ALPRAZolam Duanne Moron) 1 MG tablet Take 1 tablet by mouth 4 (four) times daily.    [provider]  Calcium Carbonate-Vitamin D 600-400 MG-UNIT tablet Take 1 tablet by mouth daily. 05/22/15 05/21/16  [provider]  cyclobenzaprine (FLEXERIL) 10 MG tablet Take 1 tablet by mouth 3 (three) times daily as needed.    [provider]  fluticasone (FLONASE) 50 MCG/ACT nasal spray Place 2 sprays into the nose daily. 01/02/13   [provider]  Fluticasone-Salmeterol (ADVAIR DISKUS) 250-50 MCG/DOSE AEPB Inhale 1 puff into the lungs every 12 (twelve) hours. 06/04/15 06/03/16  [provider]  furosemide (LASIX) 80 MG tablet Take 1 tablet by mouth daily. 07/18/14 07/18/15  [provider]  gabapentin (NEURONTIN) 600 MG tablet Take 1 tablet by mouth 3 (three) times daily.    [provider]  HYDROmorphone (DILAUDID) 2 MG tablet Take 1 mg by mouth every 4 (four) hours as needed. 03/23/15   [provider]  hydrOXYzine (ATARAX/VISTARIL) 25 MG tablet Take 1 tablet by mouth 3 (three) times daily as needed. 01/02/13   [provider]  omeprazole (PRILOSEC) 40 MG capsule Take 1 capsule by mouth 2 (two) times daily. 02/19/15 02/19/16  [provider]  oxyCODONE-acetaminophen ([facility]) 31-497  MG tablet Take 1 tablet by mouth every 6 (six) hours as needed (pt has to take [facility] brand).  10/05/12   [provider]  prednisoLONE 5 MG TABS tablet Take 20 mg by mouth. 4 tabs daily but may take 8 tablets daily for three days consecutive days and then back to four daily and may try and taper down to 1 daily if able too.    [provider]  PREDNISONE PO Take by mouth.    [provider]   promethazine (PHENERGAN) 25 MG tablet Take 25 mg by mouth every 8 (eight) hours as needed for nausea or vomiting.    [provider]    Allergies Levaquin [levofloxacin in d5w]; Amoxicillin; Codeine; Cymbalta [duloxetine hcl]; Gatifloxacin; Guaifenesin & derivatives; Keflex [cephalexin]; Klonopin [clonazepam]; Macrobid [nitrofurantoin monohyd macro]; Penicillins; Sulfa antibiotics; and Amitriptyline  Family History  Problem Relation Age of Onset  . Ovarian cancer Mother   . Breast cancer Mother   . Brain cancer Mother   . CAD Father   . Diabetes Father   . Hepatitis Father   . Liver disease Father   . Cancer Father   . Alcohol abuse Father   . Heart attack Brother   . Sleep apnea Brother   . Diabetes Brother   . ALS Paternal Grandmother   . Stroke Maternal Grandmother     Social History Social History  Substance Use Topics  . Smoking status: Current Every Day Smoker    Packs/day: 0.50    Types: Cigarettes  . Smokeless tobacco: Never Used  . Alcohol use 0.0 oz/week     Comment: rarely    Review of Systems Constitutional: Positive fevers Eyes: No visual changes. ENT: No sore throat. Cardiovascular: Denies chest pain. Respiratory: Positive for shortness of breath. Gastrointestinal: No abdominal pain.  No nausea, no vomiting.  No diarrhea.  No constipation. Genitourinary: Negative for dysuria. Musculoskeletal: Positive for finger pain Skin: Positive for rash on right index finger Neurological: Negative for headaches, focal weakness or numbness.   ____________________________________________   PHYSICAL EXAM:  VITAL SIGNS: ED Triage Vitals  Enc Vitals Group     BP 05/15/17 0706 136/75     Pulse Rate 05/15/17 0706 (!) 115     Resp 05/15/17 0706 18     Temp 05/15/17 0706 99.1 F (37.3 C)     Temp Source 05/15/17 0706 Oral     SpO2 05/15/17 0706 94 %     Weight 05/15/17 0711 167 lb (75.8 kg)     Height 05/15/17 0711 5\' 4"  (1.626 m)     Head  Circumference --      Peak Flow --      Pain Score 05/15/17 0710 10     Pain Loc --      Pain Edu? --      Excl. in [place]? --     Constitutional: Alert and oriented x4 tearful and uncomfortable appearing no respiratory distress Eyes: PERRL EOMI. Head: Atraumatic. Nose: No congestion/rhinnorhea. Mouth/Throat: No trismus Neck: No stridor.   Cardiovascular: Tachycardic rate, regular rhythm. Grossly normal heart sounds.  Good peripheral circulation. Respiratory: Normal respiratory effort.  No retractions.  Slight wheeze worse on Gastrointestinal: Soft nontender Musculoskeletal:  Right index finger held in flexion with fusiform swelling and significant discomfort when I passively extend her finger along with tenderness over the flexor sheath.  The finger itself is erythematous and exquisitely tender No tenderness over distal radius or distal ulna. No tenderness over snuffbox and  no axial load discomfort Sensation intact to light touch over first dorsal webspace, distal index finger, distal small finger Can flex and oppose  thumb, cross 2 on 3, and extend wrist 2+ radial pulse and less than 2 second capillary refill Skin is closed Compartments are soft Neurologic:  Normal speech and language. No gross focal neurologic deficits are appreciated. Skin:  Skin is warm, dry and intact. No rash noted. Psychiatric: Mood and affect are normal. Speech and behavior are normal.    ____________________________________________   DIFFERENTIAL includes but not limited to  Flexor tenosynovitis, sepsis, osteomyelitis, felon, pneumonia, influenza ____________________________________________   LABS (all labs ordered are listed, but only abnormal results are displayed)  Labs Reviewed  LACTIC ACID, PLASMA - Abnormal; Notable for the following:       Result Value   Lactic Acid, Venous 2.7 (*)    All other components within normal limits  COMPREHENSIVE METABOLIC PANEL - Abnormal; Notable for the  following:    Potassium 2.9 (*)    Chloride 95 (*)    Glucose, Bld 179 (*)    All other components within normal limits  CBC WITH DIFFERENTIAL/PLATELET - Abnormal; Notable for the following:    WBC 16.6 (*)    Hemoglobin 11.9 (*)    MCV 78.8 (*)    RDW 19.9 (*)    Neutro Abs 13.9 (*)    Monocytes Absolute 1.1 (*)    All other components within normal limits  URINALYSIS, COMPLETE (UACMP) WITH MICROSCOPIC - Abnormal; Notable for the following:    Color, Urine COLORLESS (*)    APPearance CLEAR (*)    Specific Gravity, Urine 1.003 (*)    Bacteria, UA RARE (*)    Squamous Epithelial / LPF 0-5 (*)    All other components within normal limits  CULTURE, BLOOD (ROUTINE X 2)  CULTURE, BLOOD (ROUTINE X 2)  INFLUENZA PANEL BY PCR (TYPE A & B)  LACTIC ACID, PLASMA  PROCALCITONIN    Blood work reviewed and interpreted by me shows elevated white count as well as an elevated lactic acid which are both concerning for active infection.  Slightly low potassium is likely secondary to stress __________________________________________  EKG   ____________________________________________  RADIOLOGY  Hand x-ray reviewed by me shows no fracture but does show soft tissue swelling on the volar surface of the index finger ____________________________________________   PROCEDURES  Procedure(s) performed: no  Procedures  Critical Care performed: yes  CRITICAL CARE Performed by: Darel Hong   Total critical care time: 35 minutes  Critical care time was exclusive of separately billable procedures and treating other patients.  Critical care was necessary to treat or prevent imminent or life-threatening deterioration.  Critical care was time spent personally by me on the following activities: development of treatment plan with patient and/or surrogate as well as nursing, discussions with consultants, evaluation of patient's response to treatment, examination of patient, obtaining history  from patient or surrogate, ordering and performing treatments and interventions, ordering and review of laboratory studies, ordering and review of radiographic studies, pulse oximetry and re-evaluation of patient's condition.   Observation: no ____________________________________________   INITIAL IMPRESSION / ASSESSMENT AND PLAN / ED COURSE  Pertinent labs & imaging results that were available during my care of the patient were reviewed by me and considered in my medical decision making (see chart for details).  The patient arrives with a low-grade fever and slightly tachycardic.  She clearly has 2 complaints most concerning is her swollen tender right  index finger.  The patient is immune compromised taking 20 mg of prednisone daily and her finger is held in flexion and is tender over the flexor sheath although it is difficult to examine secondary to allodynia.  I do have some concern for flexor tenosynovitis especially considering there is an obvious wound to the distal aspect of that finger.  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  My concern is a high risk for a limb threatening injury, so we will give a third generation cephalosporin slowly so as to evaluate for cross reactivity as well as vancomycin.      ----------------------------------------- 8:17 AM on 05/15/2017 -----------------------------------------  Wet read of the x-ray by me shows soft tissue swelling but no fracture.  I am still concerned the patient may have a flexor tenosynovitis in addition to fluid resuscitation will recheck orthopedic surgery now. ____________________________________________  ----------------------------------------- 8:20 AM on 05/15/2017 -----------------------------------------  I discussed the case with on-call general surgeon Dr. Mack Guise who recommended transfer to a hospital with a  hand surgeon.  The patient prefers [facility].  ----------------------------------------- 8:53 AM on 05/15/2017 -----------------------------------------  Patient's pain is moderately improved although her erythema has clearly spread since the last time I evaluated her.  Antibiotics are going now as well as fluids.  She has been accepted to [facility] ER [facility] ER transfer by orthopedic surgeon Dr. Rhea Pink.  The patient consents to transfer.  She has been medically stabilized although remains in serious condition.  FINAL CLINICAL IMPRESSION(S) / ED DIAGNOSES  Final diagnoses:  Flexor tenosynovitis of finger  Sepsis, due to unspecified organism [facility])      NEW MEDICATIONS STARTED DURING THIS VISIT:  New Prescriptions   No medications on file     Beck:  This document was prepared using Dragon voice recognition software and may include unintentional dictation errors.     Darel Hong, MD 05/15/17 [phone]

## 2017-05-15 NOTE — ED Triage Notes (Signed)
Patient presents to the ED with report of fever of 103 and 104 yesterday and generalized body ache with productive cough.  Patient states, "I feel like I might have the flu, but what is really bothering me is my hand."  Patient's forefinger on her right hand appears swollen and reddened.  Patient states she was unable to sleep last night due to the pain.  Patient reports swelling began yesterday.  Patient states she has Cushing Disease and can break bones without her knowledge.  Patient denies any known injury to hand.  Patient also reports concern regarding to places that her dermatologist removed on Thursday.  Patient states, "I haven't been able to clean them like I normally would because of this hand pain."

## 2017-05-20 LAB — CULTURE, BLOOD (ROUTINE X 2)
CULTURE: NO GROWTH
Culture: NO GROWTH
Special Requests: ADEQUATE
Special Requests: ADEQUATE

## 2017-07-26 DIAGNOSIS — E2749 Other adrenocortical insufficiency: Secondary | ICD-10-CM | POA: Insufficient documentation

## 2017-07-27 HISTORY — PX: OTHER SURGICAL HISTORY: SHX169

## 2017-08-31 ENCOUNTER — Other Ambulatory Visit: Payer: Self-pay

## 2017-08-31 ENCOUNTER — Encounter: Payer: Self-pay | Admitting: Emergency Medicine

## 2017-08-31 ENCOUNTER — Emergency Department
Admission: EM | Admit: 2017-08-31 | Discharge: 2017-08-31 | Disposition: A | Discharge status: Home / Self Care | Payer: Medicare Other | Attending: Emergency Medicine | Admitting: Emergency Medicine

## 2017-08-31 ENCOUNTER — Emergency Department: Payer: Medicare Other

## 2017-08-31 DIAGNOSIS — R1030 Lower abdominal pain, unspecified: Secondary | ICD-10-CM

## 2017-08-31 DIAGNOSIS — I5032 Chronic diastolic (congestive) heart failure: Secondary | ICD-10-CM | POA: Insufficient documentation

## 2017-08-31 DIAGNOSIS — E876 Hypokalemia: Secondary | ICD-10-CM | POA: Diagnosis not present

## 2017-08-31 DIAGNOSIS — F17210 Nicotine dependence, cigarettes, uncomplicated: Secondary | ICD-10-CM | POA: Diagnosis not present

## 2017-08-31 DIAGNOSIS — Z85828 Personal history of other malignant neoplasm of skin: Secondary | ICD-10-CM | POA: Diagnosis not present

## 2017-08-31 DIAGNOSIS — J449 Chronic obstructive pulmonary disease, unspecified: Secondary | ICD-10-CM | POA: Diagnosis not present

## 2017-08-31 DIAGNOSIS — R110 Nausea: Secondary | ICD-10-CM | POA: Insufficient documentation

## 2017-08-31 DIAGNOSIS — F0390 Unspecified dementia without behavioral disturbance: Secondary | ICD-10-CM | POA: Diagnosis not present

## 2017-08-31 DIAGNOSIS — I2510 Atherosclerotic heart disease of native coronary artery without angina pectoris: Secondary | ICD-10-CM | POA: Diagnosis not present

## 2017-08-31 DIAGNOSIS — N939 Abnormal uterine and vaginal bleeding, unspecified: Secondary | ICD-10-CM | POA: Diagnosis present

## 2017-08-31 DIAGNOSIS — R739 Hyperglycemia, unspecified: Secondary | ICD-10-CM | POA: Insufficient documentation

## 2017-08-31 DIAGNOSIS — R140 Abdominal distension (gaseous): Secondary | ICD-10-CM | POA: Diagnosis not present

## 2017-08-31 DIAGNOSIS — Z880 Allergy status to penicillin: Secondary | ICD-10-CM | POA: Diagnosis not present

## 2017-08-31 DIAGNOSIS — Z79899 Other long term (current) drug therapy: Secondary | ICD-10-CM | POA: Insufficient documentation

## 2017-08-31 HISTORY — DX: Malignant (primary) neoplasm, unspecified: C80.1

## 2017-08-31 LAB — BASIC METABOLIC PANEL
Anion gap: 20 — ABNORMAL HIGH (ref 5–15)
BUN: 14 mg/dL (ref 6–20)
CO2: 24 mmol/L (ref 22–32)
Calcium: 9.4 mg/dL (ref 8.9–10.3)
Chloride: 91 mmol/L — ABNORMAL LOW (ref 101–111)
Creatinine, Ser: 1.21 mg/dL — ABNORMAL HIGH (ref 0.44–1.00)
GFR calc Af Amer: 57 mL/min — ABNORMAL LOW (ref >60)
GFR, EST NON AFRICAN AMERICAN: 49 mL/min — AB (ref >60)
GLUCOSE: 249 mg/dL — AB (ref 65–99)
POTASSIUM: 2.9 mmol/L — AB (ref 3.5–5.1)
SODIUM: 135 mmol/L (ref 135–145)

## 2017-08-31 LAB — HEPATIC FUNCTION PANEL
ALBUMIN: 3.9 g/dL (ref 3.5–5.0)
ALK PHOS: 124 U/L (ref 38–126)
ALT: 23 U/L (ref 14–54)
AST: 31 U/L (ref 15–41)
BILIRUBIN TOTAL: 0.7 mg/dL (ref 0.3–1.2)
Bilirubin, Direct: <0.1 mg/dL — ABNORMAL LOW (ref 0.1–0.5)
TOTAL PROTEIN: 8.2 g/dL — AB (ref 6.5–8.1)

## 2017-08-31 LAB — CBC
HCT: 40.2 % (ref 35.0–47.0)
Hemoglobin: 12.7 g/dL (ref 12.0–16.0)
MCH: 24.6 pg — AB (ref 26.0–34.0)
MCHC: 31.7 g/dL — AB (ref 32.0–36.0)
MCV: 77.6 fL — AB (ref 80.0–100.0)
Platelets: 477 10*3/uL — ABNORMAL HIGH (ref 150–440)
RBC: 5.18 MIL/uL (ref 3.80–5.20)
RDW: 17.2 % — ABNORMAL HIGH (ref 11.5–14.5)
WBC: 14.7 10*3/uL — ABNORMAL HIGH (ref 3.6–11.0)

## 2017-08-31 LAB — LACTIC ACID, PLASMA
Lactic Acid, Venous: 2.9 mmol/L (ref 0.5–1.9)
Lactic Acid, Venous: 2.3 mmol/L (ref 0.5–1.9)

## 2017-08-31 LAB — URINALYSIS, COMPLETE (UACMP) WITH MICROSCOPIC
Bacteria, UA: NONE SEEN
Bilirubin Urine: NEGATIVE
GLUCOSE, UA: NEGATIVE mg/dL
HGB URINE DIPSTICK: NEGATIVE
Ketones, ur: NEGATIVE mg/dL
Leukocytes, UA: NEGATIVE
Nitrite: NEGATIVE
Protein, ur: NEGATIVE mg/dL
RBC / HPF: NONE SEEN RBC/hpf (ref 0–5)
SPECIFIC GRAVITY, URINE: 1.002 — AB (ref 1.005–1.030)
pH: 7 (ref 5.0–8.0)

## 2017-08-31 MED ORDER — SODIUM CHLORIDE 0.9 % IV BOLUS (SEPSIS)
1000.0000 mL | Freq: Once | INTRAVENOUS | Status: AC
  Administered 2017-08-31: 1000 mL via INTRAVENOUS

## 2017-08-31 MED ORDER — HYDROMORPHONE HCL 1 MG/ML IJ SOLN
0.5000 mg | Freq: Once | INTRAMUSCULAR | Status: AC
  Administered 2017-08-31: 0.5 mg via INTRAVENOUS
  Filled 2017-08-31: qty 1

## 2017-08-31 MED ORDER — SODIUM CHLORIDE 0.9 % IV SOLN
Freq: Once | INTRAVENOUS | Status: AC
  Administered 2017-08-31: 1000 mL via INTRAVENOUS

## 2017-08-31 MED ORDER — POTASSIUM CHLORIDE CRYS ER 20 MEQ PO TBCR
40.0000 meq | EXTENDED_RELEASE_TABLET | Freq: Once | ORAL | Status: AC
  Administered 2017-08-31: 40 meq via ORAL
  Filled 2017-08-31: qty 2

## 2017-08-31 MED ORDER — ONDANSETRON HCL 4 MG/2ML IJ SOLN
4.0000 mg | Freq: Once | INTRAMUSCULAR | Status: AC
  Administered 2017-08-31: 4 mg via INTRAVENOUS
  Filled 2017-08-31: qty 2

## 2017-08-31 MED ORDER — METRONIDAZOLE 500 MG PO TABS: 500.0000 mg | ORAL_TABLET | Freq: Three times a day (TID) | ORAL | 0 refills | Status: DC

## 2017-08-31 MED ORDER — CEPHALEXIN 500 MG PO CAPS: 500.0000 mg | ORAL_CAPSULE | Freq: Once | ORAL | Status: DC

## 2017-08-31 MED ORDER — IOPAMIDOL (ISOVUE-300) INJECTION 61%
30.0000 mL | Freq: Once | INTRAVENOUS | Status: DC | PRN
  Filled 2017-08-31: qty 30

## 2017-08-31 MED ORDER — SODIUM CHLORIDE 0.9 % IV SOLN: 1.0000 g | Freq: Once | INTRAVENOUS | Status: DC

## 2017-08-31 MED ORDER — DIPHENHYDRAMINE HCL 50 MG/ML IJ SOLN: 25.0000 mg | Freq: Once | INTRAMUSCULAR | Status: DC

## 2017-08-31 MED ORDER — IOPAMIDOL (ISOVUE-300) INJECTION 61%
100.0000 mL | Freq: Once | INTRAVENOUS | Status: AC | PRN
  Administered 2017-08-31: 100 mL via INTRAVENOUS
  Filled 2017-08-31: qty 100

## 2017-08-31 NOTE — ED Notes (Addendum)
Reviewed with pt the importance to call her PCP to review her visit today pt reports every time she gets s new medications she has a reaction to it. Pt reports her brother helps her. Enc to have med list at all times with allergies listed, enc pt to talk to PCP for further f/u may see an Infectious disease specialist pt reports recurrent infections and usually severe reaction to antibiotics

## 2017-08-31 NOTE — ED Notes (Signed)
Dr. Jimmye Norman aware of patient hx of CHF. Orders to infuse the second bag of NS now at bolus rate.

## 2017-08-31 NOTE — ED Notes (Signed)
Date and time results received: 08/31/17 6:27 PM   Test: lactic acid Critical Value: 2.3  Name of Provider Notified: Dr. Lenise Arena  Orders Received? Or Actions Taken?:

## 2017-08-31 NOTE — ED Provider Notes (Signed)
[facility] Emergency Department Provider Note ____________________________________________   I have reviewed the triage vital signs and the triage nursing note.  HISTORY  Chief Complaint Vaginal Bleeding   Historian Patient  HPI [name] is a 57 y.o. female presents with a complaint of vaginal bleeding since Saturday, approximately 4 days now.  States that she had a hysterectomy several years ago complicated by complete vaginal cuff dehiscence and has some sort of chronic discomfort down low in her pelvis.  She has not had vaginal bleeding since then.  She has had increased lower abdominal pain since Saturday.  Feels nausea without vomiting, and bloating without constipation or diarrhea.  No chest pain.  She has chronic shortness of breath, no worse than typical with a history of COPD.  Pain is currently 8 out of 10.  Reports that over the phone her primary doctor thought she could have a urinary tract infection and placed on doxycycline 2 days ago.  Symptoms have persisted.  Patient is insistent blood was from vaginal rather than urinary.  No flank pain.   Past Medical History:  Diagnosis Date  . Adrenal insufficiency ([place])   . Anxiety   . CAD (coronary artery disease)   . Cancer ([place])    skin  . Chronic diastolic CHF (congestive heart failure) ([place])   . Chronic pain   . COPD (chronic obstructive pulmonary disease) ([place])   . Cushing disease ([place])   . Dementia   . GERD (gastroesophageal reflux disease)   . HLD (hyperlipidemia)     Patient Active Problem List   Diagnosis Date Noted  . Chest pain 06/17/2015  . Elevated troponin 06/17/2015  . Fracture, ribs 06/17/2015  . GERD (gastroesophageal reflux disease) 06/17/2015  . COPD (chronic obstructive pulmonary disease) ([place]) 06/17/2015  . Chronic pain 06/17/2015  . Anxiety 06/17/2015  . Adrenal insufficiency ([place]) 06/17/2015    Past Surgical History:  Procedure Laterality Date  .  APPENDECTOMY    . NASAL SEPTUM SURGERY    . NASAL SINUS SURGERY    . UMBILICAL HERNIA REPAIR    . VAGINAL HYSTERECTOMY      Prior to Admission medications   Medication Sig Start Date End Date Taking? Authorizing Provider  albuterol (PROAIR HFA) 108 (90 BASE) MCG/ACT inhaler Inhale 2 puffs into the lungs every 6 (six) hours as needed. 06/04/15   [provider]  ALPRAZolam Duanne Moron) 1 MG tablet Take 1 tablet by mouth 4 (four) times daily.    [provider]  Calcium Carbonate-Vitamin D 600-400 MG-UNIT tablet Take 1 tablet by mouth daily. 05/22/15 05/21/16  [provider]  cyclobenzaprine (FLEXERIL) 10 MG tablet Take 1 tablet by mouth 3 (three) times daily as needed.    [provider]  fluticasone (FLONASE) 50 MCG/ACT nasal spray Place 2 sprays into the nose daily. 01/02/13   [provider]  Fluticasone-Salmeterol (ADVAIR DISKUS) 250-50 MCG/DOSE AEPB Inhale 1 puff into the lungs every 12 (twelve) hours. 06/04/15 06/03/16  [provider]  furosemide (LASIX) 80 MG tablet Take 1 tablet by mouth daily. 07/18/14 07/18/15  [provider]  gabapentin (NEURONTIN) 600 MG tablet Take 1 tablet by mouth 3 (three) times daily.    [provider]  HYDROmorphone (DILAUDID) 2 MG tablet Take 1 mg by mouth every 4 (four) hours as needed. 03/23/15   [provider]  hydrOXYzine (ATARAX/VISTARIL) 25 MG tablet Take 1 tablet by mouth 3 (three) times daily as needed. 01/02/13   [provider]  omeprazole (PRILOSEC) 40 MG capsule Take 1 capsule by mouth 2 (two) times daily. 02/19/15 02/19/16  [provider]  oxyCODONE-acetaminophen (PERCOCET) 10-325 MG tablet Take 1 tablet by mouth every 6 (six) hours as needed (pt has to take mallinckrodt brand).  10/05/12   [provider]  prednisoLONE 5 MG TABS tablet Take 20 mg by mouth. 4 tabs daily but may take 8 tablets daily for three days consecutive days and then back to four  daily and may try and taper down to 1 daily if able too.    [provider]  PREDNISONE PO Take by mouth.    [provider]  promethazine (PHENERGAN) 25 MG tablet Take 25 mg by mouth every 8 (eight) hours as needed for nausea or vomiting.    [provider]    Allergies  Allergen Reactions  . Levaquin [Levofloxacin In D5w] Anaphylaxis  . Amoxicillin Itching  . Codeine Nausea And Vomiting  . Cymbalta [Duloxetine Hcl] Swelling  . Gatifloxacin Hives and Swelling    Other Reaction: Other reaction: Swelling  . Guaifenesin & Derivatives Hives and Swelling  . Keflex [Cephalexin] Other (See Comments)    Unknown  . Klonopin [Clonazepam] Other (See Comments)    "Makes me have bad thoughts"  . Macrobid [employer] Macro] Other (See Comments)    "kidneys shuts down" Chills, Fever.  . Penicillins Hives  . Sulfa Antibiotics Hives  . Amitriptyline Rash    "It makes me want to fall."    Family History  Problem Relation Age of Onset  . Ovarian cancer Mother   . Breast cancer Mother   . Brain cancer Mother   . CAD Father   . Diabetes Father   . Hepatitis Father   . Liver disease Father   . Cancer Father   . Alcohol abuse Father   . Heart attack Brother   . Sleep apnea Brother   . Diabetes Brother   . ALS Paternal Grandmother   . Stroke Maternal Grandmother     Social History Social History   Tobacco Use  . Smoking status: Current Every Day Smoker    Packs/day: 0.50    Types: Cigarettes  . Smokeless tobacco: Never Used  Substance Use Topics  . Alcohol use: Yes    Alcohol/week: 0.0 oz    Comment: rarely  . Drug use: No    Review of Systems  Constitutional: Negative for fever. Eyes: Negative for visual changes. ENT: Negative for sore throat. Cardiovascular: Negative for chest pain. Respiratory: Negative for shortness of breath. Gastrointestinal: The for lower abdominal pain across the suprapubic area as per HPI. Genitourinary:  Negative for dysuria. Musculoskeletal: Negative for back pain. Skin: Negative for rash. Neurological: Negative for headache.  ____________________________________________   PHYSICAL EXAM:  VITAL SIGNS: ED Triage Vitals  Enc Vitals Group     BP 08/31/17 1348 (!) 150/76     Pulse Rate 08/31/17 1348 (!) 110     Resp 08/31/17 1348 18     Temp 08/31/17 1348 98.7 F (37.1 C)     Temp Source 08/31/17 1348 Oral     SpO2 08/31/17 1348 93 %     Weight 08/31/17 1349 173 lb (78.5 kg)     Height 08/31/17 1349 5\' 4"  (1.626 m)     Head Circumference --      Peak Flow --      Pain Score 08/31/17 1349 8     Pain Loc --  Pain Edu? --      Excl. in [place]? --      Constitutional: Alert and oriented. Well appearing and in no distress. HEENT   Head: Normocephalic and atraumatic.      Eyes: Conjunctivae are normal. Pupils equal and round.       Ears:         Nose: No congestion/rhinnorhea.   Mouth/Throat: Mucous membranes are moist.   Neck: No stridor. Cardiovascular/Chest: Normal rate, regular rhythm.  No murmurs, rubs, or gallops. Respiratory: Normal respiratory effort without tachypnea nor retractions. Breath sounds are clear and equal bilaterally. No wheezes/rales/rhonchi. Gastrointestinal: Soft. No distention, no guarding, no rebound.  Possible mild bloating, but soft throughout, lower abdominal tenderness moderate without focal McBurney's point tenderness. Genitourinary/rectal: No vaginal bleeding.  Mild bladder prolapse.  No evidence for dehiscence or abnormality visualized based on speculum exam. Musculoskeletal: Nontender with normal range of motion in all extremities. No joint effusions.  No lower extremity tenderness.  No edema. Neurologic:  Normal speech and language. No gross or focal neurologic deficits are appreciated. Skin:  Skin is warm, dry and intact. No rash noted. Psychiatric: Mood and affect are normal. Speech and behavior are normal. Patient exhibits  appropriate insight and judgment.   ____________________________________________  LABS (pertinent positives/negatives) I, Lisa Roca, MD the attending physician have reviewed the labs noted below.  Labs Reviewed  CBC - Abnormal; Notable for the following components:      Result Value   WBC 14.7 (*)    MCV 77.6 (*)    MCH 24.6 (*)    MCHC 31.7 (*)    RDW 17.2 (*)    Platelets 477 (*)    All other components within normal limits  BASIC METABOLIC PANEL - Abnormal; Notable for the following components:   Potassium 2.9 (*)    Chloride 91 (*)    Glucose, Bld 249 (*)    Creatinine, Ser 1.21 (*)    GFR calc non Af Amer 49 (*)    GFR calc Af Amer 57 (*)    Anion gap 20 (*)    All other components within normal limits  URINALYSIS, COMPLETE (UACMP) WITH MICROSCOPIC - Abnormal; Notable for the following components:   Color, Urine COLORLESS (*)    APPearance CLEAR (*)    Specific Gravity, Urine 1.002 (*)    Squamous Epithelial / LPF 0-5 (*)    All other components within normal limits  HEPATIC FUNCTION PANEL - Abnormal; Notable for the following components:   Total Protein 8.2 (*)    Bilirubin, Direct <0.1 (*)    All other components within normal limits  LACTIC ACID, PLASMA  LACTIC ACID, PLASMA    ____________________________________________  RADIOLOGY All Xrays were viewed by me.  Imaging interpreted by Radiologist, and I, Lisa Roca, MD the attending physician have reviewed the radiologist interpretation noted below.  CT abdomen pelvis with contrast: Pending __________________________________________  PROCEDURES  Procedure(s) performed: None  Critical Care performed: None   ____________________________________________  ED COURSE / ASSESSMENT AND PLAN  Pertinent labs & imaging results that were available during my care of the patient were reviewed by me and considered in my medical decision making (see chart for details).    Patient is overall  well-appearing but having fairly significant lower abdominal pain.  She does have an elevated white blood cell count.  I discussed with her obtaining CT scan for further investigation.  Speculum exam did not show dehiscence or cyst or abscess or drainage or  bleeding.  Patient care transferred to Dr. Jimmye Norman at shift change 5 PM.  Disposition per CT result which is pending.    DIFFERENTIAL DIAGNOSIS: Differential diagnosis includes, but is not limited to, ovarian cyst, ovarian torsion, acute appendicitis, diverticulitis, urinary tract infection/pyelonephritis, endometriosis, bowel obstruction, colitis, renal colic, gastroenteritis, hernia, fibroids, endometriosis, pregnancy related pain including ectopic pregnancy, etc.   CONSULTATIONS: None  Patient / Family / Caregiver informed of clinical course, medical decision-making process, and agree with plan.    ___________________________________________   FINAL CLINICAL IMPRESSION(S) / ED DIAGNOSES   Final diagnoses:  Hyperglycemia  Acute renal failure, unspecified acute renal failure type ([place])  Lower abdominal pain  Vaginal bleeding  Hypokalemia      ___________________________________________        Note: This dictation was prepared with Dragon dictation. Any transcriptional errors that result from this process are unintentional    Lisa Roca, MD 08/31/17 1657

## 2017-08-31 NOTE — ED Notes (Signed)
RN reviewed with pt allergies pt reports she is too tired to think and whatever we have listed is correct, RN made Dr. Jimmye Norman aware. Dr. Jimmye Norman prescribed new medications and encouraged pt to make an apt with her PCP tomorrow to follow up.

## 2017-08-31 NOTE — ED Provider Notes (Addendum)
Patient looks remarkably well despite some lab abnormalities.  Lactate was clearing and it is elevated of uncertain etiology.  I find no source to suggest bacteremia or sepsis.  She received 2 L of fluid, currently she is taking doxycycline.  I will place her on Flagyl to take with the doxycycline and have her follow-up with her doctor tomorrow.   Earleen Newport, MD 08/31/17 1901    Earleen Newport, MD 08/31/17 [phone]

## 2017-08-31 NOTE — ED Triage Notes (Signed)
Pt in via POV with complaints of vaginal bleeding x 4 days.  Also complains of pelvic pain radiating around to back.  Pt ambulatory to triage without difficulty, NAD noted at this time.

## 2017-08-31 NOTE — ED Notes (Signed)
Verbal orders to hold second bolus of IV fluids until repeat lactic

## 2017-08-31 NOTE — ED Notes (Signed)
Pt reporting vaginal bleeding described as "spotting" that occurred X 2 days ago. States PCP called in antibiotic and was treating her for UTI. Pt states that pain continues and radiates to bilateral lower back. Pt did have hysterectomy.

## 2017-08-31 NOTE — ED Notes (Signed)
Pt verbalizes understanding of discharge instructions.

## 2017-08-31 NOTE — ED Notes (Signed)
Pt reports she is concerned trying new antibiotics requested to ask MD to prescribed medications she has already tried, Dr. Jimmye Norman made aware.

## 2017-08-31 NOTE — ED Notes (Signed)
Pt reports she has dementia Lewi body dementia, RN tried to review allergies pt reports"let me just stop you there you can can read all the allergies I cannot tell you what ever is in the record is right"

## 2017-09-01 ENCOUNTER — Telehealth: Payer: Self-pay | Admitting: Emergency Medicine

## 2017-09-01 NOTE — Telephone Encounter (Signed)
Patient called asking about coming back for IV antibiotics.  I do not see that it was recommended during the visit.  I spoke to dr Reita Cliche.  Whe I called the patient back, she told me she is no better, has been unable to get her flagyl due to [facility].  She will get that tomorrow.  She cannot go to a different pharmacy.  She says she is not vomiting, but has had some diarrhea.   She did call her doctor today, but they are not going to see her this week.  She asked about returning here. I told her that if she was worse or felt it necessary, she could certainly return for reevaluation.  She said she may come in tomorrow.  She says her mind is better in the morning.  She asked about the metal in her stomach.  I told her that she needs to follow up with her doctor about that as well.

## 2017-09-07 ENCOUNTER — Encounter: Payer: Self-pay | Admitting: Emergency Medicine

## 2017-09-07 ENCOUNTER — Other Ambulatory Visit: Payer: Self-pay

## 2017-09-07 ENCOUNTER — Observation Stay
Admission: EM | Admit: 2017-09-07 | Discharge: 2017-09-09 | Disposition: A | Discharge status: Home / Self Care | Payer: Medicare Other | Attending: Internal Medicine | Admitting: Internal Medicine

## 2017-09-07 ENCOUNTER — Emergency Department: Payer: Medicare Other

## 2017-09-07 DIAGNOSIS — I5032 Chronic diastolic (congestive) heart failure: Secondary | ICD-10-CM | POA: Diagnosis not present

## 2017-09-07 DIAGNOSIS — G8929 Other chronic pain: Secondary | ICD-10-CM | POA: Insufficient documentation

## 2017-09-07 DIAGNOSIS — E2740 Unspecified adrenocortical insufficiency: Secondary | ICD-10-CM | POA: Insufficient documentation

## 2017-09-07 DIAGNOSIS — F419 Anxiety disorder, unspecified: Secondary | ICD-10-CM | POA: Diagnosis not present

## 2017-09-07 DIAGNOSIS — E785 Hyperlipidemia, unspecified: Secondary | ICD-10-CM | POA: Insufficient documentation

## 2017-09-07 DIAGNOSIS — R109 Unspecified abdominal pain: Secondary | ICD-10-CM | POA: Diagnosis present

## 2017-09-07 DIAGNOSIS — F17210 Nicotine dependence, cigarettes, uncomplicated: Secondary | ICD-10-CM | POA: Diagnosis not present

## 2017-09-07 DIAGNOSIS — Z880 Allergy status to penicillin: Secondary | ICD-10-CM | POA: Diagnosis not present

## 2017-09-07 DIAGNOSIS — N200 Calculus of kidney: Secondary | ICD-10-CM | POA: Diagnosis not present

## 2017-09-07 DIAGNOSIS — R1084 Generalized abdominal pain: Secondary | ICD-10-CM | POA: Diagnosis not present

## 2017-09-07 DIAGNOSIS — K529 Noninfective gastroenteritis and colitis, unspecified: Secondary | ICD-10-CM | POA: Insufficient documentation

## 2017-09-07 DIAGNOSIS — I2510 Atherosclerotic heart disease of native coronary artery without angina pectoris: Secondary | ICD-10-CM | POA: Diagnosis not present

## 2017-09-07 DIAGNOSIS — Z8249 Family history of ischemic heart disease and other diseases of the circulatory system: Secondary | ICD-10-CM | POA: Insufficient documentation

## 2017-09-07 DIAGNOSIS — Z90710 Acquired absence of both cervix and uterus: Secondary | ICD-10-CM | POA: Insufficient documentation

## 2017-09-07 DIAGNOSIS — R197 Diarrhea, unspecified: Secondary | ICD-10-CM | POA: Diagnosis not present

## 2017-09-07 DIAGNOSIS — Z7952 Long term (current) use of systemic steroids: Secondary | ICD-10-CM | POA: Diagnosis not present

## 2017-09-07 DIAGNOSIS — Z888 Allergy status to other drugs, medicaments and biological substances status: Secondary | ICD-10-CM | POA: Insufficient documentation

## 2017-09-07 DIAGNOSIS — Z885 Allergy status to narcotic agent status: Secondary | ICD-10-CM | POA: Diagnosis not present

## 2017-09-07 DIAGNOSIS — J449 Chronic obstructive pulmonary disease, unspecified: Secondary | ICD-10-CM | POA: Diagnosis not present

## 2017-09-07 DIAGNOSIS — Z882 Allergy status to sulfonamides status: Secondary | ICD-10-CM | POA: Insufficient documentation

## 2017-09-07 DIAGNOSIS — Z86010 Personal history of colonic polyps: Secondary | ICD-10-CM | POA: Insufficient documentation

## 2017-09-07 DIAGNOSIS — E249 Cushing's syndrome, unspecified: Secondary | ICD-10-CM | POA: Diagnosis not present

## 2017-09-07 DIAGNOSIS — E876 Hypokalemia: Secondary | ICD-10-CM | POA: Diagnosis not present

## 2017-09-07 DIAGNOSIS — K219 Gastro-esophageal reflux disease without esophagitis: Secondary | ICD-10-CM | POA: Diagnosis not present

## 2017-09-07 DIAGNOSIS — Z881 Allergy status to other antibiotic agents status: Secondary | ICD-10-CM | POA: Insufficient documentation

## 2017-09-07 DIAGNOSIS — F0390 Unspecified dementia without behavioral disturbance: Secondary | ICD-10-CM | POA: Diagnosis not present

## 2017-09-07 DIAGNOSIS — Z85828 Personal history of other malignant neoplasm of skin: Secondary | ICD-10-CM | POA: Insufficient documentation

## 2017-09-07 DIAGNOSIS — Z79899 Other long term (current) drug therapy: Secondary | ICD-10-CM | POA: Insufficient documentation

## 2017-09-07 LAB — COMPREHENSIVE METABOLIC PANEL
ALBUMIN: 4 g/dL (ref 3.5–5.0)
ALT: 26 U/L (ref 14–54)
ANION GAP: 16 — AB (ref 5–15)
AST: 43 U/L — ABNORMAL HIGH (ref 15–41)
Alkaline Phosphatase: 118 U/L (ref 38–126)
BILIRUBIN TOTAL: 0.7 mg/dL (ref 0.3–1.2)
BUN: 9 mg/dL (ref 6–20)
CO2: 26 mmol/L (ref 22–32)
Calcium: 9 mg/dL (ref 8.9–10.3)
Chloride: 98 mmol/L — ABNORMAL LOW (ref 101–111)
Creatinine, Ser: 1.12 mg/dL — ABNORMAL HIGH (ref 0.44–1.00)
GFR calc Af Amer: >60 mL/min (ref >60)
GFR calc non Af Amer: 54 mL/min — ABNORMAL LOW (ref >60)
GLUCOSE: 172 mg/dL — AB (ref 65–99)
POTASSIUM: 3.1 mmol/L — AB (ref 3.5–5.1)
Sodium: 140 mmol/L (ref 135–145)
TOTAL PROTEIN: 7.7 g/dL (ref 6.5–8.1)

## 2017-09-07 LAB — URINALYSIS, COMPLETE (UACMP) WITH MICROSCOPIC
BACTERIA UA: NONE SEEN
BILIRUBIN URINE: NEGATIVE
Glucose, UA: NEGATIVE mg/dL
HGB URINE DIPSTICK: NEGATIVE
Ketones, ur: NEGATIVE mg/dL
Leukocytes, UA: NEGATIVE
NITRITE: NEGATIVE
Protein, ur: NEGATIVE mg/dL
Specific Gravity, Urine: 1.004 — ABNORMAL LOW (ref 1.005–1.030)
WBC, UA: NONE SEEN WBC/hpf (ref 0–5)
pH: 6 (ref 5.0–8.0)

## 2017-09-07 LAB — GASTROINTESTINAL PANEL BY PCR, STOOL (REPLACES STOOL CULTURE)
ASTROVIRUS: NOT DETECTED
Adenovirus F40/41: NOT DETECTED
Campylobacter species: NOT DETECTED
Cryptosporidium: NOT DETECTED
Cyclospora cayetanensis: NOT DETECTED
ENTEROAGGREGATIVE E COLI (EAEC): NOT DETECTED
ENTEROPATHOGENIC E COLI (EPEC): NOT DETECTED
ENTEROTOXIGENIC E COLI (ETEC): NOT DETECTED
Entamoeba histolytica: NOT DETECTED
GIARDIA LAMBLIA: NOT DETECTED
NOROVIRUS GI/GII: NOT DETECTED
Plesimonas shigelloides: NOT DETECTED
ROTAVIRUS A: NOT DETECTED
SALMONELLA SPECIES: NOT DETECTED
SHIGELLA/ENTEROINVASIVE E COLI (EIEC): NOT DETECTED
Sapovirus (I, II, IV, and V): NOT DETECTED
Shiga like toxin producing E coli (STEC): NOT DETECTED
Vibrio cholerae: NOT DETECTED
Vibrio species: NOT DETECTED
Yersinia enterocolitica: NOT DETECTED

## 2017-09-07 LAB — LACTIC ACID, PLASMA
LACTIC ACID, VENOUS: 3.3 mmol/L — AB (ref 0.5–1.9)
Lactic Acid, Venous: 3.4 mmol/L (ref 0.5–1.9)
Lactic Acid, Venous: 3 mmol/L (ref 0.5–1.9)
Lactic Acid, Venous: 1.9 mmol/L (ref 0.5–1.9)

## 2017-09-07 LAB — CBC
HEMATOCRIT: 37.8 % (ref 35.0–47.0)
HEMOGLOBIN: 12.1 g/dL (ref 12.0–16.0)
MCH: 24.9 pg — ABNORMAL LOW (ref 26.0–34.0)
MCHC: 31.9 g/dL — AB (ref 32.0–36.0)
MCV: 78.2 fL — ABNORMAL LOW (ref 80.0–100.0)
Platelets: 397 10*3/uL (ref 150–440)
RBC: 4.84 MIL/uL (ref 3.80–5.20)
RDW: 18 % — ABNORMAL HIGH (ref 11.5–14.5)
WBC: 15.2 10*3/uL — ABNORMAL HIGH (ref 3.6–11.0)

## 2017-09-07 LAB — LIPASE, BLOOD: Lipase: 22 U/L (ref 11–51)

## 2017-09-07 LAB — MAGNESIUM: MAGNESIUM: 1.5 mg/dL — AB (ref 1.7–2.4)

## 2017-09-07 LAB — C DIFFICILE QUICK SCREEN W PCR REFLEX
C Diff antigen: NEGATIVE
C Diff interpretation: NOT DETECTED
C Diff toxin: NEGATIVE

## 2017-09-07 MED ORDER — ACETAMINOPHEN 325 MG PO TABS: 650.0000 mg | ORAL_TABLET | Freq: Four times a day (QID) | ORAL | Status: DC | PRN

## 2017-09-07 MED ORDER — FUROSEMIDE 40 MG PO TABS
80.0000 mg | ORAL_TABLET | Freq: Every day | ORAL | Status: DC
  Administered 2017-09-07 – 2017-09-09 (×3): 80 mg via ORAL
  Filled 2017-09-07 (×3): qty 2

## 2017-09-07 MED ORDER — CYCLOBENZAPRINE HCL 10 MG PO TABS
10.0000 mg | ORAL_TABLET | Freq: Three times a day (TID) | ORAL | Status: DC | PRN
  Administered 2017-09-07 – 2017-09-08 (×3): 10 mg via ORAL
  Filled 2017-09-07 (×4): qty 1

## 2017-09-07 MED ORDER — GABAPENTIN 600 MG PO TABS
600.0000 mg | ORAL_TABLET | Freq: Three times a day (TID) | ORAL | Status: DC
  Administered 2017-09-07 – 2017-09-09 (×6): 600 mg via ORAL
  Filled 2017-09-07 (×6): qty 1

## 2017-09-07 MED ORDER — ALPRAZOLAM 0.5 MG PO TABS: 1.0000 mg | ORAL_TABLET | Freq: Four times a day (QID) | ORAL | Status: DC

## 2017-09-07 MED ORDER — ALPRAZOLAM 1 MG PO TABS
1.0000 mg | ORAL_TABLET | Freq: Four times a day (QID) | ORAL | Status: DC | PRN
  Administered 2017-09-07 – 2017-09-09 (×4): 1 mg via ORAL
  Filled 2017-09-07 (×6): qty 1

## 2017-09-07 MED ORDER — MAGNESIUM SULFATE 2 GM/50ML IV SOLN
2.0000 g | Freq: Once | INTRAVENOUS | Status: AC
  Administered 2017-09-07: 2 g via INTRAVENOUS
  Filled 2017-09-07: qty 50

## 2017-09-07 MED ORDER — METRONIDAZOLE IN NACL 5-0.79 MG/ML-% IV SOLN
500.0000 mg | Freq: Three times a day (TID) | INTRAVENOUS | Status: DC
  Administered 2017-09-07 – 2017-09-08 (×3): 500 mg via INTRAVENOUS
  Filled 2017-09-07 (×5): qty 100

## 2017-09-07 MED ORDER — HYDROXYZINE HCL 25 MG PO TABS
25.0000 mg | ORAL_TABLET | Freq: Three times a day (TID) | ORAL | Status: DC | PRN
  Administered 2017-09-07 – 2017-09-08 (×3): 25 mg via ORAL
  Filled 2017-09-07 (×4): qty 1

## 2017-09-07 MED ORDER — ENOXAPARIN SODIUM 40 MG/0.4ML ~~LOC~~ SOLN
40.0000 mg | SUBCUTANEOUS | Status: DC
  Administered 2017-09-07 – 2017-09-08 (×2): 40 mg via SUBCUTANEOUS
  Filled 2017-09-07 (×2): qty 0.4

## 2017-09-07 MED ORDER — ONDANSETRON HCL 4 MG PO TABS
4.0000 mg | ORAL_TABLET | Freq: Four times a day (QID) | ORAL | Status: DC | PRN
  Administered 2017-09-09: 4 mg via ORAL
  Filled 2017-09-07: qty 1

## 2017-09-07 MED ORDER — PREDNISONE 10 MG PO TABS
15.0000 mg | ORAL_TABLET | Freq: Every day | ORAL | Status: DC
  Administered 2017-09-08: 15 mg via ORAL
  Filled 2017-09-07: qty 1.5

## 2017-09-07 MED ORDER — MORPHINE SULFATE (PF) 4 MG/ML IV SOLN
4.0000 mg | Freq: Once | INTRAVENOUS | Status: AC
  Administered 2017-09-07: 4 mg via INTRAVENOUS
  Filled 2017-09-07: qty 1

## 2017-09-07 MED ORDER — HYDROCODONE-ACETAMINOPHEN 5-325 MG PO TABS
1.0000 | ORAL_TABLET | ORAL | Status: DC | PRN
  Administered 2017-09-07: 2 via ORAL
  Administered 2017-09-07: 1 via ORAL
  Administered 2017-09-08 – 2017-09-09 (×5): 2 via ORAL
  Filled 2017-09-07: qty 1
  Filled 2017-09-07 (×6): qty 2

## 2017-09-07 MED ORDER — NICOTINE 14 MG/24HR TD PT24
14.0000 mg | MEDICATED_PATCH | Freq: Every day | TRANSDERMAL | Status: DC
  Administered 2017-09-07 – 2017-09-09 (×3): 14 mg via TRANSDERMAL
  Filled 2017-09-07 (×3): qty 1

## 2017-09-07 MED ORDER — PANTOPRAZOLE SODIUM 40 MG PO TBEC
40.0000 mg | DELAYED_RELEASE_TABLET | Freq: Every day | ORAL | Status: DC
  Administered 2017-09-07 – 2017-09-09 (×3): 40 mg via ORAL
  Filled 2017-09-07 (×3): qty 1

## 2017-09-07 MED ORDER — ALBUTEROL SULFATE (2.5 MG/3ML) 0.083% IN NEBU: 2.5000 mg | INHALATION_SOLUTION | RESPIRATORY_TRACT | Status: DC | PRN

## 2017-09-07 MED ORDER — KETOROLAC TROMETHAMINE 30 MG/ML IJ SOLN: 30.0000 mg | Freq: Four times a day (QID) | INTRAMUSCULAR | Status: DC | PRN

## 2017-09-07 MED ORDER — ONDANSETRON HCL 4 MG/2ML IJ SOLN
4.0000 mg | Freq: Once | INTRAMUSCULAR | Status: AC
  Administered 2017-09-07: 4 mg via INTRAVENOUS
  Filled 2017-09-07: qty 2

## 2017-09-07 MED ORDER — METRONIDAZOLE 500 MG PO TABS
500.0000 mg | ORAL_TABLET | Freq: Once | ORAL | Status: AC
Start: 2017-09-07 — End: 2017-09-07
  Administered 2017-09-07: 500 mg via ORAL

## 2017-09-07 MED ORDER — ONDANSETRON HCL 4 MG/2ML IJ SOLN
4.0000 mg | Freq: Four times a day (QID) | INTRAMUSCULAR | Status: DC | PRN
  Administered 2017-09-07 – 2017-09-08 (×3): 4 mg via INTRAVENOUS
  Filled 2017-09-07 (×4): qty 2

## 2017-09-07 MED ORDER — FLUTICASONE PROPIONATE 50 MCG/ACT NA SUSP
2.0000 | Freq: Every day | NASAL | Status: DC
  Administered 2017-09-07 – 2017-09-09 (×3): 2 via NASAL
  Filled 2017-09-07: qty 16

## 2017-09-07 MED ORDER — POTASSIUM CHLORIDE IN NACL 40-0.9 MEQ/L-% IV SOLN
INTRAVENOUS | Status: DC
  Administered 2017-09-07 – 2017-09-08 (×2): 75 mL/h via INTRAVENOUS
  Filled 2017-09-07 (×4): qty 1000

## 2017-09-07 MED ORDER — ACETAMINOPHEN 650 MG RE SUPP: 650.0000 mg | Freq: Four times a day (QID) | RECTAL | Status: DC | PRN

## 2017-09-07 MED ORDER — SODIUM CHLORIDE 0.9 % IV SOLN
Freq: Once | INTRAVENOUS | Status: AC
  Administered 2017-09-07: 13:00:00 via INTRAVENOUS

## 2017-09-07 NOTE — ED Notes (Signed)
Caryl Pina RN aware of placement in Room 24.

## 2017-09-07 NOTE — ED Notes (Signed)
Patient transported to X-ray 

## 2017-09-07 NOTE — ED Notes (Addendum)
Report called to Terrie RN on 2C at this time. Pt will be transferred and care handed off to floor. VSS and pt is NAD.

## 2017-09-07 NOTE — Consult Note (Signed)
Kristina Lame, MD [facility]  [address] [place], [place] [address] Phone: [phone] Fax : [phone]  Consultation  Referring Provider:     Dr. Bridgett Larsson Primary Care Physician:  Nona Dell, Corene Cornea, MD Primary Gastroenterologist:  [facility] gastroenterology         Reason for Consultation:     Foreign body on KUB and CT scan  Date of Admission:  09/07/2017 Date of Consultation:  09/07/2017         HPI:   [name] is a 57 y.o. female who has chronic abdominal pain that she states has been present since she was a child.  The patient was seen at [facility] and had an EGD and colonoscopy with esophagitis found and colon polyps. A large polyp in the ascending colon was removed and metal clips were placed.  The patient states that she has chronic abdominal pain in the upper abdomen and lower abdomen.  She reports that she thinks she may have colitis.  The patient's CT scan did not show colitis nor did the colonoscopy done at the end of last month.  The patient also reports that she had chronic diarrhea for many years with typical bowel movements in the early morning multiple times and then will have some bowel movements after she eats.  She does not report this to be any worse than it has been in the past.  She does report that her abdominal pain has been worse over the last few weeks.  She also states that she has not heard back from [facility] about her biopsy results and is not sure what their plan was.  Past Medical History:  Diagnosis Date  . Adrenal insufficiency ([place])   . Anxiety   . CAD (coronary artery disease)   . Cancer ([place])    skin  . Chronic diastolic CHF (congestive heart failure) ([place])   . Chronic pain   . COPD (chronic obstructive pulmonary disease) ([place])   . Cushing disease ([place])   . Dementia   . GERD (gastroesophageal reflux disease)   . HLD (hyperlipidemia)     Past Surgical History:  Procedure Laterality Date  . APPENDECTOMY    . NASAL SEPTUM SURGERY    . NASAL SINUS  SURGERY    . UMBILICAL HERNIA REPAIR    . VAGINAL HYSTERECTOMY      Prior to Admission medications   Medication Sig Start Date End Date Taking? Authorizing Provider  albuterol (PROAIR HFA) 108 (90 BASE) MCG/ACT inhaler Inhale 2 puffs into the lungs every 6 (six) hours as needed. 06/04/15  Yes [provider]  ALPRAZolam Duanne Moron) 1 MG tablet Take 1 tablet by mouth 4 (four) times daily.   Yes [provider]  Calcium Carbonate-Vitamin D 600-400 MG-UNIT tablet Take 1 tablet by mouth daily. 05/22/15 09/07/17 Yes [provider]  cyclobenzaprine (FLEXERIL) 10 MG tablet Take 1 tablet by mouth 3 (three) times daily as needed.   Yes [provider]  donepezil (ARICEPT) 10 MG tablet Take 10 mg by mouth at bedtime.   Yes [provider]  fluticasone (FLONASE) 50 MCG/ACT nasal spray Place 2 sprays into the nose daily. 01/02/13  Yes [provider]  Fluticasone-Salmeterol (ADVAIR DISKUS) 250-50 MCG/DOSE AEPB Inhale 1 puff into the lungs every 12 (twelve) hours. 06/04/15 09/07/17 Yes [provider]  furosemide (LASIX) 80 MG tablet Take 2 tablets by mouth daily. May take additional 80mg  if needed   Yes [provider]  gabapentin (NEURONTIN)  600 MG tablet Take 1 tablet by mouth 3 (three) times daily.   Yes [provider]  hydrOXYzine (ATARAX/VISTARIL) 25 MG tablet Take 1 tablet by mouth 3 (three) times daily as needed. 01/02/13  Yes [provider]  magnesium oxide (MAG-OX) 400 MG tablet Take 400 mg by mouth daily.   Yes [provider]  omeprazole (PRILOSEC) 40 MG capsule Take 1 capsule by mouth 2 (two) times daily. 02/19/15 09/07/17 Yes [provider]  oxyCODONE-acetaminophen (PERCOCET) 10-325 MG tablet Take 1 tablet by mouth every 6 (six) hours as needed (pt has to take mallinckrodt brand).  10/05/12  Yes [provider]  potassium chloride SA (K-DUR,KLOR-CON) 20 MEQ tablet Take 20 mEq by mouth 2  (two) times daily.   Yes [provider]  predniSONE (DELTASONE) 1 MG tablet Take 2 mg by mouth daily with breakfast.   Yes [provider]  predniSONE (DELTASONE) 5 MG tablet Take 15 mg by mouth daily with breakfast.   Yes [provider]  ranitidine (ZANTAC) 150 MG tablet Take 150 mg by mouth 2 (two) times daily.   Yes [provider]  metroNIDAZOLE (FLAGYL) 500 MG tablet Take 1 tablet (500 mg total) by mouth 3 (three) times daily. Patient not taking: Reported on 09/07/2017 08/31/17   Earleen Newport, MD  promethazine (PHENERGAN) 25 MG tablet Take 25 mg by mouth every 8 (eight) hours as needed for nausea or vomiting.    [provider]  risedronate (ACTONEL) 35 MG tablet Take 35 mg by mouth every 7 (seven) days. with water on empty stomach, nothing by mouth or lie down for next 30 minutes.    [provider]    Family History  Problem Relation Age of Onset  . Ovarian cancer Mother   . Breast cancer Mother   . Brain cancer Mother   . CAD Father   . Diabetes Father   . Hepatitis Father   . Liver disease Father   . Cancer Father   . Alcohol abuse Father   . Heart attack Brother   . Sleep apnea Brother   . Diabetes Brother   . ALS Paternal Grandmother   . Stroke Maternal Grandmother      Social History   Tobacco Use  . Smoking status: Current Every Day Smoker    Packs/day: 0.50    Types: Cigarettes  . Smokeless tobacco: Never Used  Substance Use Topics  . Alcohol use: Yes    Alcohol/week: 0.0 oz    Comment: rarely  . Drug use: No    Allergies as of 09/07/2017 - Review Complete 09/07/2017  Allergen Reaction Noted  . Levaquin [levofloxacin in d5w] Anaphylaxis 03/20/2015  . Amoxicillin Itching 06/17/2015  . Codeine Nausea And Vomiting 03/20/2015  . Cymbalta [duloxetine hcl] Swelling 03/20/2015  . Gatifloxacin Hives and Swelling 06/17/2015  . Guaifenesin & derivatives Hives and Swelling 03/20/2015  . Keflex  [cephalexin] Other (See Comments) 03/20/2015  . Klonopin [clonazepam] Other (See Comments) 03/20/2015  . Macrobid [nitrofurantoin monohyd macro] Other (See Comments) 03/20/2015  . Penicillins Hives 03/20/2015  . Sulfa antibiotics Hives 03/20/2015  . Amitriptyline Rash 06/17/2015    Review of Systems:    All systems reviewed and negative except where noted in HPI.   Physical Exam:  Vital signs in last 24 hours: Temp:  [98.5 F (36.9 C)] 98.5 F (36.9 C) (02/19 1008) Pulse Rate:  [102-114] 102 (02/19 1440) Resp:  [12-16] 12 (02/19 1440) BP: (132-149)/(81-112) 138/112 (02/19 1440)  SpO2:  [94 %-98 %] 98 % (02/19 1440) Weight:  [170 lb (77.1 kg)] 170 lb (77.1 kg) (02/19 1010) Last BM Date: 09/07/17 General:   Pleasant, cooperative in NAD Head:  Normocephalic and atraumatic. Eyes:   No icterus.   Conjunctiva pink. PERRLA. Ears:  Normal auditory acuity. Neck:  Supple; no masses or thyroidomegaly Lungs: Respirations even and unlabored. Lungs clear to auscultation bilaterally.   No wheezes, crackles, or rhonchi.  Heart:  Regular rate and rhythm;  Without murmur, clicks, rubs or gallops Abdomen:  Soft, nondistended, Mild tenderness in the epigastric and lower abdominal areas. Normal bowel sounds. No appreciable masses or hepatomegaly.  No rebound or guarding.  Rectal:  Not performed. Msk:  Symmetrical without gross deformities.    Extremities:  Without edema, cyanosis or clubbing. Neurologic:  Alert and oriented x3;  grossly normal neurologically. Skin:  Intact without significant lesions or rashes. Cervical Nodes:  No significant cervical adenopathy. Psych:  Alert and cooperative. Normal affect.  LAB RESULTS: Recent Labs    09/07/17 1021  WBC 15.2*  HGB 12.1  HCT 37.8  PLT 397   BMET Recent Labs    09/07/17 1021  NA 140  K 3.1*  CL 98*  CO2 26  GLUCOSE 172*  BUN 9  CREATININE 1.12*  CALCIUM 9.0   LFT Recent Labs    09/07/17 1021  PROT 7.7  ALBUMIN 4.0  AST 43*   ALT 26  ALKPHOS 118  BILITOT 0.7   PT/INR No results for input(s): LABPROT, INR in the last 72 hours.  STUDIES: Dg Abd 2 Views  Result Date: 09/07/2017 CLINICAL DATA:  Diarrhea, generalized abdominal pain. EXAM: ABDOMEN - 2 VIEW COMPARISON:  CT scan of August 31, 2017. FINDINGS: The bowel gas pattern is normal. There is no evidence of free air. Linear metallic density is seen in right side of abdomen corresponding to foreign body seen on prior CT scan within the ascending colon. No abnormal calcifications are noted. IMPRESSION: No evidence of bowel obstruction or ileus. Linear metallic density is seen in right side of abdomen corresponding to foreign body seen on prior CT scan within the ascending colon. Electronically Signed   By: Marijo Conception, M.D.   On: 09/07/2017 11:33      Impression / Plan:   KAMAYAH PILLAY is a 57 y.o. y/o female with a foreign object seen on the CT scan which corresponds to her colonoscopy done less than a month ago with metallic clips placed in the ascending colon. The patient has started a workup at [facility] for her abdominal pain.  The patient's abdominal pain and diarrhea are likely caused by irritable bowel syndrome.  The patient has been encouraged to follow-up with [facility] where she had both her EGD and colonoscopy.  She does not need a colonoscopy to look at the descending colon since she had a colonoscopy a little over 3 weeks ago with the metallic clips placed at that time. I will sign off.  Please call if any further GI concerns or questions.  We would like to thank you for the opportunity to participate in the care of [name].    Thank you for involving me in the care of this patient.      LOS: 0 days   Kristina Lame, MD  09/07/2017, 7:36 PM   Note: This dictation was prepared with Dragon dictation along with smaller phrase technology. Any transcriptional errors that result from this process are  unintentional.

## 2017-09-07 NOTE — ED Notes (Addendum)
Date and time results received: 09/07/17 1104   Test: Lactic Critical Value: 3.3  Name of Provider Notified: Jimmye Norman

## 2017-09-07 NOTE — H&P (Addendum)
[facility] NAME: Nikaya Nasby    MR#:  [id number]  DATE OF BIRTH:  [birth year]  DATE OF ADMISSION:  09/07/2017  PRIMARY CARE PHYSICIAN: Townsend Roger, MD   REQUESTING/REFERRING PHYSICIAN: Dr. Jimmye Norman.  CHIEF COMPLAINT:   Chief Complaint  Patient presents with  . Abdominal Pain   Abdominal pain, nausea and diarrhea for almost 1 week. HISTORY OF PRESENT ILLNESS:  [name]  is a 57 y.o. female with a known history of CAD, adrenal insufficiency, Cushing's disease, hyperlipidemia, GERD, dementia, cancer, CHF, chronic pain and COPD.  The patient presents the ED with above chief complaints.  She has had abdominal pain which is in epigastric area and lower part of the abdomen, constant, 10 out of 10 without radiation, associated with nausea and diarrhea.  She also complains of fever and chills.  She was given Flagyl and discharged to home a few days ago but she did not have the Flagyl filled.  She came back for the similar symptoms.  Dr. Jimmye Norman suspected C. difficile colitis and requests for observation.  PAST MEDICAL HISTORY:   Past Medical History:  Diagnosis Date  . Adrenal insufficiency ([place])   . Anxiety   . CAD (coronary artery disease)   . Cancer ([place])    skin  . Chronic diastolic CHF (congestive heart failure) ([place])   . Chronic pain   . COPD (chronic obstructive pulmonary disease) ([place])   . Cushing disease ([place])   . Dementia   . GERD (gastroesophageal reflux disease)   . HLD (hyperlipidemia)     PAST SURGICAL HISTORY:   Past Surgical History:  Procedure Laterality Date  . APPENDECTOMY    . NASAL SEPTUM SURGERY    . NASAL SINUS SURGERY    . UMBILICAL HERNIA REPAIR    . VAGINAL HYSTERECTOMY      SOCIAL HISTORY:   Social History   Tobacco Use  . Smoking status: Current Every Day Smoker    Packs/day: 0.50    Types: Cigarettes  . Smokeless tobacco: Never Used  Substance Use Topics  . Alcohol use: Yes    Alcohol/week: 0.0 oz    Comment: rarely    FAMILY HISTORY:   Family History  Problem Relation Age of Onset  . Ovarian cancer Mother   . Breast cancer Mother   . Brain cancer Mother   . CAD Father   . Diabetes Father   . Hepatitis Father   . Liver disease Father   . Cancer Father   . Alcohol abuse Father   . Heart attack Brother   . Sleep apnea Brother   . Diabetes Brother   . ALS Paternal Grandmother   . Stroke Maternal Grandmother     DRUG ALLERGIES:   Allergies  Allergen Reactions  . Levaquin [Levofloxacin In D5w] Anaphylaxis  . Amoxicillin Itching  . Codeine Nausea And Vomiting  . Cymbalta [Duloxetine Hcl] Swelling  . Gatifloxacin Hives and Swelling    Other Reaction: Other reaction: Swelling  . Guaifenesin & Derivatives Hives and Swelling  . Keflex [Cephalexin] Other (See Comments)    Unknown  . Klonopin [Clonazepam] Other (See Comments)    "Makes me have bad thoughts"  . Macrobid [employer] Macro] Other (See Comments)    "kidneys shuts down" Chills, Fever.  . Penicillins Hives    Has patient had a PCN reaction causing immediate rash, facial/tongue/throat swelling, SOB or lightheadedness with hypotension: Yes Has patient had a  PCN reaction causing severe rash involving mucus membranes or skin necrosis: No Has patient had a PCN reaction that required hospitalization: No Has patient had a PCN reaction occurring within the last 10 years: No If all of the above answers are "NO", then may proceed with Cephalosporin use.  . Sulfa Antibiotics Hives  . Amitriptyline Rash    "It makes me want to fall."    REVIEW OF SYSTEMS:   Review of Systems  Constitutional: Positive for chills, fever and malaise/fatigue.  HENT: Negative for sore throat.   Eyes: Negative for blurred vision and double vision.  Respiratory: Negative for cough, hemoptysis, shortness of breath, wheezing and stridor.   Cardiovascular: Negative for chest pain, palpitations, orthopnea  and leg swelling.  Gastrointestinal: Positive for abdominal pain, diarrhea and nausea. Negative for blood in stool, melena and vomiting.  Genitourinary: Negative for dysuria, flank pain and hematuria.  Musculoskeletal: Negative for back pain and joint pain.  Skin: Negative for rash.  Neurological: Negative for dizziness, sensory change, focal weakness, seizures, loss of consciousness, weakness and headaches.  Endo/Heme/Allergies: Negative for polydipsia.  Psychiatric/Behavioral: Negative for depression. The patient is not nervous/anxious.     MEDICATIONS AT HOME:   Prior to Admission medications   Medication Sig Start Date End Date Taking? Authorizing Provider  albuterol (PROAIR HFA) 108 (90 BASE) MCG/ACT inhaler Inhale 2 puffs into the lungs every 6 (six) hours as needed. 06/04/15  Yes [provider]  ALPRAZolam Duanne Moron) 1 MG tablet Take 1 tablet by mouth 4 (four) times daily.   Yes [provider]  Calcium Carbonate-Vitamin D 600-400 MG-UNIT tablet Take 1 tablet by mouth daily. 05/22/15 09/07/17 Yes [provider]  cyclobenzaprine (FLEXERIL) 10 MG tablet Take 1 tablet by mouth 3 (three) times daily as needed.   Yes [provider]  furosemide (LASIX) 80 MG tablet Take 1 tablet by mouth daily. 07/18/14 09/07/17 Yes [provider]  gabapentin (NEURONTIN) 600 MG tablet Take 1 tablet by mouth 3 (three) times daily.   Yes [provider]  hydrOXYzine (ATARAX/VISTARIL) 25 MG tablet Take 1 tablet by mouth 3 (three) times daily as needed. 01/02/13  Yes [provider]  omeprazole (PRILOSEC) 40 MG capsule Take 1 capsule by mouth 2 (two) times daily. 02/19/15 09/07/17 Yes [provider]  oxyCODONE-acetaminophen (PERCOCET) 10-325 MG tablet Take 1 tablet by mouth every 6 (six) hours as needed (pt has to take [place] brand).  10/05/12  Yes [provider]  predniSONE (DELTASONE) 1 MG tablet Take 2 mg by mouth daily with  breakfast.   Yes [provider]  predniSONE (DELTASONE) 5 MG tablet Take 15 mg by mouth daily with breakfast.   Yes [provider]  fluticasone (FLONASE) 50 MCG/ACT nasal spray Place 2 sprays into the nose daily. 01/02/13   [provider]  Fluticasone-Salmeterol (ADVAIR DISKUS) 250-50 MCG/DOSE AEPB Inhale 1 puff into the lungs every 12 (twelve) hours. 06/04/15 06/03/16  [provider]  metroNIDAZOLE (FLAGYL) 500 MG tablet Take 1 tablet (500 mg total) by mouth 3 (three) times daily. Patient not taking: Reported on 09/07/2017 08/31/17   Earleen Newport, MD  promethazine (PHENERGAN) 25 MG tablet Take 25 mg by mouth every 8 (eight) hours as needed for nausea or vomiting.    [provider]      VITAL SIGNS:  Blood pressure 139/81, pulse (!) 108, temperature 98.5 F (36.9 C), temperature source Oral, resp. rate 16, height 5\' 4"  (1.626 m), weight 170 lb (  77.1 kg), SpO2 95 %.  PHYSICAL EXAMINATION:  Physical Exam  GENERAL:  57 y.o.-year-old patient lying in the bed with no acute distress.  EYES: Pupils equal, round, reactive to light and accommodation. No scleral icterus. Extraocular muscles intact.  HEENT: Head atraumatic, normocephalic. Oropharynx and nasopharynx clear.  NECK:  Supple, no jugular venous distention. No thyroid enlargement, no tenderness.  LUNGS: Normal breath sounds bilaterally, no wheezing, rales,rhonchi or crepitation. No use of accessory muscles of respiration.  CARDIOVASCULAR: S1, S2 normal. No murmurs, rubs, or gallops.  ABDOMEN: Soft, tenderness in epigastric area and in the lower part, nondistended. Bowel sounds present. No organomegaly or mass.  EXTREMITIES: No pedal edema, cyanosis, or clubbing.  NEUROLOGIC: Cranial nerves II through XII are intact. Muscle strength 5/5 in all extremities. Sensation intact. Gait not checked.  PSYCHIATRIC: The patient is alert and oriented x 3.  SKIN: No obvious rash, lesion, or ulcer.    LABORATORY PANEL:   CBC Recent Labs  Lab 09/07/17 1021  WBC 15.2*  HGB 12.1  HCT 37.8  PLT 397   ------------------------------------------------------------------------------------------------------------------  Chemistries  Recent Labs  Lab 09/07/17 1021  NA 140  K 3.1*  CL 98*  CO2 26  GLUCOSE 172*  BUN 9  CREATININE 1.12*  CALCIUM 9.0  AST 43*  ALT 26  ALKPHOS 118  BILITOT 0.7   ------------------------------------------------------------------------------------------------------------------  Cardiac Enzymes No results for input(s): TROPONINI in the last 168 hours. ------------------------------------------------------------------------------------------------------------------  RADIOLOGY:  Dg Abd 2 Views  Result Date: 09/07/2017 CLINICAL DATA:  Diarrhea, generalized abdominal pain. EXAM: ABDOMEN - 2 VIEW COMPARISON:  CT scan of August 31, 2017. FINDINGS: The bowel gas pattern is normal. There is no evidence of free air. Linear metallic density is seen in right side of abdomen corresponding to foreign body seen on prior CT scan within the ascending colon. No abnormal calcifications are noted. IMPRESSION: No evidence of bowel obstruction or ileus. Linear metallic density is seen in right side of abdomen corresponding to foreign body seen on prior CT scan within the ascending colon. Electronically Signed   By: Marijo Conception, M.D.   On: 09/07/2017 11:33      IMPRESSION AND PLAN:   Abdominal pain, nausea and diarrhea, possible acute gastroenteritis.  Rule out C. difficile colitis. The patient will be placed for observation. Full liquid diet with IV fluids support, continue Flagyl IV and follow-up stool GI panel and C. difficile test.  Rod-like 2 cm x 4 mm thick metallic or hyperdense foreign body appearing object in the ascending colon  GI consult.  Hypokalemia.  Give potassium supplement and follow-up level.  Chronic diastolic CHF.  Stable. Adrenal  insufficiency, continue prednisone. COPD.  Stable.  Nebulizer as needed. Tobacco abuse.  Smoking cessation was counseled for 3-4 minutes, nicotine patch.  All the records are reviewed and case discussed with ED provider. Management plans discussed with the patient, family and they are in agreement.  CODE STATUS: Full code  TOTAL TIME TAKING CARE OF THIS PATIENT: 55 minutes.    Demetrios Loll M.D on 09/07/2017 at 1:11 PM  Between 7am to 6pm - Pager - [phone]  After 6pm go to [email or web address] - Proofreader  [facility]  Office  [phone]  CC: Primary care physician; Townsend Roger, MD   Note: This dictation was prepared with Dragon dictation along with smaller phrase technology. Any transcriptional errors that result from this process are unin

## 2017-09-07 NOTE — ED Notes (Signed)
Date and time results received: 09/07/17 1419   Test: lac Critical Value: 2.7  Name of Provider Notified: williams

## 2017-09-07 NOTE — ED Provider Notes (Signed)
[facility] Emergency Department Provider Note       Time seen: ----------------------------------------- 10:37 AM on 09/07/2017 -----------------------------------------   I have reviewed the triage vital signs and the nursing notes.  HISTORY   Chief Complaint Abdominal Pain    HPI [name] is a 57 y.o. female with a history of adrenal insufficiency, anxiety, skin cancer, chronic diastolic CHF, chronic pain, COPD, dementia, GERD and hyperlipidemia who presents to the ED for persistent fever and abdominal pain.  She has not had vomiting but has had diarrhea since her last visit.  She is also requesting evaluation for a laceration sustained on her left lower leg yesterday.  Past Medical History:  Diagnosis Date  . Adrenal insufficiency ([place])   . Anxiety   . CAD (coronary artery disease)   . Cancer ([place])    skin  . Chronic diastolic CHF (congestive heart failure) ([place])   . Chronic pain   . COPD (chronic obstructive pulmonary disease) ([place])   . Cushing disease ([place])   . Dementia   . GERD (gastroesophageal reflux disease)   . HLD (hyperlipidemia)     Patient Active Problem List   Diagnosis Date Noted  . Chest pain 06/17/2015  . Elevated troponin 06/17/2015  . Fracture, ribs 06/17/2015  . GERD (gastroesophageal reflux disease) 06/17/2015  . COPD (chronic obstructive pulmonary disease) ([place]) 06/17/2015  . Chronic pain 06/17/2015  . Anxiety 06/17/2015  . Adrenal insufficiency ([place]) 06/17/2015    Past Surgical History:  Procedure Laterality Date  . APPENDECTOMY    . NASAL SEPTUM SURGERY    . NASAL SINUS SURGERY    . UMBILICAL HERNIA REPAIR    . VAGINAL HYSTERECTOMY      Allergies Levaquin [levofloxacin in d5w]; Amoxicillin; Codeine; Cymbalta [duloxetine hcl]; Gatifloxacin; Guaifenesin & derivatives; Keflex [cephalexin]; Klonopin [clonazepam]; Macrobid [nitrofurantoin monohyd macro]; Penicillins; Sulfa antibiotics; and  Amitriptyline  Social History Social History   Tobacco Use  . Smoking status: Current Every Day Smoker    Packs/day: 0.50    Types: Cigarettes  . Smokeless tobacco: Never Used  Substance Use Topics  . Alcohol use: Yes    Alcohol/week: 0.0 oz    Comment: rarely  . Drug use: No    Review of Systems Constitutional: Negative for fever. Cardiovascular: Negative for chest pain. Respiratory: Negative for shortness of breath. Gastrointestinal: Positive for abdominal pain and diarrhea  Genitourinary: Negative for dysuria. Musculoskeletal: Negative for back pain. Skin: Positive for left leg laceration Neurological: Negative for headaches, focal weakness or numbness.  All systems negative/normal/unremarkable except as stated in the HPI  ____________________________________________   PHYSICAL EXAM:  VITAL SIGNS: ED Triage Vitals  Enc Vitals Group     BP 09/07/17 1008 132/83     Pulse Rate 09/07/17 1008 (!) 114     Resp 09/07/17 1008 16     Temp 09/07/17 1008 98.5 F (36.9 C)     Temp Source 09/07/17 1008 Oral     SpO2 09/07/17 1008 94 %     Weight 09/07/17 1010 170 lb (77.1 kg)     Height 09/07/17 1010 5\' 4"  (1.626 m)     Head Circumference --      [place] Flow --      Pain Score 09/07/17 1019 10     Pain Loc --      Pain Edu? --      Excl. in [place]? --     Constitutional: Alert and oriented. Well appearing and in no distress.  Eyes: Conjunctivae are normal. Normal extraocular movements. ENT   Head: Normocephalic and atraumatic.   Nose: No congestion/rhinnorhea.   Mouth/Throat: Mucous membranes are moist.   Neck: No stridor. Cardiovascular: Normal rate, regular rhythm. No murmurs, rubs, or gallops. Respiratory: Normal respiratory effort without tachypnea nor retractions. Breath sounds are clear and equal bilaterally. No wheezes/rales/rhonchi. Gastrointestinal: Nonfocal tenderness, normal bowel sounds. Musculoskeletal: Nontender with normal range of motion in  extremities. No lower extremity tenderness nor edema. Neurologic:  Normal speech and language. No gross focal neurologic deficits are appreciated.  Skin:  Skin is warm, dry and intact. No rash noted. Psychiatric: Mood and affect are normal. Speech and behavior are normal.  ____________________________________________  ED COURSE:  As part of my medical decision making, I reviewed the following data within the [place] History obtained from family if available, nursing notes, old chart and ekg, as well as notes from prior ED visits. Patient presented for persistent abdominal pain and diarrhea after recently being seen for similar., we will assess with labs and imaging as indicated at this time. Clinical Course as of Sep 08 1219  Tue Sep 07, 2017  1105 Patient appears to have chronic leukocytosis and chronic hypochloremia  [JW]  1113 Lactic Acid, Venous: (!!) 3.3 [JW]  1113 WBC: (!) 15.2 [JW]  1113 Potassium: (!) 3.1 [JW]    Clinical Course User Index [JW] Earleen Newport, MD   Procedures ____________________________________________   LABS (pertinent positives/negatives)  Labs Reviewed  COMPREHENSIVE METABOLIC PANEL - Abnormal; Notable for the following components:      Result Value   Potassium 3.1 (*)    Chloride 98 (*)    Glucose, Bld 172 (*)    Creatinine, Ser 1.12 (*)    AST 43 (*)    GFR calc non Af Amer 54 (*)    Anion gap 16 (*)    All other components within normal limits  CBC - Abnormal; Notable for the following components:   WBC 15.2 (*)    MCV 78.2 (*)    MCH 24.9 (*)    MCHC 31.9 (*)    RDW 18.0 (*)    All other components within normal limits  URINALYSIS, COMPLETE (UACMP) WITH MICROSCOPIC - Abnormal; Notable for the following components:   Color, Urine COLORLESS (*)    APPearance CLEAR (*)    Specific Gravity, Urine 1.004 (*)    Squamous Epithelial / LPF 0-5 (*)    All other components within normal limits  LACTIC ACID, PLASMA -  Abnormal; Notable for the following components:   Lactic Acid, Venous 3.3 (*)    All other components within normal limits  C DIFFICILE QUICK SCREEN W PCR REFLEX  GASTROINTESTINAL PANEL BY PCR, STOOL (REPLACES STOOL CULTURE)  CULTURE, BLOOD (ROUTINE X 2)  CULTURE, BLOOD (ROUTINE X 2)  LIPASE, BLOOD  LACTIC ACID, PLASMA    RADIOLOGY Images were viewed by me  Abdomen 2 view IMPRESSION: No evidence of bowel obstruction or ileus. Linear metallic density is seen in right side of abdomen corresponding to foreign body seen on prior CT scan within the ascending colon. ____________________________________________  DIFFERENTIAL DIAGNOSIS   Dehydration, electrolyte abnormality, C. difficile colitis, gastroenteritis, sepsis  FINAL ASSESSMENT AND PLAN  Abdominal pain, diarrhea   Plan: Patient had presented predominantly for abdominal pain and diarrhea. Patient's labs do reveal mild hypokalemia as well as leukocytosis and elevated lactic acid level.  She was recently seen for similar without any specific diagnosis being given. Patient's  imaging both previously and today only reveals a metallic foreign body in the ascending colon with no other abnormality.  We will place her on Flagyl and give IV fluids.  I am not convinced that she is septic at this point.  We will obtain cultures and send in for evaluation.   Laurence Aly, MD   Note: This note was generated in part or whole with voice recognition software. Voice recognition is usually quite accurate but there are transcription errors that can and very often do occur. I apologize for any typographical errors that were not detected and corrected.     Earleen Newport, MD 09/07/17 [phone]

## 2017-09-07 NOTE — ED Triage Notes (Signed)
Here last week with elevated lactic and possible metal in abdomen per CT scan. Has not been able to get flagyl prescribed because out of it at pharmacy.  Reports doctor called doxy in for UTI but she did not have one here.  Has had fevers at home up to 101. Remains to have pain in abdomen.  Declined to be admitted last visit but now ready.  No vomiting has had diarrhea.

## 2017-09-07 NOTE — ED Notes (Signed)
Charge Nurse called for bed.

## 2017-09-07 NOTE — ED Notes (Signed)
Blood culutres x 2 and lac sent at this time.

## 2017-09-07 NOTE — ED Notes (Signed)
Pt IV site was painful per pt so this RN removed it. Floor RN (SWOT was made aware) that pt has no IV access at this time.

## 2017-09-08 LAB — CBC
HCT: 33.1 % — ABNORMAL LOW (ref 35.0–47.0)
Hemoglobin: 10.6 g/dL — ABNORMAL LOW (ref 12.0–16.0)
MCH: 25.2 pg — ABNORMAL LOW (ref 26.0–34.0)
MCHC: 32.1 g/dL (ref 32.0–36.0)
MCV: 78.5 fL — AB (ref 80.0–100.0)
PLATELETS: 328 10*3/uL (ref 150–440)
RBC: 4.22 MIL/uL (ref 3.80–5.20)
RDW: 18 % — ABNORMAL HIGH (ref 11.5–14.5)
WBC: 9.3 10*3/uL (ref 3.6–11.0)

## 2017-09-08 LAB — BASIC METABOLIC PANEL
Anion gap: 11 (ref 5–15)
BUN: 8 mg/dL (ref 6–20)
CHLORIDE: 99 mmol/L — AB (ref 101–111)
CO2: 30 mmol/L (ref 22–32)
CREATININE: 0.88 mg/dL (ref 0.44–1.00)
Calcium: 8.4 mg/dL — ABNORMAL LOW (ref 8.9–10.3)
GFR calc Af Amer: >60 mL/min (ref >60)
GFR calc non Af Amer: >60 mL/min (ref >60)
Glucose, Bld: 127 mg/dL — ABNORMAL HIGH (ref 65–99)
Potassium: 3.2 mmol/L — ABNORMAL LOW (ref 3.5–5.1)
SODIUM: 140 mmol/L (ref 135–145)

## 2017-09-08 LAB — MAGNESIUM: Magnesium: 2.1 mg/dL (ref 1.7–2.4)

## 2017-09-08 LAB — HIV ANTIBODY (ROUTINE TESTING W REFLEX): HIV SCREEN 4TH GENERATION: NONREACTIVE

## 2017-09-08 LAB — POTASSIUM: Potassium: 3.6 mmol/L (ref 3.5–5.1)

## 2017-09-08 MED ORDER — POTASSIUM CHLORIDE CRYS ER 20 MEQ PO TBCR
40.0000 meq | EXTENDED_RELEASE_TABLET | Freq: Once | ORAL | Status: AC
  Administered 2017-09-08: 40 meq via ORAL
  Filled 2017-09-08: qty 2

## 2017-09-08 MED ORDER — PREDNISONE 5 MG PO TABS
17.0000 mg | ORAL_TABLET | Freq: Every day | ORAL | Status: DC
  Administered 2017-09-09: 17 mg via ORAL
  Filled 2017-09-08: qty 2

## 2017-09-08 MED ORDER — LOPERAMIDE HCL 2 MG PO CAPS
2.0000 mg | ORAL_CAPSULE | ORAL | Status: DC | PRN
  Administered 2017-09-08 – 2017-09-09 (×2): 2 mg via ORAL
  Filled 2017-09-08 (×2): qty 1

## 2017-09-08 NOTE — Progress Notes (Signed)
RN educated pt about bed alarm. Pt continues to refuse bed alarm.

## 2017-09-08 NOTE — Progress Notes (Signed)
MEDICATION RELATED CONSULT NOTE - FOLLOW UP   Pharmacy Consult for Electrolyte Management Indication: Hypokalemia  Allergies  Allergen Reactions  . Levaquin [Levofloxacin In D5w] Anaphylaxis  . Amoxicillin Itching  . Codeine Nausea And Vomiting  . Cymbalta [Duloxetine Hcl] Swelling  . Gatifloxacin Hives and Swelling    Other Reaction: Other reaction: Swelling  . Guaifenesin & Derivatives Hives and Swelling  . Keflex [Cephalexin] Other (See Comments)    Unknown  . Klonopin [Clonazepam] Other (See Comments)    "Makes me have bad thoughts"  . Macrobid [employer] Macro] Other (See Comments)    "kidneys shuts down" Chills, Fever.  . Penicillins Hives    Has patient had a PCN reaction causing immediate rash, facial/tongue/throat swelling, SOB or lightheadedness with hypotension: Yes Has patient had a PCN reaction causing severe rash involving mucus membranes or skin necrosis: No Has patient had a PCN reaction that required hospitalization: No Has patient had a PCN reaction occurring within the last 10 years: No If all of the above answers are "NO", then may proceed with Cephalosporin use.  . Sulfa Antibiotics Hives  . Amitriptyline Rash    "It makes me want to fall."    Patient Measurements: Height: 5\' 4"  (162.6 cm) Weight: 170 lb (77.1 kg) IBW/kg (Calculated) : 54.7 Adjusted Body Weight:   Vital Signs: Temp: 98.3 F (36.8 C) (02/20 1459) Temp Source: Oral (02/20 1459) BP: 136/74 (02/20 1459) Pulse Rate: 80 (02/20 1459) Intake/Output from previous day: 02/19 0701 - 02/20 0700 In: 136.5 [I.V.:36.5; IV Piggyback:100] Out: 1700 [Urine:1700] Intake/Output from this shift: Total I/O In: 2419 [P.O.:960; I.V.:1369; IV Piggyback:90] Out: -   Labs: Recent Labs    09/07/17 1021 09/07/17 1500 09/08/17 0328  WBC 15.2*  --  9.3  HGB 12.1  --  10.6*  HCT 37.8  --  33.1*  PLT 397  --  328  CREATININE 1.12*  --  0.88  MG  --  1.5* 2.1  ALBUMIN 4.0  --   --    PROT 7.7  --   --   AST 43*  --   --   ALT 26  --   --   ALKPHOS 118  --   --   BILITOT 0.7  --   --    Estimated Creatinine Clearance: 72.6 mL/min (by C-G formula based on SCr of 0.88 mg/dL).   Microbiology: Recent Results (from the past 720 hour(s))  Blood culture (routine x 2)     Status: None (Preliminary result)   Collection Time: 09/07/17  1:13 PM  Result Value Ref Range Status   Specimen Description BLOOD RIGHT AC  Final   Special Requests   Final    BOTTLES DRAWN AEROBIC AND ANAEROBIC Blood Culture adequate volume   Culture   Final    NO GROWTH < 24 HOURS Performed at [facility], [address] [place], [place] [address]    Report Status PENDING  Incomplete  Blood culture (routine x 2)     Status: None (Preliminary result)   Collection Time: 09/07/17  1:19 PM  Result Value Ref Range Status   Specimen Description BLOOD LEFT AC  Final   Special Requests   Final    BOTTLES DRAWN AEROBIC AND ANAEROBIC Blood Culture results may not be optimal due to an excessive volume of blood received in culture bottles   Culture   Final    NO GROWTH < 24 HOURS Performed at [facility], [address] [place], [place] [address]    Report Status PENDING  Incomplete  C difficile quick scan w PCR reflex     Status: None   Collection Time: 09/07/17  6:58 PM  Result Value Ref Range Status   C Diff antigen NEGATIVE NEGATIVE Final   C Diff toxin NEGATIVE NEGATIVE Final   C Diff interpretation No C. difficile detected.  Final    Comment: Performed at [facility], [address] [place], [place] [address]  Gastrointestinal Panel by PCR , Stool     Status: None   Collection Time: 09/07/17  6:58 PM  Result Value Ref Range Status   Campylobacter species NOT DETECTED NOT DETECTED Final   Plesimonas shigelloides NOT DETECTED NOT DETECTED Final   Salmonella species NOT DETECTED NOT DETECTED Final   Yersinia enterocolitica NOT DETECTED NOT DETECTED Final    Vibrio species NOT DETECTED NOT DETECTED Final   Vibrio cholerae NOT DETECTED NOT DETECTED Final   Enteroaggregative E coli (EAEC) NOT DETECTED NOT DETECTED Final   Enteropathogenic E coli (EPEC) NOT DETECTED NOT DETECTED Final   Enterotoxigenic E coli (ETEC) NOT DETECTED NOT DETECTED Final   Shiga like toxin producing E coli (STEC) NOT DETECTED NOT DETECTED Final   Shigella/Enteroinvasive E coli (EIEC) NOT DETECTED NOT DETECTED Final   Cryptosporidium NOT DETECTED NOT DETECTED Final   Cyclospora cayetanensis NOT DETECTED NOT DETECTED Final   Entamoeba histolytica NOT DETECTED NOT DETECTED Final   Giardia lamblia NOT DETECTED NOT DETECTED Final   Adenovirus F40/41 NOT DETECTED NOT DETECTED Final   Astrovirus NOT DETECTED NOT DETECTED Final   Norovirus GI/GII NOT DETECTED NOT DETECTED Final   Rotavirus A NOT DETECTED NOT DETECTED Final   Sapovirus (I, II, IV, and V) NOT DETECTED NOT DETECTED Final    Comment: Performed at [facility], [address], [place],  [address]    Assessment: Patient is a 57yo female admitted for abdominal pain and chronic diarrhea. Pharmacy consulted for electrolyte management.  K=3.2  Goal of Therapy:  Maintain electrolytes within range  Plan:  Will order KCl 89mEq PO once and recheck K level at 18:00.  Paulina Fusi, PharmD, BCPS 09/08/2017 3:50 PM

## 2017-09-08 NOTE — Progress Notes (Signed)
MEDICATION RELATED CONSULT NOTE - FOLLOW UP   Pharmacy Consult for Electrolyte Management Indication: Hypokalemia  Allergies  Allergen Reactions  . Levaquin [Levofloxacin In D5w] Anaphylaxis  . Amoxicillin Itching  . Codeine Nausea And Vomiting  . Cymbalta [Duloxetine Hcl] Swelling  . Gatifloxacin Hives and Swelling    Other Reaction: Other reaction: Swelling  . Guaifenesin & Derivatives Hives and Swelling  . Keflex [Cephalexin] Other (See Comments)    Unknown  . Klonopin [Clonazepam] Other (See Comments)    "Makes me have bad thoughts"  . Macrobid [employer] Macro] Other (See Comments)    "kidneys shuts down" Chills, Fever.  . Penicillins Hives    Has patient had a PCN reaction causing immediate rash, facial/tongue/throat swelling, SOB or lightheadedness with hypotension: Yes Has patient had a PCN reaction causing severe rash involving mucus membranes or skin necrosis: No Has patient had a PCN reaction that required hospitalization: No Has patient had a PCN reaction occurring within the last 10 years: No If all of the above answers are "NO", then may proceed with Cephalosporin use.  . Sulfa Antibiotics Hives  . Amitriptyline Rash    "It makes me want to fall."    Patient Measurements: Height: 5\' 4"  (162.6 cm) Weight: 170 lb (77.1 kg) IBW/kg (Calculated) : 54.7 Adjusted Body Weight:   Vital Signs: Temp: 98.3 F (36.8 C) (02/20 1459) Temp Source: Oral (02/20 1459) BP: 136/74 (02/20 1459) Pulse Rate: 80 (02/20 1459) Intake/Output from previous day: 02/19 0701 - 02/20 0700 In: 136.5 [I.V.:36.5; IV Piggyback:100] Out: 1700 [Urine:1700] Intake/Output from this shift: Total I/O In: 2899 [P.O.:1440; I.V.:1369; IV Piggyback:90] Out: -   Labs: Recent Labs    09/07/17 1021 09/07/17 1500 09/08/17 0328  WBC 15.2*  --  9.3  HGB 12.1  --  10.6*  HCT 37.8  --  33.1*  PLT 397  --  328  CREATININE 1.12*  --  0.88  MG  --  1.5* 2.1  ALBUMIN 4.0  --   --    PROT 7.7  --   --   AST 43*  --   --   ALT 26  --   --   ALKPHOS 118  --   --   BILITOT 0.7  --   --    Estimated Creatinine Clearance: 72.6 mL/min (by C-G formula based on SCr of 0.88 mg/dL).   Microbiology: Recent Results (from the past 720 hour(s))  Blood culture (routine x 2)     Status: None (Preliminary result)   Collection Time: 09/07/17  1:13 PM  Result Value Ref Range Status   Specimen Description BLOOD RIGHT AC  Final   Special Requests   Final    BOTTLES DRAWN AEROBIC AND ANAEROBIC Blood Culture adequate volume   Culture   Final    NO GROWTH < 24 HOURS Performed at [facility], [address], [place], [place] [address]    Report Status PENDING  Incomplete  Blood culture (routine x 2)     Status: None (Preliminary result)   Collection Time: 09/07/17  1:19 PM  Result Value Ref Range Status   Specimen Description BLOOD LEFT AC  Final   Special Requests   Final    BOTTLES DRAWN AEROBIC AND ANAEROBIC Blood Culture results may not be optimal due to an excessive volume of blood received in culture bottles   Culture   Final    NO GROWTH < 24 HOURS Performed at [facility], [address] [place], [place] [address]    Report Status PENDING  Incomplete  C difficile quick scan w PCR reflex     Status: None   Collection Time: 09/07/17  6:58 PM  Result Value Ref Range Status   C Diff antigen NEGATIVE NEGATIVE Final   C Diff toxin NEGATIVE NEGATIVE Final   C Diff interpretation No C. difficile detected.  Final    Comment: Performed at [facility], [address] [place], [place] [address]  Gastrointestinal Panel by PCR , Stool     Status: None   Collection Time: 09/07/17  6:58 PM  Result Value Ref Range Status   Campylobacter species NOT DETECTED NOT DETECTED Final   Plesimonas shigelloides NOT DETECTED NOT DETECTED Final   Salmonella species NOT DETECTED NOT DETECTED Final   Yersinia enterocolitica NOT DETECTED NOT DETECTED Final    Vibrio species NOT DETECTED NOT DETECTED Final   Vibrio cholerae NOT DETECTED NOT DETECTED Final   Enteroaggregative E coli (EAEC) NOT DETECTED NOT DETECTED Final   Enteropathogenic E coli (EPEC) NOT DETECTED NOT DETECTED Final   Enterotoxigenic E coli (ETEC) NOT DETECTED NOT DETECTED Final   Shiga like toxin producing E coli (STEC) NOT DETECTED NOT DETECTED Final   Shigella/Enteroinvasive E coli (EIEC) NOT DETECTED NOT DETECTED Final   Cryptosporidium NOT DETECTED NOT DETECTED Final   Cyclospora cayetanensis NOT DETECTED NOT DETECTED Final   Entamoeba histolytica NOT DETECTED NOT DETECTED Final   Giardia lamblia NOT DETECTED NOT DETECTED Final   Adenovirus F40/41 NOT DETECTED NOT DETECTED Final   Astrovirus NOT DETECTED NOT DETECTED Final   Norovirus GI/GII NOT DETECTED NOT DETECTED Final   Rotavirus A NOT DETECTED NOT DETECTED Final   Sapovirus (I, II, IV, and V) NOT DETECTED NOT DETECTED Final    Comment: Performed at [facility], [address], [place], [place] [address]    Assessment: Patient is a 57yo female admitted for abdominal pain and chronic diarrhea. Pharmacy consulted for electrolyte management.  K=3.2  Goal of Therapy:  Maintain electrolytes within range  Plan:  Will order KCl 69mEq PO once and recheck K level at 18:00.  09/08/17 1806 K 3.6. No further supplement indicated. Will recheck electrolytes tomorrow with AM labs.  Lyah Millirons A. Jordan Hawks, PharmD, BCPS Clinical Pharmacist 09/08/2017 6:57 PM

## 2017-09-08 NOTE — Progress Notes (Signed)
[place] at [place] NAME: Zyairah Wacha    MR#:  [id number]  DATE OF BIRTH:  01-27-1961  SUBJECTIVE: Admitted for abdominal pain.  Chronic diarrhea.  Had  colonoscopy at [facility] recently and a metal clip placed for polyp.  Eager to try regular food.  Told me that she takes 17 mg of prednisone for her Cushing's disease.  CHIEF COMPLAINT:   Chief Complaint  Patient presents with  . Abdominal Pain    REVIEW OF SYSTEMS:   ROS CONSTITUTIONAL: No fever, fatigue or weakness.  EYES: No blurred or double vision.  EARS, NOSE, AND THROAT: No tinnitus or ear pain.  RESPIRATORY: No cough, shortness of breath, wheezing or hemoptysis.  CARDIOVASCULAR: No chest pain, orthopnea, edema.  GASTROINTESTINAL: No nausea, vomiting,  diarrhea.  GENITOURINARY: No dysuria, hematuria.  ENDOCRINE: No polyuria, nocturia,  HEMATOLOGY: No anemia, easy bruising or bleeding SKIN: No rash or lesion. MUSCULOSKELETAL: No joint pain or arthritis.   NEUROLOGIC: No tingling, numbness, weakness.  PSYCHIATRY: No anxiety or depression.   DRUG ALLERGIES:   Allergies  Allergen Reactions  . Levaquin [Levofloxacin In D5w] Anaphylaxis  . Amoxicillin Itching  . Codeine Nausea And Vomiting  . Cymbalta [Duloxetine Hcl] Swelling  . Gatifloxacin Hives and Swelling    Other Reaction: Other reaction: Swelling  . Guaifenesin & Derivatives Hives and Swelling  . Keflex [Cephalexin] Other (See Comments)    Unknown  . Klonopin [Clonazepam] Other (See Comments)    "Makes me have bad thoughts"  . Macrobid [employer] Macro] Other (See Comments)    "kidneys shuts down" Chills, Fever.  . Penicillins Hives    Has patient had a PCN reaction causing immediate rash, facial/tongue/throat swelling, SOB or lightheadedness with hypotension: Yes Has patient had a PCN reaction causing severe rash involving mucus membranes or skin necrosis: No Has patient had a PCN reaction that  required hospitalization: No Has patient had a PCN reaction occurring within the last 10 years: No If all of the above answers are "NO", then may proceed with Cephalosporin use.  . Sulfa Antibiotics Hives  . Amitriptyline Rash    "It makes me want to fall."    VITALS:  Blood pressure 140/71, pulse 82, temperature 98.1 F (36.7 C), resp. rate 19, height 5\' 4"  (1.626 m), weight 77.1 kg (170 lb), SpO2 100 %.  PHYSICAL EXAMINATION:  GENERAL:  57 y.o.-year-old patient lying in the bed with no acute distress.  EYES: Pupils equal, round, reactive to light and accommodation. No scleral icterus. Extraocular muscles intact.  HEENT: Head atraumatic, normocephalic. Oropharynx and nasopharynx clear.  NECK:  Supple, no jugular venous distention. No thyroid enlargement, no tenderness.  LUNGS: Normal breath sounds bilaterally, no wheezing, rales,rhonchi or crepitation. No use of accessory muscles of respiration.  CARDIOVASCULAR: S1, S2 normal. No murmurs, rubs, or gallops.  ABDOMEN: Soft, nontender, nondistended. Bowel sounds present. No organomegaly or mass.  EXTREMITIES: No pedal edema, cyanosis, or clubbing.  NEUROLOGIC: Cranial nerves II through XII are intact. Muscle strength 5/5 in all extremities. Sensation intact. Gait not checked.  PSYCHIATRIC: The patient is alert and oriented x 3.  SKIN: No obvious rash, lesion, or ulcer.    LABORATORY PANEL:   CBC Recent Labs  Lab 09/08/17 0328  WBC 9.3  HGB 10.6*  HCT 33.1*  PLT 328   ------------------------------------------------------------------------------------------------------------------  Chemistries  Recent Labs  Lab 09/07/17 1021  09/08/17 0328  NA 140  --  140  K 3.1*  --  3.2*  CL 98*  --  99*  CO2 26  --  30  GLUCOSE 172*  --  127*  BUN 9  --  8  CREATININE 1.12*  --  0.88  CALCIUM 9.0  --  8.4*  MG  --    < > 2.1  AST 43*  --   --   ALT 26  --   --   ALKPHOS 118  --   --   BILITOT 0.7  --   --    < > = values in  this interval not displayed.   ------------------------------------------------------------------------------------------------------------------  Cardiac Enzymes No results for input(s): TROPONINI in the last 168 hours. ------------------------------------------------------------------------------------------------------------------  RADIOLOGY:  Dg Abd 2 Views  Result Date: 09/07/2017 CLINICAL DATA:  Diarrhea, generalized abdominal pain. EXAM: ABDOMEN - 2 VIEW COMPARISON:  CT scan of August 31, 2017. FINDINGS: The bowel gas pattern is normal. There is no evidence of free air. Linear metallic density is seen in right side of abdomen corresponding to foreign body seen on prior CT scan within the ascending colon. No abnormal calcifications are noted. IMPRESSION: No evidence of bowel obstruction or ileus. Linear metallic density is seen in right side of abdomen corresponding to foreign body seen on prior CT scan within the ascending colon. Electronically Signed   By: Marijo Conception, M.D.   On: 09/07/2017 11:33    EKG:   Orders placed or performed during the hospital encounter of 06/17/15  . EKG 12-Lead  . EKG 12-Lead    ASSESSMENT AND PLAN:   1 #1 abdominal pain secondary to possibly IBS, mitral clip placed at colonoscopy at [facility] showed as foreign body on the CAT scan, seen by gastroenterology signed off. 2.  Chronic diarrhea: Advised to follow-up with [facility] for workup including IBS, celiac disease, malabsorption's: Patient can get Imodium.  Stool C. difficile has been negative for an GI panel has been negative. 3.  Hypokalemia, hypomagnesemia: Replace potassium, magnesium, discontinue IV fluids, start regular diet. Likely discharge tomorrow home. 4.  Cushing's disease: Patient is on chronic prednisone #5 chronic diastolic heart failure: Stable. 6.  Hyperlipidemia: Continue statins. 7.  COPD: No wheezing.    All the records are reviewed and case discussed with Care Management/Social  Workerr. Management plans discussed with the patient, family and they are in agreement.  CODE STATUS: full  TOTAL TIME TAKING CARE OF THIS PATIENT: 35 minutes.   POSSIBLE D/C IN 1- DAYS, DEPENDING ON CLINICAL CONDITION.   Epifanio Lesches M.D on 09/08/2017 at 11:22 AM  Between 7am to 6pm - Pager - [phone]  After 6pm go to [email or web address] - password [email or web address] [facility]  Office  [phone]  CC: Primary care physician; Townsend Roger, MD   Note: This dictation was prepared with Dragon dictation along with smaller phrase technology. Any transcriptional errors that result from this process are unintentional.

## 2017-09-09 LAB — BASIC METABOLIC PANEL
Anion gap: 11 (ref 5–15)
BUN: 10 mg/dL (ref 6–20)
CO2: 31 mmol/L (ref 22–32)
Calcium: 9.3 mg/dL (ref 8.9–10.3)
Chloride: 98 mmol/L — ABNORMAL LOW (ref 101–111)
Creatinine, Ser: 0.98 mg/dL (ref 0.44–1.00)
GFR calc Af Amer: >60 mL/min (ref >60)
GFR calc non Af Amer: >60 mL/min (ref >60)
GLUCOSE: 142 mg/dL — AB (ref 65–99)
Potassium: 2.9 mmol/L — ABNORMAL LOW (ref 3.5–5.1)
Sodium: 140 mmol/L (ref 135–145)

## 2017-09-09 LAB — MAGNESIUM: Magnesium: 1.8 mg/dL (ref 1.7–2.4)

## 2017-09-09 MED ORDER — POTASSIUM CHLORIDE CRYS ER 20 MEQ PO TBCR
40.0000 meq | EXTENDED_RELEASE_TABLET | ORAL | Status: DC
  Administered 2017-09-09: 40 meq via ORAL
  Filled 2017-09-09: qty 2

## 2017-09-09 MED ORDER — METRONIDAZOLE 500 MG PO TABS: 500.0000 mg | ORAL_TABLET | Freq: Three times a day (TID) | ORAL | 0 refills | Status: DC

## 2017-09-09 MED ORDER — METRONIDAZOLE 500 MG PO TABS: 500.0000 mg | ORAL_TABLET | Freq: Three times a day (TID) | ORAL | 0 refills | Status: AC

## 2017-09-09 MED ORDER — POTASSIUM CHLORIDE CRYS ER 20 MEQ PO TBCR: 20.0000 meq | EXTENDED_RELEASE_TABLET | Freq: Two times a day (BID) | ORAL | 0 refills | Status: DC

## 2017-09-09 NOTE — Progress Notes (Signed)
[name]  A and O x 4. VSS. Pt tolerating diet well. No complaints of pain or nausea. IV removed intact, prescriptions given. Pt voiced understanding of discharge instructions with no further questions. Pt discharged via wheelchair with axillary.    Allergies as of 09/09/2017      Reactions   Levaquin [levofloxacin In D5w] Anaphylaxis   Amoxicillin Itching   Codeine Nausea And Vomiting   Cymbalta [duloxetine Hcl] Swelling   Gatifloxacin Hives, Swelling   Other Reaction: Other reaction: Swelling   Guaifenesin & Derivatives Hives, Swelling   Keflex [cephalexin] Other (See Comments)   Unknown   Klonopin [clonazepam] Other (See Comments)   "Makes me have bad thoughts"   Macrobid [nitrofurantoin Monohyd Macro] Other (See Comments)   "kidneys shuts down" Chills, Fever.   Penicillins Hives   Has patient had a PCN reaction causing immediate rash, facial/tongue/throat swelling, SOB or lightheadedness with hypotension: Yes Has patient had a PCN reaction causing severe rash involving mucus membranes or skin necrosis: No Has patient had a PCN reaction that required hospitalization: No Has patient had a PCN reaction occurring within the last 10 years: No If all of the above answers are "NO", then may proceed with Cephalosporin use.   Sulfa Antibiotics Hives   Amitriptyline Rash   "It makes me want to fall."      Medication List    TAKE these medications   ADVAIR DISKUS 250-50 MCG/DOSE Aepb Generic drug:  Fluticasone-Salmeterol Inhale 1 puff into the lungs every 12 (twelve) hours.   ALPRAZolam 1 MG tablet Commonly known as:  XANAX Take 1 tablet by mouth 4 (four) times daily.   Calcium Carbonate-Vitamin D 600-400 MG-UNIT tablet Take 1 tablet by mouth daily.   cyclobenzaprine 10 MG tablet Commonly known as:  FLEXERIL Take 1 tablet by mouth 3 (three) times daily as needed.   donepezil 10 MG tablet Commonly known as:  ARICEPT Take 10 mg by mouth at bedtime.   fluticasone 50  MCG/ACT nasal spray Commonly known as:  FLONASE Place 2 sprays into the nose daily.   furosemide 80 MG tablet Commonly known as:  LASIX Take 2 tablets by mouth daily. May take additional 80mg  if needed   gabapentin 600 MG tablet Commonly known as:  NEURONTIN Take 1 tablet by mouth 3 (three) times daily.   hydrOXYzine 25 MG tablet Commonly known as:  ATARAX/VISTARIL Take 1 tablet by mouth 3 (three) times daily as needed.   magnesium oxide 400 MG tablet Commonly known as:  MAG-OX Take 400 mg by mouth daily.   metroNIDAZOLE 500 MG tablet Commonly known as:  FLAGYL Take 1 tablet (500 mg total) by mouth 3 (three) times daily for 7 days. What changed:  You were already taking a medication with the same name, and this prescription was added. Make sure you understand how and when to take each.   metroNIDAZOLE 500 MG tablet Commonly known as:  FLAGYL Take 1 tablet (500 mg total) by mouth 3 (three) times daily. What changed:  Another medication with the same name was added. Make sure you understand how and when to take each.   omeprazole 40 MG capsule Commonly known as:  PRILOSEC Take 1 capsule by mouth 2 (two) times daily.   oxyCODONE-acetaminophen 10-325 MG tablet Commonly known as:  PERCOCET Take 1 tablet by mouth every 6 (six) hours as needed (pt has to take [place] brand).   potassium chloride SA 20 MEQ tablet Commonly known as:  K-DUR,KLOR-CON Take  1 tablet (20 mEq total) by mouth 2 (two) times daily.   predniSONE 1 MG tablet Commonly known as:  DELTASONE Take 2 mg by mouth daily with breakfast.   predniSONE 5 MG tablet Commonly known as:  DELTASONE Take 15 mg by mouth daily with breakfast.   PROAIR HFA 108 (90 Base) MCG/ACT inhaler Generic drug:  albuterol Inhale 2 puffs into the lungs every 6 (six) hours as needed.   promethazine 25 MG tablet Commonly known as:  PHENERGAN Take 25 mg by mouth every 8 (eight) hours as needed for nausea or vomiting.    ranitidine 150 MG tablet Commonly known as:  ZANTAC Take 150 mg by mouth 2 (two) times daily.   risedronate 35 MG tablet Commonly known as:  ACTONEL Take 35 mg by mouth every 7 (seven) days. with water on empty stomach, nothing by mouth or lie down for next 30 minutes.       Vitals:   09/09/17 0533 09/09/17 1028  BP: 138/80 129/78  Pulse: 86 (!) 109  Resp: 20 18  Temp: (!) 97.4 F (36.3 C) 98.1 F (36.7 C)  SpO2: 100% 98%    Francesco Sor

## 2017-09-09 NOTE — Discharge Summary (Signed)
[name], is a 57 y.o. female  DOB 10/25/1960  MRN [id number].  Admission date:  09/07/2017  Admitting Physician  Demetrios Loll, MD  Discharge Date:  09/09/2017   Primary MD  Townsend Roger, MD  Recommendations for primary care physician for things to follow:  Follow with PCP in 1 week Follow-up with gastroenterologist at [facility] regarding her chronic diarrhea.  Admission Diagnosis  Generalized abdominal pain [R10.84] Diarrhea, unspecified type [R19.7]   Discharge Diagnosis  Generalized abdominal pain [R10.84] Diarrhea, unspecified type [R19.7]    Active Problems:   Abdominal pain   Diarrhea      Past Medical History:  Diagnosis Date  . Adrenal insufficiency ([place])   . Anxiety   . CAD (coronary artery disease)   . Cancer ([place])    skin  . Chronic diastolic CHF (congestive heart failure) ([place])   . Chronic pain   . COPD (chronic obstructive pulmonary disease) ([place])   . Cushing disease ([place])   . Dementia   . GERD (gastroesophageal reflux disease)   . HLD (hyperlipidemia)     Past Surgical History:  Procedure Laterality Date  . APPENDECTOMY    . NASAL SEPTUM SURGERY    . NASAL SINUS SURGERY    . UMBILICAL HERNIA REPAIR    . VAGINAL HYSTERECTOMY         History of present illness and  Hospital Course:     Kindly see H&P for history of present illness and admission details, please review complete Labs, Consult reports and Test reports for all details in brief  HPI  from the history and physical done on the day of admission 57 year old female patient admitted for abdominal pain, CT abdomen in the emergency room showed possible foreign body.   Hospital Course  #1 acute abdominal pain and diarrhea likely viral.  Stool C. difficile has been negative.  Foreign body present on the CAT scan is secondary to metal  clip that placed on polyp area with a heart recent colonoscopy at [facility].  Patient received Imodium for diarrhea after C. difficile has been negative.  Her abdominal pain also improved.  Patient received Flagyl in the hospital due to her history of immunosuppression with prednisone that she takes for Cushing's disease.  Seen by gastroenterology Dr. Aundria Rud did not recommend any further workup.  Patient can follow-up with [facility] who is her primary gastroenterologist advised to follow-up with them for diarrhea and possible IBS.  Patient had recent EGD, colonoscopy.  Discharged home with Flagyl 500 3 times daily for 5 days diarrhea.  Patient had elevated lactic acid on admission of 3 and now down to 1.9.  2.  Hypokalemia, hypomagnesemia secondary to GI losses from diarrhea: Patient advised to continue 19 M EQ potassium for a few more days and then resume her 70 M EQ potassium.  And today she feels much better no further diarrhea or abdominal pain and tolerating the diet, eager to go home. 3.Cushing's disease: Patient is on chronic prednisone 4,chronic diastolic heart failure: Stable.  Home dose Lasix. 5,.  Hyperlipidemia: Continue statins. 6. COPD: No wheezing.  Continue home medicines. Possible Lewy body dementia: Patient takes Aricept, follows up at [facility].     Discharge Condition:  stable   Follow UP  Follow-up Information    Townsend Roger, MD. Go on 09/16/2017.   Specialty:  Internal Medicine Why:  Thursday at 9:45am for hospital follow-up Contact information: [place]  [place] [address] [phone]             Discharge Instructions  and  Discharge Medications     Discharge Instructions    Gastrointestinal Panel by PCR , Stool   Complete by:  As directed      Allergies as of 09/09/2017      Reactions   Levaquin [levofloxacin In D5w] Anaphylaxis   Amoxicillin Itching   Codeine Nausea And Vomiting   Cymbalta [duloxetine Hcl] Swelling    Gatifloxacin Hives, Swelling   Other Reaction: Other reaction: Swelling   Guaifenesin & Derivatives Hives, Swelling   Keflex [cephalexin] Other (See Comments)   Unknown   Klonopin [clonazepam] Other (See Comments)   "Makes me have bad thoughts"   Macrobid [nitrofurantoin Monohyd Macro] Other (See Comments)   "kidneys shuts down" Chills, Fever.   Penicillins Hives   Has patient had a PCN reaction causing immediate rash, facial/tongue/throat swelling, SOB or lightheadedness with hypotension: Yes Has patient had a PCN reaction causing severe rash involving mucus membranes or skin necrosis: No Has patient had a PCN reaction that required hospitalization: No Has patient had a PCN reaction occurring within the last 10 years: No If all of the above answers are "NO", then may proceed with Cephalosporin use.   Sulfa Antibiotics Hives   Amitriptyline Rash   "It makes me want to fall."      Medication List    TAKE these medications   ADVAIR DISKUS 250-50 MCG/DOSE Aepb Generic drug:  Fluticasone-Salmeterol Inhale 1 puff into the lungs every 12 (twelve) hours.   ALPRAZolam 1 MG tablet Commonly known as:  XANAX Take 1 tablet by mouth 4 (four) times daily.   Calcium Carbonate-Vitamin D 600-400 MG-UNIT tablet Take 1 tablet by mouth daily.   cyclobenzaprine 10 MG tablet Commonly known as:  FLEXERIL Take 1 tablet by mouth 3 (three) times daily as needed.   donepezil 10 MG tablet Commonly known as:  ARICEPT Take 10 mg by mouth at bedtime.   fluticasone 50 MCG/ACT nasal spray Commonly known as:  FLONASE Place 2 sprays into the nose daily.   furosemide 80 MG tablet Commonly known as:  LASIX Take 2 tablets by mouth daily. May take additional 80mg  if needed   gabapentin 600 MG tablet Commonly known as:  NEURONTIN Take 1 tablet by mouth 3 (three) times daily.   hydrOXYzine 25 MG tablet Commonly known as:  ATARAX/VISTARIL Take 1 tablet by mouth 3 (three) times daily as needed.    magnesium oxide 400 MG tablet Commonly known as:  MAG-OX Take 400 mg by mouth daily.   metroNIDAZOLE 500 MG tablet Commonly known as:  FLAGYL Take 1 tablet (500 mg total) by mouth 3 (three) times daily for 7 days. What changed:  You were already taking a medication with the same name, and this prescription was added. Make sure you understand how and when to take each.   metroNIDAZOLE 500 MG tablet Commonly known as:  FLAGYL Take 1 tablet (500 mg total) by mouth 3 (three) times daily. What changed:  Another medication with the same name was added. Make sure you understand how and when to take each.   omeprazole 40 MG capsule Commonly known as:  PRILOSEC Take 1 capsule by mouth 2 (two) times daily.   oxyCODONE-acetaminophen 10-325 MG tablet Commonly known as:  PERCOCET Take 1 tablet by mouth every 6 (six) hours as needed (pt has to take [place] brand).   potassium chloride SA 20 MEQ tablet  Commonly known as:  K-DUR,KLOR-CON Take 1 tablet (20 mEq total) by mouth 2 (two) times daily.   predniSONE 1 MG tablet Commonly known as:  DELTASONE Take 2 mg by mouth daily with breakfast.   predniSONE 5 MG tablet Commonly known as:  DELTASONE Take 15 mg by mouth daily with breakfast.   PROAIR HFA 108 (90 Base) MCG/ACT inhaler Generic drug:  albuterol Inhale 2 puffs into the lungs every 6 (six) hours as needed.   promethazine 25 MG tablet Commonly known as:  PHENERGAN Take 25 mg by mouth every 8 (eight) hours as needed for nausea or vomiting.   ranitidine 150 MG tablet Commonly known as:  ZANTAC Take 150 mg by mouth 2 (two) times daily.   risedronate 35 MG tablet Commonly known as:  ACTONEL Take 35 mg by mouth every 7 (seven) days. with water on empty stomach, nothing by mouth or lie down for next 30 minutes.         Diet and Activity recommendation: See Discharge Instructions above   Consults obtained -GI   Major procedures and Radiology Reports - PLEASE review  detailed and final reports for all details, in brief -      Ct Abdomen Pelvis W Contrast  Result Date: 08/31/2017 CLINICAL DATA:  57 year old female with acute abdominal and pelvic pain radiating to the back. 4 days of vaginal bleeding. EXAM: CT ABDOMEN AND PELVIS WITH CONTRAST TECHNIQUE: Multidetector CT imaging of the abdomen and pelvis was performed using the standard protocol following bolus administration of intravenous contrast. CONTRAST:  136mL ISOVUE-300 IOPAMIDOL (ISOVUE-300) INJECTION 61% COMPARISON:  Abdomen ultrasound 07/11/2013. Lumbar MRI 07/10/2011. CT Abdomen and Pelvis 04/06/2007. FINDINGS: Lower chest: Negative lung bases. No pericardial or pleural effusion. Hepatobiliary: Stable and negative liver; a small round 8 millimeter low-density area in the central liver on series 2, image 19 is unchanged since 2008. Negative gallbladder. No biliary ductal enlargement. Pancreas: Negative. Spleen: Negative. Adrenals/Urinary Tract: Normal adrenal glands. Punctate left nephrolithiasis in the midpole and lower pole. No right nephrolithiasis identified. No hydronephrosis or perinephric stranding. Bilateral renal enhancement and contrast excretion is symmetric and normal. No hydronephrosis or hydroureter. Negative course of both ureters. Unremarkable urinary bladder. Stomach/Bowel: The rectosigmoid colon are decompressed and negative. There is mild to moderate diverticulosis in the descending colon and junction with the proximal sigmoid, however, no active inflammation is identified. The descending colon and the scratched at the descending colon is largely decompressed. Minimal retained stool in the transverse colon. Mild transverse colon redundancy. The right colon is nondilated and non inflamed, but there is a 2 centimeter linear (by 4 millimeter in thickness dense or metallic foreign body appearing object in the ascending colon. See coronal image 39. This is also visible on the scout view. This was not  present in 2008. The appendix is surgically absent. The cecum and terminal ileum appear normal. No dilated small bowel. Negative stomach and duodenum. No abdominal free air or free fluid. Vascular/Lymphatic: Aortoiliac calcified atherosclerosis. Major arterial structures in the abdomen and pelvis are patent. Portal venous system is patent. No lymphadenopathy. Reproductive: Surgically absent uterus. The vaginal cuff appears unremarkable. Neither ovary is identified. A small posterior right pelvic surgical clip is unchanged since 2008. Other: No pelvic free fluid. Musculoskeletal: Chronic appearing posterior left 8th and 9th rib fractures are new since the 2008 CT. Stable lower thoracic and lumbar vertebral height and alignment compared to the 2012 MRI. Chronic fractures of the junction of the right superior pubic ramus with  the right acetabulum (series 2, image 82) and right inferior pubic ramus (image 90) are new since the 2008 CT. No acute osseous abnormality identified. IMPRESSION: 1. Rod-like 2 cm x 4 mm thick metallic or hyperdense foreign body appearing object in the ascending colon is of unclear etiology and significance. No associated bowel obstruction or inflammation. 2. Left nephrolithiasis, but no obstructive uropathy. 3. Surgically absent uterus and ovaries. The vaginal cuff is unremarkable. 4. Chronic left lower rib and right pelvic fractures which are new since a 2008 CT. No acute osseous abnormality identified. Electronically Signed   By: Genevie Ann M.D.   On: 08/31/2017 16:59   Dg Abd 2 Views  Result Date: 09/07/2017 CLINICAL DATA:  Diarrhea, generalized abdominal pain. EXAM: ABDOMEN - 2 VIEW COMPARISON:  CT scan of August 31, 2017. FINDINGS: The bowel gas pattern is normal. There is no evidence of free air. Linear metallic density is seen in right side of abdomen corresponding to foreign body seen on prior CT scan within the ascending colon. No abnormal calcifications are noted. IMPRESSION: No  evidence of bowel obstruction or ileus. Linear metallic density is seen in right side of abdomen corresponding to foreign body seen on prior CT scan within the ascending colon. Electronically Signed   By: Marijo Conception, M.D.   On: 09/07/2017 11:33    Micro Results     Recent Results (from the past 240 hour(s))  Blood culture (routine x 2)     Status: None (Preliminary result)   Collection Time: 09/07/17  1:13 PM  Result Value Ref Range Status   Specimen Description BLOOD RIGHT [facility]  Final   Special Requests   Final    BOTTLES DRAWN AEROBIC AND ANAEROBIC Blood Culture adequate volume   Culture   Final    NO GROWTH 2 DAYS Performed at [facility], [address] [place], [place] [address]    Report Status PENDING  Incomplete  Blood culture (routine x 2)     Status: None (Preliminary result)   Collection Time: 09/07/17  1:19 PM  Result Value Ref Range Status   Specimen Description BLOOD LEFT AC  Final   Special Requests   Final    BOTTLES DRAWN AEROBIC AND ANAEROBIC Blood Culture results may not be optimal due to an excessive volume of blood received in culture bottles   Culture   Final    NO GROWTH 2 DAYS Performed at [facility], [address], [place], [place] [address]    Report Status PENDING  Incomplete  C difficile quick scan w PCR reflex     Status: None   Collection Time: 09/07/17  6:58 PM  Result Value Ref Range Status   C Diff antigen NEGATIVE NEGATIVE Final   C Diff toxin NEGATIVE NEGATIVE Final   C Diff interpretation No C. difficile detected.  Final    Comment: Performed at [facility], [address] [place], [place] [address]  Gastrointestinal Panel by PCR , Stool     Status: None   Collection Time: 09/07/17  6:58 PM  Result Value Ref Range Status   Campylobacter species NOT DETECTED NOT DETECTED Final   Plesimonas shigelloides NOT DETECTED NOT DETECTED Final   Salmonella species NOT DETECTED NOT DETECTED Final   Yersinia  enterocolitica NOT DETECTED NOT DETECTED Final   Vibrio species NOT DETECTED NOT DETECTED Final   Vibrio cholerae NOT DETECTED NOT DETECTED Final   Enteroaggregative E coli (EAEC) NOT DETECTED NOT DETECTED  Final   Enteropathogenic E coli (EPEC) NOT DETECTED NOT DETECTED Final   Enterotoxigenic E coli (ETEC) NOT DETECTED NOT DETECTED Final   Shiga like toxin producing E coli (STEC) NOT DETECTED NOT DETECTED Final   Shigella/Enteroinvasive E coli (EIEC) NOT DETECTED NOT DETECTED Final   Cryptosporidium NOT DETECTED NOT DETECTED Final   Cyclospora cayetanensis NOT DETECTED NOT DETECTED Final   Entamoeba histolytica NOT DETECTED NOT DETECTED Final   Giardia lamblia NOT DETECTED NOT DETECTED Final   Adenovirus F40/41 NOT DETECTED NOT DETECTED Final   Astrovirus NOT DETECTED NOT DETECTED Final   Norovirus GI/GII NOT DETECTED NOT DETECTED Final   Rotavirus A NOT DETECTED NOT DETECTED Final   Sapovirus (I, II, IV, and V) NOT DETECTED NOT DETECTED Final    Comment: Performed at [facility], [address] [place], [place] [address]       Today   Subjective:   [name] today has no headache,no chest abdominal pain,no new weakness tingling or numbness, feels much better wants to go home today.   Objective:   Blood pressure 129/78, pulse (!) 109, temperature 98.1 F (36.7 C), resp. rate 18, height 5\' 4"  (1.626 m), weight 77.1 kg (170 lb), SpO2 98 %.   Intake/Output Summary (Last 24 hours) at 09/09/2017 1436 Last data filed at 09/09/2017 1022 Gross per 24 hour  Intake 960 ml  Output -  Net 960 ml    Exam Awake Alert, Oriented x 3, No new F.N deficits, Normal affect [place].AT,PERRAL Supple Neck,No JVD, No cervical lymphadenopathy appriciated.  Symmetrical Chest wall movement, Good air movement bilaterally, CTAB RRR,No Gallops,Rubs or new Murmurs, No Parasternal Heave +ve B.Sounds, Abd Soft, Non tender, No organomegaly appriciated, No rebound -guarding or rigidity. No  Cyanosis, Clubbing or edema, No new Rash or bruise  Data Review   CBC w Diff:  Lab Results  Component Value Date   WBC 9.3 09/08/2017   HGB 10.6 (L) 09/08/2017   HCT 33.1 (L) 09/08/2017   PLT 328 09/08/2017   LYMPHOPCT 8 05/15/2017   MONOPCT 7 05/15/2017   EOSPCT 1 05/15/2017   BASOPCT 0 05/15/2017    CMP:  Lab Results  Component Value Date   NA 140 09/09/2017   K 2.9 (L) 09/09/2017   CL 98 (L) 09/09/2017   CO2 31 09/09/2017   BUN 10 09/09/2017   CREATININE 0.98 09/09/2017   PROT 7.7 09/07/2017   ALBUMIN 4.0 09/07/2017   BILITOT 0.7 09/07/2017   ALKPHOS 118 09/07/2017   AST 43 (H) 09/07/2017   ALT 26 09/07/2017  .   Total Time in preparing paper work, data evaluation and todays exam - 35 minutes  Epifanio Lesches M.D on 09/09/2017 at 2:36 PM    Note: This dictation was prepared with Dragon dictation along with smaller phrase technology. Any transcriptional errors that result from this process are unintentional.

## 2017-09-09 NOTE — Progress Notes (Signed)
Called Dr. Marcille Blanco regarding potassium level- 2.9.  Appropriate orders were placed.  Christene Slates  09/09/2017  6:59 AM

## 2017-09-12 LAB — CULTURE, BLOOD (ROUTINE X 2)
Culture: NO GROWTH
Culture: NO GROWTH
Special Requests: ADEQUATE

## 2017-11-01 ENCOUNTER — Other Ambulatory Visit: Payer: Self-pay | Admitting: Family Medicine

## 2017-11-01 DIAGNOSIS — R1310 Dysphagia, unspecified: Secondary | ICD-10-CM

## 2017-11-17 DIAGNOSIS — E611 Iron deficiency: Secondary | ICD-10-CM | POA: Insufficient documentation

## 2017-11-23 ENCOUNTER — Ambulatory Visit: Payer: Medicare Other

## 2018-03-08 DIAGNOSIS — Z720 Tobacco use: Secondary | ICD-10-CM | POA: Insufficient documentation

## 2018-04-18 ENCOUNTER — Emergency Department: Payer: Medicare Other

## 2018-04-18 ENCOUNTER — Emergency Department
Admission: EM | Admit: 2018-04-18 | Discharge: 2018-04-18 | Disposition: A | Discharge status: Home / Self Care | Payer: Medicare Other | Attending: Emergency Medicine | Admitting: Emergency Medicine

## 2018-04-18 ENCOUNTER — Encounter: Payer: Self-pay | Admitting: Emergency Medicine

## 2018-04-18 ENCOUNTER — Other Ambulatory Visit: Payer: Self-pay

## 2018-04-18 DIAGNOSIS — I5032 Chronic diastolic (congestive) heart failure: Secondary | ICD-10-CM | POA: Diagnosis not present

## 2018-04-18 DIAGNOSIS — I2510 Atherosclerotic heart disease of native coronary artery without angina pectoris: Secondary | ICD-10-CM | POA: Diagnosis not present

## 2018-04-18 DIAGNOSIS — M79605 Pain in left leg: Secondary | ICD-10-CM

## 2018-04-18 DIAGNOSIS — F17210 Nicotine dependence, cigarettes, uncomplicated: Secondary | ICD-10-CM | POA: Insufficient documentation

## 2018-04-18 DIAGNOSIS — M79652 Pain in left thigh: Secondary | ICD-10-CM | POA: Insufficient documentation

## 2018-04-18 DIAGNOSIS — M25562 Pain in left knee: Secondary | ICD-10-CM | POA: Insufficient documentation

## 2018-04-18 DIAGNOSIS — Z85828 Personal history of other malignant neoplasm of skin: Secondary | ICD-10-CM | POA: Insufficient documentation

## 2018-04-18 DIAGNOSIS — J449 Chronic obstructive pulmonary disease, unspecified: Secondary | ICD-10-CM | POA: Diagnosis not present

## 2018-04-18 DIAGNOSIS — Z79899 Other long term (current) drug therapy: Secondary | ICD-10-CM | POA: Insufficient documentation

## 2018-04-18 DIAGNOSIS — F0390 Unspecified dementia without behavioral disturbance: Secondary | ICD-10-CM | POA: Insufficient documentation

## 2018-04-18 NOTE — ED Provider Notes (Signed)
[facility] Emergency Department Provider Note    ____________________________________________   I have reviewed the triage vital signs and the nursing notes.   HISTORY  Chief Complaint Leg Pain   History limited by: Not Limited   HPI [name] is a 57 y.o. female who presents to the emergency department today because of concerns for acute on chronic left leg..  Patient states that she has chronic left leg pain.  For the past few days however the pain is been more severe.  Is located around her knee and thigh.  It will occasionally go down her calf.  Describes it as feeling like what she would expect tennis to elbow to feel like however in her knee.  She contacted her doctor today who was concerned for potential blood clot.  Patient denies any fevers.  Does have complaint of occasional left nostril pain and bloody mucus.    Per medical record review patient has a history of adrenal insufficiency  Past Medical History:  Diagnosis Date  . Adrenal insufficiency ([place])   . Anxiety   . CAD (coronary artery disease)   . Cancer ([place])    skin  . Chronic diastolic CHF (congestive heart failure) ([place])   . Chronic pain   . COPD (chronic obstructive pulmonary disease) ([place])   . Cushing disease ([place])   . Dementia   . GERD (gastroesophageal reflux disease)   . HLD (hyperlipidemia)     Patient Active Problem List   Diagnosis Date Noted  . Abdominal pain 09/07/2017  . Diarrhea   . Chest pain 06/17/2015  . Elevated troponin 06/17/2015  . Fracture, ribs 06/17/2015  . GERD (gastroesophageal reflux disease) 06/17/2015  . COPD (chronic obstructive pulmonary disease) ([place]) 06/17/2015  . Chronic pain 06/17/2015  . Anxiety 06/17/2015  . Adrenal insufficiency ([place]) 06/17/2015    Past Surgical History:  Procedure Laterality Date  . APPENDECTOMY    . NASAL SEPTUM SURGERY    . NASAL SINUS SURGERY    . UMBILICAL HERNIA REPAIR    . VAGINAL HYSTERECTOMY       Prior to Admission medications   Medication Sig Start Date End Date Taking? Authorizing Provider  albuterol (PROAIR HFA) 108 (90 BASE) MCG/ACT inhaler Inhale 2 puffs into the lungs every 6 (six) hours as needed. 06/04/15   [provider]  ALPRAZolam Duanne Moron) 1 MG tablet Take 1 tablet by mouth 4 (four) times daily.    [provider]  Calcium Carbonate-Vitamin D 600-400 MG-UNIT tablet Take 1 tablet by mouth daily. 05/22/15 09/07/17  [provider]  cyclobenzaprine (FLEXERIL) 10 MG tablet Take 1 tablet by mouth 3 (three) times daily as needed.    [provider]  donepezil (ARICEPT) 10 MG tablet Take 10 mg by mouth at bedtime.    [provider]  fluticasone (FLONASE) 50 MCG/ACT nasal spray Place 2 sprays into the nose daily. 01/02/13   [provider]  Fluticasone-Salmeterol (ADVAIR DISKUS) 250-50 MCG/DOSE AEPB Inhale 1 puff into the lungs every 12 (twelve) hours. 06/04/15 09/07/17  [provider]  furosemide (LASIX) 80 MG tablet Take 2 tablets by mouth daily. May take additional 80mg  if needed    [provider]  gabapentin (NEURONTIN) 600 MG tablet Take 1 tablet by mouth 3 (three) times daily.    [provider]  hydrOXYzine (ATARAX/VISTARIL) 25 MG tablet Take 1 tablet by mouth 3 (three) times daily as needed. 01/02/13   [provider]  magnesium oxide (MAG-OX)  400 MG tablet Take 400 mg by mouth daily.    [provider]  metroNIDAZOLE (FLAGYL) 500 MG tablet Take 1 tablet (500 mg total) by mouth 3 (three) times daily. 09/09/17   Epifanio Lesches, MD  omeprazole (PRILOSEC) 40 MG capsule Take 1 capsule by mouth 2 (two) times daily. 02/19/15 09/07/17  [provider]  oxyCODONE-acetaminophen (PERCOCET) 10-325 MG tablet Take 1 tablet by mouth every 6 (six) hours as needed (pt has to take mallinckrodt brand).  10/05/12   [provider]  potassium chloride SA (K-DUR,KLOR-CON) 20 MEQ  tablet Take 1 tablet (20 mEq total) by mouth 2 (two) times daily. 09/09/17   Epifanio Lesches, MD  predniSONE (DELTASONE) 1 MG tablet Take 2 mg by mouth daily with breakfast.    [provider]  predniSONE (DELTASONE) 5 MG tablet Take 15 mg by mouth daily with breakfast.    [provider]  promethazine (PHENERGAN) 25 MG tablet Take 25 mg by mouth every 8 (eight) hours as needed for nausea or vomiting.    [provider]  ranitidine (ZANTAC) 150 MG tablet Take 150 mg by mouth 2 (two) times daily.    [provider]  risedronate (ACTONEL) 35 MG tablet Take 35 mg by mouth every 7 (seven) days. with water on empty stomach, nothing by mouth or lie down for next 30 minutes.    [provider]    Allergies Bupropion; Levaquin [levofloxacin in d5w]; Amoxicillin; Codeine; Cymbalta [duloxetine hcl]; Gatifloxacin; Guaifenesin & derivatives; Keflex [cephalexin]; Klonopin [clonazepam]; Macrobid [nitrofurantoin monohyd macro]; Penicillins; Sulfa antibiotics; and Amitriptyline  Family History  Problem Relation Age of Onset  . Ovarian cancer Mother   . Breast cancer Mother   . Brain cancer Mother   . CAD Father   . Diabetes Father   . Hepatitis Father   . Liver disease Father   . Cancer Father   . Alcohol abuse Father   . Heart attack Brother   . Sleep apnea Brother   . Diabetes Brother   . ALS Paternal Grandmother   . Stroke Maternal Grandmother     Social History Social History   Tobacco Use  . Smoking status: Current Every Day Smoker    Packs/day: 0.50    Types: Cigarettes  . Smokeless tobacco: Never Used  Substance Use Topics  . Alcohol use: Yes    Alcohol/week: 0.0 standard drinks    Comment: rarely  . Drug use: No    Review of Systems Constitutional: No fever/chills Eyes: No visual changes. ENT: Positive for nose pain, bleeding.  Cardiovascular: Denies chest pain. Respiratory: Denies shortness of breath. Gastrointestinal: No  abdominal pain.  No nausea, no vomiting.  No diarrhea.   Genitourinary: Negative for dysuria. Musculoskeletal: Positive for left leg pain. Skin: Negative for rash. Neurological: Negative for headaches, focal weakness or numbness.  ____________________________________________   PHYSICAL EXAM:  VITAL SIGNS: ED Triage Vitals  Enc Vitals Group     BP --      Pulse --      Resp --      Temp --      Temp src --      SpO2 --      Weight 04/18/18 1442 174 lb (78.9 kg)     Height 04/18/18 1442 5\' 4"  (1.626 m)     Head Circumference --      Peak Flow --      Pain Score 04/18/18 1441 10     Pain Loc --  Pain Edu? --      Excl. in [place]? --      Constitutional: Alert and oriented.  Eyes: Conjunctivae are normal.  ENT      Head: Normocephalic and atraumatic.      Nose: No congestion/rhinnorhea. No bleeding noted in left nares.      Mouth/Throat: Mucous membranes are moist.      Neck: No stridor. Hematological/Lymphatic/Immunilogical: No cervical lymphadenopathy. Cardiovascular: Normal rate, regular rhythm.  No murmurs, rubs, or gallops. Respiratory: Normal respiratory effort without tachypnea nor retractions. Breath sounds are clear and equal bilaterally. No wheezes/rales/rhonchi. Gastrointestinal: Soft and non tender. No rebound. No guarding.  Genitourinary: Deferred Musculoskeletal: Normal range of motion in all extremities. No lower extremity edema. Neurologic:  Normal speech and language. No gross focal neurologic deficits are appreciated.  Skin:  Skin is warm, dry and intact. No rash noted. Psychiatric: Mood and affect are normal. Speech and behavior are normal. Patient exhibits appropriate insight and judgment.  ____________________________________________    LABS (pertinent positives/negatives)  None  ____________________________________________   EKG  None  ____________________________________________    RADIOLOGY  [place] left lower extremity No obvious  DVT  ____________________________________________   PROCEDURES  Procedures  ____________________________________________   INITIAL IMPRESSION / ASSESSMENT AND PLAN / ED COURSE  Pertinent labs & imaging results that were available during my care of the patient were reviewed by me and considered in my medical decision making (see chart for details).   Patient presented to the emergency department today because of concerns for left leg pain and blood clot per primary care physician.  Ultrasound does not show DVT.  Physical exam is benign.  No erythema swelling or concerning findings.  Cells pedis 2+ left leg.  Will have patient follow-up with primary care.  ____________________________________________   FINAL CLINICAL IMPRESSION(S) / ED DIAGNOSES  Final diagnoses:  Left leg pain     Note: This dictation was prepared with Dragon dictation. Any transcriptional errors that result from this process are unintentional     Nance Pear, MD 04/18/18 [phone]

## 2018-04-18 NOTE — ED Notes (Signed)
Patient transported to Ultrasound 

## 2018-04-18 NOTE — Discharge Instructions (Addendum)
Please seek medical attention for any high fevers, chest pain, shortness of breath, change in behavior, persistent vomiting, bloody stool or any other new or concerning symptoms.  

## 2018-04-18 NOTE — ED Triage Notes (Addendum)
Pt arrived via POV with reports of left leg pain and swelling behind the knee and thigh for the past 2 weeks.  Pt states she has hx of cancer in the left leg (melanoma) in the past and was referred to ED due to worried about a blood clot in the leg.  Pt denies any hx of blood clot, pt states she recently went to the beach last week but states her leg was hurting before then.

## 2018-04-18 NOTE — ED Notes (Signed)
Pt had concerns about her leg "scan" this Rn reviewed results with her, felt for left pedal pulse, marked site, told pt to have them feel for a left pedal pulse at her Wed Dr appointment for further concerns. Pt verbalized understanding and appreciated time spent.

## 2018-07-09 ENCOUNTER — Other Ambulatory Visit: Payer: Self-pay

## 2018-07-09 ENCOUNTER — Encounter: Payer: Self-pay | Admitting: Emergency Medicine

## 2018-07-09 ENCOUNTER — Emergency Department
Admission: EM | Admit: 2018-07-09 | Discharge: 2018-07-09 | Disposition: A | Discharge status: Home / Self Care | Payer: Medicare Other | Attending: Emergency Medicine | Admitting: Emergency Medicine

## 2018-07-09 ENCOUNTER — Emergency Department: Payer: Medicare Other

## 2018-07-09 DIAGNOSIS — Z79899 Other long term (current) drug therapy: Secondary | ICD-10-CM | POA: Diagnosis not present

## 2018-07-09 DIAGNOSIS — R911 Solitary pulmonary nodule: Secondary | ICD-10-CM | POA: Diagnosis not present

## 2018-07-09 DIAGNOSIS — R0789 Other chest pain: Secondary | ICD-10-CM

## 2018-07-09 DIAGNOSIS — Z85828 Personal history of other malignant neoplasm of skin: Secondary | ICD-10-CM | POA: Diagnosis not present

## 2018-07-09 DIAGNOSIS — I5032 Chronic diastolic (congestive) heart failure: Secondary | ICD-10-CM | POA: Diagnosis not present

## 2018-07-09 DIAGNOSIS — F419 Anxiety disorder, unspecified: Secondary | ICD-10-CM | POA: Diagnosis not present

## 2018-07-09 DIAGNOSIS — F0390 Unspecified dementia without behavioral disturbance: Secondary | ICD-10-CM | POA: Insufficient documentation

## 2018-07-09 DIAGNOSIS — R05 Cough: Secondary | ICD-10-CM | POA: Diagnosis present

## 2018-07-09 DIAGNOSIS — I2510 Atherosclerotic heart disease of native coronary artery without angina pectoris: Secondary | ICD-10-CM | POA: Insufficient documentation

## 2018-07-09 DIAGNOSIS — F17210 Nicotine dependence, cigarettes, uncomplicated: Secondary | ICD-10-CM | POA: Diagnosis not present

## 2018-07-09 DIAGNOSIS — J40 Bronchitis, not specified as acute or chronic: Secondary | ICD-10-CM | POA: Diagnosis not present

## 2018-07-09 DIAGNOSIS — R059 Cough, unspecified: Secondary | ICD-10-CM

## 2018-07-09 DIAGNOSIS — J449 Chronic obstructive pulmonary disease, unspecified: Secondary | ICD-10-CM | POA: Diagnosis not present

## 2018-07-09 MED ORDER — IPRATROPIUM-ALBUTEROL 0.5-2.5 (3) MG/3ML IN SOLN
3.0000 mL | Freq: Once | RESPIRATORY_TRACT | Status: AC
  Administered 2018-07-09: 3 mL via RESPIRATORY_TRACT
  Filled 2018-07-09: qty 3

## 2018-07-09 MED ORDER — DOXYCYCLINE HYCLATE 100 MG PO CAPS: ORAL_CAPSULE | ORAL | 0 refills | Status: DC

## 2018-07-09 MED ORDER — BENZONATATE 100 MG PO CAPS: 100.0000 mg | ORAL_CAPSULE | Freq: Three times a day (TID) | ORAL | 0 refills | Status: DC | PRN

## 2018-07-09 MED ORDER — AZITHROMYCIN 250 MG PO TABS: ORAL_TABLET | ORAL | 0 refills | Status: DC

## 2018-07-09 NOTE — Discharge Instructions (Addendum)
As we discussed, there is no sign of pneumonia on your chest x-ray and you do not have any broken ribs.  We believe that you have bronchitis from a viral infection which is causing your persistent cough which is made your ribs and chest wall sore.  Given your history of COPD and the productive cough you are experiencing, I did prescribe for you a course of antibiotics called azithromycin.  Please take it according to label instructions.  I also wrote a prescription for Tessalon Perles which can help with cough.  I cannot prescribe for you any cough syrup because it will interact with the medicine you take for chronic pain and anxiety.    Please do not continue taking the antibiotics you filled from a prior prescription, those antibiotics will not help your current situation.  Please continue to use all of your normal medications including your breathing treatments and steroids.  Follow-up with your regular doctor at the next available opportunity and return to the emergency department if you develop any new or worsening symptoms.  Please also let your primary care doctor know that you have some pulmonary nodules on your chest x-ray.  These are usually benign, but the radiologist recommended that you tell your primary care provider and have an outpatient CT scan of your chest when you have the opportunity.

## 2018-07-09 NOTE — ED Provider Notes (Addendum)
[facility] Emergency Department Provider Note  ____________________________________________   First MD Initiated Contact with Patient 07/09/18 [phone]     (approximate)  I have reviewed the triage vital signs and the nursing notes.   HISTORY  Chief Complaint Cough and Wheezing    HPI [name] is a 57 y.o. female with medical history as listed below who presents by private vehicle for evaluation of cough, chest pain when she coughs or takes a deep breath, reported fever at home, and general malaise.  She states that she has already doubled her steroids as per her instructions from her regular doctor when she gets sick; she has Cushing's disease and is told to take more steroids.  She does have a history of COPD and says this feels a little bit like that.  Her cough is frequently productive of sputum.  She has pain in both sides of her ribs when she coughs and her sides are also sore to the touch.  She is not having any shortness of breath, just the cough.  She is taking her regular medications but she is not feeling any better.  She notes that she had an old prescription for clindamycin that had an extra refill on it so she fell that the other day and has been taking the clindamycin but it has not been helping.  She thinks she originally got the clindamycin for a skin infection.  Past Medical History:  Diagnosis Date  . Adrenal insufficiency ([employer])   . Anxiety   . CAD (coronary artery disease)   . Cancer ([place])    skin  . Chronic diastolic CHF (congestive heart failure) ([place])   . Chronic pain   . COPD (chronic obstructive pulmonary disease) ([place])   . Cushing disease ([place])   . Dementia ([place])   . GERD (gastroesophageal reflux disease)   . HLD (hyperlipidemia)     Patient Active Problem List   Diagnosis Date Noted  . Abdominal pain 09/07/2017  . Diarrhea   . Chest pain 06/17/2015  . Elevated troponin 06/17/2015  . Fracture, ribs 06/17/2015  . GERD  (gastroesophageal reflux disease) 06/17/2015  . COPD (chronic obstructive pulmonary disease) ([place]) 06/17/2015  . Chronic pain 06/17/2015  . Anxiety 06/17/2015  . Adrenal insufficiency ([place]) 06/17/2015    Past Surgical History:  Procedure Laterality Date  . APPENDECTOMY    . NASAL SEPTUM SURGERY    . NASAL SINUS SURGERY    . UMBILICAL HERNIA REPAIR    . VAGINAL HYSTERECTOMY      Prior to Admission medications   Medication Sig Start Date End Date Taking? Authorizing Provider  albuterol (PROAIR HFA) 108 (90 BASE) MCG/ACT inhaler Inhale 2 puffs into the lungs every 6 (six) hours as needed. 06/04/15   [provider]  ALPRAZolam Duanne Moron) 1 MG tablet Take 1 tablet by mouth 4 (four) times daily.    [provider]  benzonatate (TESSALON PERLES) 100 MG capsule Take 1 capsule (100 mg total) by mouth 3 (three) times daily as needed for cough. 07/09/18   Hinda Kehr, MD  Calcium Carbonate-Vitamin D 600-400 MG-UNIT tablet Take 1 tablet by mouth daily. 05/22/15 09/07/17  [provider]  cyclobenzaprine (FLEXERIL) 10 MG tablet Take 1 tablet by mouth 3 (three) times daily as needed.    [provider]  donepezil (ARICEPT) 10 MG tablet Take 10 mg by mouth at bedtime.    [provider]  doxycycline (VIBRAMYCIN) 100 MG capsule Take 1 capsule (  100 mg) by mouth twice daily for 10 days. 07/09/18   Hinda Kehr, MD  fluticasone (FLONASE) 50 MCG/ACT nasal spray Place 2 sprays into the nose daily. 01/02/13   [provider]  Fluticasone-Salmeterol (ADVAIR DISKUS) 250-50 MCG/DOSE AEPB Inhale 1 puff into the lungs every 12 (twelve) hours. 06/04/15 09/07/17  [provider]  furosemide (LASIX) 80 MG tablet Take 2 tablets by mouth daily. May take additional 80mg  if needed    [provider]  gabapentin (NEURONTIN) 600 MG tablet Take 1 tablet by mouth 3 (three) times daily.    [provider]  hydrOXYzine (ATARAX/VISTARIL) 25 MG tablet  Take 1 tablet by mouth 3 (three) times daily as needed. 01/02/13   [provider]  magnesium oxide (MAG-OX) 400 MG tablet Take 400 mg by mouth daily.    [provider]  metroNIDAZOLE (FLAGYL) 500 MG tablet Take 1 tablet (500 mg total) by mouth 3 (three) times daily. 09/09/17   Epifanio Lesches, MD  omeprazole (PRILOSEC) 40 MG capsule Take 1 capsule by mouth 2 (two) times daily. 02/19/15 09/07/17  [provider]  oxyCODONE-acetaminophen (PERCOCET) 10-325 MG tablet Take 1 tablet by mouth every 6 (six) hours as needed (pt has to take mallinckrodt brand).  10/05/12   [provider]  potassium chloride SA (K-DUR,KLOR-CON) 20 MEQ tablet Take 1 tablet (20 mEq total) by mouth 2 (two) times daily. 09/09/17   Epifanio Lesches, MD  predniSONE (DELTASONE) 1 MG tablet Take 2 mg by mouth daily with breakfast.    [provider]  predniSONE (DELTASONE) 5 MG tablet Take 15 mg by mouth daily with breakfast.    [provider]  promethazine (PHENERGAN) 25 MG tablet Take 25 mg by mouth every 8 (eight) hours as needed for nausea or vomiting.    [provider]  ranitidine (ZANTAC) 150 MG tablet Take 150 mg by mouth 2 (two) times daily.    [provider]  risedronate (ACTONEL) 35 MG tablet Take 35 mg by mouth every 7 (seven) days. with water on empty stomach, nothing by mouth or lie down for next 30 minutes.    [provider]    Allergies Bupropion; Levaquin [levofloxacin in d5w]; Amoxicillin; Codeine; Cymbalta [duloxetine hcl]; Gatifloxacin; Guaifenesin & derivatives; Keflex [cephalexin]; Klonopin [clonazepam]; Macrobid [nitrofurantoin monohyd macro]; Penicillins; Sulfa antibiotics; and Amitriptyline  Family History  Problem Relation Age of Onset  . Ovarian cancer Mother   . Breast cancer Mother   . Brain cancer Mother   . CAD Father   . Diabetes Father   . Hepatitis Father   . Liver disease Father   . Cancer Father   .  Alcohol abuse Father   . Heart attack Brother   . Sleep apnea Brother   . Diabetes Brother   . ALS Paternal Grandmother   . Stroke Maternal Grandmother     Social History Social History   Tobacco Use  . Smoking status: Current Every Day Smoker    Packs/day: 0.50    Types: Cigarettes  . Smokeless tobacco: Never Used  Substance Use Topics  . Alcohol use: Yes    Alcohol/week: 0.0 standard drinks    Comment: rarely  . Drug use: No    Review of Systems Constitutional: General malaise with fever at home. Eyes: No visual changes. ENT: No sore throat. Cardiovascular: Denies chest pain. Respiratory: Frequent productive cough with chest wall tenderness.  No shortness of breath.  Occasional wheezing. Gastrointestinal: No abdominal pain.  No nausea,  no vomiting.  No diarrhea.  No constipation. Genitourinary: Negative for dysuria. Musculoskeletal: Negative for neck pain.  Negative for back pain. Integumentary: Negative for rash. Neurological: Negative for headaches, focal weakness or numbness.   ____________________________________________   PHYSICAL EXAM:  VITAL SIGNS: ED Triage Vitals  Enc Vitals Group     BP 07/09/18 0456 128/81     Pulse Rate 07/09/18 0456 (!) 110     Resp 07/09/18 0456 18     Temp 07/09/18 0456 98.6 F (37 C)     Temp Source 07/09/18 0456 Oral     SpO2 07/09/18 0456 98 %     Weight 07/09/18 0457 78.9 kg (174 lb)     Height 07/09/18 0457 1.626 m (5\' 4" )     Head Circumference --      Peak Flow --      Pain Score 07/09/18 0457 10     Pain Loc --      Pain Edu? --      Excl. in [place]? --     Constitutional: Alert and oriented. Well appearing and in no acute distress. Eyes: Conjunctivae are normal.  Head: Atraumatic. Nose: No congestion/rhinnorhea. Mouth/Throat: Mucous membranes are moist. Neck: No stridor.  No meningeal signs.   Cardiovascular: Tachycardia at triage resolved by the time I saw her with a heart rate in the 90s, regular rhythm. Good  peripheral circulation. Grossly normal heart sounds. Respiratory: Normal respiratory effort.  Frequent minimally productive cough.  Mild end expiratory wheezing throughout lung fields.  No rales or rhonchi. Gastrointestinal: Soft and nontender. No distention.  Musculoskeletal: No lower extremity tenderness nor edema. No gross deformities of extremities. Neurologic:  Normal speech and language. No gross focal neurologic deficits are appreciated.  Skin:  Skin is warm, dry and intact, although she has multiple old bruises, scabs, and prior injuries most notable on her legs and her left arm.  No apparent acute injury.  ____________________________________________   LABS (all labs ordered are listed, but only abnormal results are displayed)  Labs Reviewed - No data to display ____________________________________________  EKG  No indication for EKG ____________________________________________  RADIOLOGY I, Hinda Kehr, personally viewed and evaluated these images (plain radiographs) as part of my medical decision making, as well as reviewing the written report by the radiologist.  ED MD interpretation: No acute infiltrate evidence of broken ribs.  She does have some pulmonary nodules.  Official radiology report(s): Dg Chest 2 View  Result Date: 07/09/2018 CLINICAL DATA:  Cough and wheezing for 3 days. Rib pain. Fever. History of CHF. EXAM: CHEST - 2 VIEW COMPARISON:  Chest radiograph May 15, 2017 and CT chest June 17, 2015 FINDINGS: Cardiomediastinal silhouette is normal. Strandy densities LEFT lung base without pleural effusion focal consolidation. Subcentimeter nodular densities projecting LEFT mid lung zone. Old LEFT rib fractures. IMPRESSION: 1. LEFT lung base atelectasis/scarring. 2. Subcentimeter nodules projecting LEFT lung base. Recommend non emergent contrast enhanced CT chest. Electronically Signed   By: Elon Alas M.D.   On: 07/09/2018 05:42     ____________________________________________   PROCEDURES  Critical Care performed: No   Procedure(s) performed:   Procedures   ____________________________________________   INITIAL IMPRESSION / ASSESSMENT AND PLAN / ED COURSE  As part of my medical decision making, I reviewed the following data within the [place] notes reviewed and incorporated, Old chart reviewed, Radiograph reviewed , Notes from prior ED visits and [place] Controlled Substance Database    Differential diagnosis includes, but is  not limited to, bronchitis/COPD exacerbation, pneumonia, less likely PE or ACS.  The patient's tenderness in her chest is very reproducible with deep breaths and palpation of both sides.  There is no evidence of acute infection on her chest x-ray nor of any acutely broken ribs.  Her vital signs are stable and reassuring.  She has chronic lung disease and a productive cough.  She fill the prescription for clindamycin but I explained to her that that is not an appropriate choice.  As an alternative I am writing her prescription for a Z-Pak.  She has albuterol at home and is already taking steroids for her Cushing's disease which she has already doubled.  She takes chronic pain medicine and benzodiazepines so I am not comfortable writing for a narcotic cough medicine.  She understands the plan to follow-up with her primary care doctor at the next available opportunity.  She will continue using her regular medications including her breathing treatments and steroids and the Z-Pak I provided and she will stop taking the clindamycin.  I gave my usual and customary return precautions.  I also told her in the written instructions to which she can refer back about the pulmonary nodules that she should mention to her primary care doctor for a nonemergent outpatient CT scan of her chest.  Clinical Course as of Jul 09 636  Sat Jul 09, 2018  0637 The patient is refusing the  azithromycin and states that she has taken doxycycline in the past.  She also does not want the Tessalon because she prefers Tussionex but we reiterated that she already takes narcotics and benzodiazepines and I will not prescribe Tussionex.  I switch the empiric antibiotics from azithromycin to doxycycline.   [CF]    Clinical Course User Index [CF] Hinda Kehr, MD    ____________________________________________  FINAL CLINICAL IMPRESSION(S) / ED DIAGNOSES  Final diagnoses:  Bronchitis  Chest wall tenderness  Cough  Pulmonary nodule     MEDICATIONS GIVEN DURING THIS VISIT:  Medications  ipratropium-albuterol (DUONEB) 0.5-2.5 (3) MG/3ML nebulizer solution 3 mL (3 mLs Nebulization Given 07/09/18 0609)     ED Discharge Orders         Ordered    azithromycin (ZITHROMAX) 250 MG tablet  Status:  Discontinued     07/09/18 0558    benzonatate (TESSALON PERLES) 100 MG capsule  3 times daily PRN     07/09/18 0558    doxycycline (VIBRAMYCIN) 100 MG capsule     07/09/18 0636           Note:  This document was prepared using Dragon voice recognition software and may include unintentional dictation errors.    Hinda Kehr, MD 07/09/18 [id number]    Hinda Kehr, MD 07/09/18 [phone]

## 2018-07-09 NOTE — ED Triage Notes (Signed)
Patient wheelchair to triage with complaints of coughing and wheezing for 2 or 3 days.  Intermittent sharp pain in both sides of ribs when pt coughs.  Pt reports chills and fever of 101. Pt reports taking inhalers (without positive effect), steroids and clyndamyacin at 0300 before coming to ED.    Pt reports hx Cushing's disease, COPD, louie body dementia, CHF, and DM. Speaking in complete coherent sentences. No acute breathing distress noted.

## 2018-10-19 DIAGNOSIS — R441 Visual hallucinations: Secondary | ICD-10-CM | POA: Insufficient documentation

## 2018-12-21 ENCOUNTER — Emergency Department
Admission: EM | Admit: 2018-12-21 | Discharge: 2018-12-22 | Disposition: A | Discharge status: Home / Self Care | Payer: Medicare Other | Attending: Emergency Medicine | Admitting: Emergency Medicine

## 2018-12-21 ENCOUNTER — Emergency Department: Payer: Medicare Other

## 2018-12-21 ENCOUNTER — Other Ambulatory Visit: Payer: Self-pay

## 2018-12-21 ENCOUNTER — Encounter: Payer: Self-pay | Admitting: Emergency Medicine

## 2018-12-21 DIAGNOSIS — F17210 Nicotine dependence, cigarettes, uncomplicated: Secondary | ICD-10-CM | POA: Diagnosis not present

## 2018-12-21 DIAGNOSIS — I509 Heart failure, unspecified: Secondary | ICD-10-CM | POA: Insufficient documentation

## 2018-12-21 DIAGNOSIS — J449 Chronic obstructive pulmonary disease, unspecified: Secondary | ICD-10-CM | POA: Diagnosis not present

## 2018-12-21 DIAGNOSIS — M545 Low back pain, unspecified: Secondary | ICD-10-CM

## 2018-12-21 DIAGNOSIS — W19XXXA Unspecified fall, initial encounter: Secondary | ICD-10-CM

## 2018-12-21 DIAGNOSIS — G3183 Dementia with Lewy bodies: Secondary | ICD-10-CM | POA: Insufficient documentation

## 2018-12-21 DIAGNOSIS — I2510 Atherosclerotic heart disease of native coronary artery without angina pectoris: Secondary | ICD-10-CM | POA: Insufficient documentation

## 2018-12-21 DIAGNOSIS — Z79899 Other long term (current) drug therapy: Secondary | ICD-10-CM | POA: Diagnosis not present

## 2018-12-21 DIAGNOSIS — F0390 Unspecified dementia without behavioral disturbance: Secondary | ICD-10-CM | POA: Diagnosis not present

## 2018-12-21 LAB — URINALYSIS, COMPLETE (UACMP) WITH MICROSCOPIC
Bacteria, UA: NONE SEEN
Bilirubin Urine: NEGATIVE
Glucose, UA: NEGATIVE mg/dL
Ketones, ur: NEGATIVE mg/dL
Leukocytes,Ua: NEGATIVE
Nitrite: NEGATIVE
Protein, ur: NEGATIVE mg/dL
Specific Gravity, Urine: 1.004 — ABNORMAL LOW (ref 1.005–1.030)
pH: 7 (ref 5.0–8.0)

## 2018-12-21 LAB — CBC
HCT: 41.3 % (ref 36.0–46.0)
Hemoglobin: 13.7 g/dL (ref 12.0–15.0)
MCH: 30.1 pg (ref 26.0–34.0)
MCHC: 33.2 g/dL (ref 30.0–36.0)
MCV: 90.8 fL (ref 80.0–100.0)
Platelets: 318 10*3/uL (ref 150–400)
RBC: 4.55 MIL/uL (ref 3.87–5.11)
RDW: 13.1 % (ref 11.5–15.5)
WBC: 15.1 10*3/uL — ABNORMAL HIGH (ref 4.0–10.5)
nRBC: 0 % (ref 0.0–0.2)

## 2018-12-21 LAB — BASIC METABOLIC PANEL
Anion gap: 20 — ABNORMAL HIGH (ref 5–15)
BUN: 13 mg/dL (ref 6–20)
CO2: 24 mmol/L (ref 22–32)
Calcium: 8.9 mg/dL (ref 8.9–10.3)
Chloride: 91 mmol/L — ABNORMAL LOW (ref 98–111)
Creatinine, Ser: 0.84 mg/dL (ref 0.44–1.00)
GFR calc Af Amer: >60 mL/min (ref >60)
GFR calc non Af Amer: >60 mL/min (ref >60)
Glucose, Bld: 169 mg/dL — ABNORMAL HIGH (ref 70–99)
Potassium: 2.9 mmol/L — ABNORMAL LOW (ref 3.5–5.1)
Sodium: 135 mmol/L (ref 135–145)

## 2018-12-21 LAB — GLUCOSE, CAPILLARY: Glucose-Capillary: 165 mg/dL — ABNORMAL HIGH (ref 70–99)

## 2018-12-21 MED ORDER — SODIUM CHLORIDE 0.9% FLUSH: 3.0000 mL | Freq: Once | INTRAVENOUS | Status: DC

## 2018-12-21 MED ORDER — POTASSIUM CHLORIDE CRYS ER 20 MEQ PO TBCR
40.0000 meq | EXTENDED_RELEASE_TABLET | Freq: Once | ORAL | Status: AC
  Administered 2018-12-21: 40 meq via ORAL
  Filled 2018-12-21: qty 2

## 2018-12-21 NOTE — ED Triage Notes (Signed)
First RN Note: Pt presents to ED via POV with c/o multiple falls. Pt reports that her equilibrium is off and that hs been causing her to fall. Pt reports hitting her head multiple times when falling. Pt is alert and oriented and able to answer questions at this time.

## 2018-12-21 NOTE — ED Triage Notes (Addendum)
Pt c/o increased falls at home, states she hit her head with no LOC. Denies any anticoag use. Pt has skin tears noted throughout body. Pt hx of Lewybody dementia. PT A&OX4, lives at home alone. PT c/o back pain. VSS

## 2018-12-21 NOTE — ED Provider Notes (Addendum)
[facility] Emergency Department Provider Note   ____________________________________________   I have reviewed the triage vital signs and the nursing notes.   HISTORY  Chief Complaint Falls  History limited by: Not Limited   HPI [name] is a 58 y.o. female who presents to the emergency department today because of concern for falls. The patient states that she started falling roughly 3-4 days ago. She has had multiple falls. She feels like her left leg will go out from under her. She has noticed some noises coming from her left knee. The patient says that she has hit her head with the falls. Is also complaining of some low back pain. She also is complaining of shortness of breath but says that has been present for a long time. She had fevers last week.   Records reviewed. Per medical record review patient has a history of COPD, CHF, dementia.   Past Medical History:  Diagnosis Date  . Adrenal insufficiency ([place])   . Anxiety   . CAD (coronary artery disease)   . Cancer ([place])    skin  . Chronic diastolic CHF (congestive heart failure) ([place])   . Chronic pain   . COPD (chronic obstructive pulmonary disease) ([place])   . Cushing disease ([place])   . Dementia ([place])   . GERD (gastroesophageal reflux disease)   . HLD (hyperlipidemia)     Patient Active Problem List   Diagnosis Date Noted  . Abdominal pain 09/07/2017  . Diarrhea   . Chest pain 06/17/2015  . Elevated troponin 06/17/2015  . Fracture, ribs 06/17/2015  . GERD (gastroesophageal reflux disease) 06/17/2015  . COPD (chronic obstructive pulmonary disease) ([place]) 06/17/2015  . Chronic pain 06/17/2015  . Anxiety 06/17/2015  . Adrenal insufficiency ([place]) 06/17/2015    Past Surgical History:  Procedure Laterality Date  . APPENDECTOMY    . NASAL SEPTUM SURGERY    . NASAL SINUS SURGERY    . UMBILICAL HERNIA REPAIR    . VAGINAL HYSTERECTOMY      Prior to Admission medications   Medication Sig  Start Date End Date Taking? Authorizing Provider  albuterol (PROAIR HFA) 108 (90 BASE) MCG/ACT inhaler Inhale 2 puffs into the lungs every 6 (six) hours as needed. 06/04/15   [provider]  ALPRAZolam Duanne Moron) 1 MG tablet Take 1 tablet by mouth 4 (four) times daily.    [provider]  benzonatate (TESSALON PERLES) 100 MG capsule Take 1 capsule (100 mg total) by mouth 3 (three) times daily as needed for cough. 07/09/18   Hinda Kehr, MD  Calcium Carbonate-Vitamin D 600-400 MG-UNIT tablet Take 1 tablet by mouth daily. 05/22/15 09/07/17  [provider]  cyclobenzaprine (FLEXERIL) 10 MG tablet Take 1 tablet by mouth 3 (three) times daily as needed.    [provider]  donepezil (ARICEPT) 10 MG tablet Take 10 mg by mouth at bedtime.    [provider]  doxycycline (VIBRAMYCIN) 100 MG capsule Take 1 capsule (100 mg) by mouth twice daily for 10 days. 07/09/18   Hinda Kehr, MD  fluticasone (FLONASE) 50 MCG/ACT nasal spray Place 2 sprays into the nose daily. 01/02/13   [provider]  Fluticasone-Salmeterol (ADVAIR DISKUS) 250-50 MCG/DOSE AEPB Inhale 1 puff into the lungs every 12 (twelve) hours. 06/04/15 09/07/17  [provider]  furosemide (LASIX) 80 MG tablet Take 2 tablets by mouth daily. May take additional 80mg  if needed    [provider]  gabapentin (NEURONTIN) 600 MG tablet  Take 1 tablet by mouth 3 (three) times daily.    [provider]  hydrOXYzine (ATARAX/VISTARIL) 25 MG tablet Take 1 tablet by mouth 3 (three) times daily as needed. 01/02/13   [provider]  magnesium oxide (MAG-OX) 400 MG tablet Take 400 mg by mouth daily.    [provider]  metroNIDAZOLE (FLAGYL) 500 MG tablet Take 1 tablet (500 mg total) by mouth 3 (three) times daily. 09/09/17   Epifanio Lesches, MD  omeprazole (PRILOSEC) 40 MG capsule Take 1 capsule by mouth 2 (two) times daily. 02/19/15 09/07/17  [provider]   oxyCODONE-acetaminophen (PERCOCET) 10-325 MG tablet Take 1 tablet by mouth every 6 (six) hours as needed (pt has to take [facility] brand).  10/05/12   [provider]  potassium chloride SA (K-DUR,KLOR-CON) 20 MEQ tablet Take 1 tablet (20 mEq total) by mouth 2 (two) times daily. 09/09/17   Epifanio Lesches, MD  predniSONE (DELTASONE) 1 MG tablet Take 2 mg by mouth daily with breakfast.    [provider]  predniSONE (DELTASONE) 5 MG tablet Take 15 mg by mouth daily with breakfast.    [provider]  promethazine (PHENERGAN) 25 MG tablet Take 25 mg by mouth every 8 (eight) hours as needed for nausea or vomiting.    [provider]  ranitidine (ZANTAC) 150 MG tablet Take 150 mg by mouth 2 (two) times daily.    [provider]  risedronate (ACTONEL) 35 MG tablet Take 35 mg by mouth every 7 (seven) days. with water on empty stomach, nothing by mouth or lie down for next 30 minutes.    [provider]    Allergies Bupropion; Levaquin [levofloxacin in d5w]; Amoxicillin; Codeine; Cymbalta [duloxetine hcl]; Gatifloxacin; Guaifenesin & derivatives; Keflex [cephalexin]; Klonopin [clonazepam]; Macrobid [nitrofurantoin monohyd macro]; Penicillins; Sulfa antibiotics; and Amitriptyline  Family History  Problem Relation Age of Onset  . Ovarian cancer Mother   . Breast cancer Mother   . Brain cancer Mother   . CAD Father   . Diabetes Father   . Hepatitis Father   . Liver disease Father   . Cancer Father   . Alcohol abuse Father   . Heart attack Brother   . Sleep apnea Brother   . Diabetes Brother   . ALS Paternal Grandmother   . Stroke Maternal Grandmother     Social History Social History   Tobacco Use  . Smoking status: Current Every Day Smoker    Packs/day: 0.50    Types: Cigarettes  . Smokeless tobacco: Never Used  Substance Use Topics  . Alcohol use: Yes    Alcohol/week: 0.0 standard drinks    Comment: rarely  . Drug use: No     Review of Systems Constitutional: Positive for fever last week.  Eyes: No visual changes. ENT: No sore throat. Cardiovascular: Denies chest pain. Respiratory: Positive for shortness of breath. Gastrointestinal: No abdominal pain.  No nausea, no vomiting.  No diarrhea.   Genitourinary: Negative for dysuria. Musculoskeletal: Positive for low back pain.  Skin: Negative for rash. Neurological: Negative for headaches, focal weakness or numbness.  ____________________________________________   PHYSICAL EXAM:  VITAL SIGNS: ED Triage Vitals  Enc Vitals Group     BP 12/21/18 1333 127/69     Pulse Rate 12/21/18 1333 (!) 104     Resp 12/21/18 1333 16     Temp 12/21/18 1335 98.5 F (36.9 C)     Temp Source 12/21/18 1333 Oral     SpO2 12/21/18  1333 95 %     Weight --      Height --      Head Circumference --      Peak Flow --      Pain Score 12/21/18 1333 8   Constitutional: Alert and oriented.  Eyes: Conjunctivae are normal.  ENT      Head: Normocephalic and atraumatic.      Nose: No congestion/rhinnorhea.      Mouth/Throat: Mucous membranes are moist.      Neck: No stridor. Hematological/Lymphatic/Immunilogical: No cervical lymphadenopathy. Cardiovascular: Normal rate, regular rhythm.  No murmurs, rubs, or gallops.  Respiratory: Normal respiratory effort without tachypnea nor retractions. Breath sounds are clear and equal bilaterally. No wheezes/rales/rhonchi. Gastrointestinal: Soft and non tender. No rebound. No guarding.  Genitourinary: Deferred Musculoskeletal: Normal range of motion in all extremities. No lower extremity edema. Neurologic:  Normal speech and language. No gross focal neurologic deficits are appreciated.  Skin:  Superficial wounds to bilateral shins.  Psychiatric: Mood and affect are normal. Speech and behavior are normal. Patient exhibits appropriate insight and judgment.  ____________________________________________    LABS (pertinent  positives/negatives)  BMP wnl except k 2.9, cl 91, glu 169, anion gap 20 CBC wbc 15.1, hgb 13.7, plt 318 UA clear, small hgb dipstick, 0-5 rbc and wbc  ____________________________________________   EKG  I, Nance Pear, attending physician, personally viewed and interpreted this EKG  EKG Time: 1342 Rate: 106 Rhythm: sinus tachycardia Axis: normal Intervals: qtc 669 QRS: narrow ST changes: no st elevation Impression: abnormal ekg   ____________________________________________    RADIOLOGY  MR lumbar spine No spinal cord compression  MR brain No acute stroke  CT brain Normal brain parenchyma  Lumbar spine NO acute osseous injury   ____________________________________________   PROCEDURES  Procedures  ____________________________________________   INITIAL IMPRESSION / ASSESSMENT AND PLAN / ED COURSE  Pertinent labs & imaging results that were available during my care of the patient were reviewed by me and considered in my medical decision making (see chart for details).   Patient presented to the emergency department today because of concern for increased falls. Also complaining of some lower back pain. Significant work up was obtained without any concerning acute findings. Did offer to have patient be seen by social work given concern for falls however patient declined and stated she felt comfortable going home. At this point I think that is reasonable.   Prior to discharge nurse stated that patient had a couple more questions for me. I did attempt to answer the patient's questions to the best of my ability. Patient expressed some frustration that I did not find the etiology of her symptoms. Discussed with patient that work up looking for many of the concerning causes of her symptoms were performed. Again went over the fact that there was no spinal cord compression, no concerning MRI findings.  Discussed with patient that she would benefit from continued  follow up with primary care physician. At this point she stated that he would not tell her what is going on. Discussed with patient that she could find a new PCP if she is upset with his care. She then expressed frustration at myself for not finding the source. Patient then had no further questions for myself.   ____________________________________________   FINAL CLINICAL IMPRESSION(S) / ED DIAGNOSES  Final diagnoses:  Fall, initial encounter  Low back pain, unspecified back pain laterality, unspecified chronicity, unspecified whether sciatica present     Note: This dictation was  prepared with [employer] . Any transcriptional errors that result from this process are unintentional     Nance Pear, MD 12/21/18 2254    Nance Pear, MD 12/21/18 2330

## 2018-12-21 NOTE — ED Notes (Signed)
Screening for MRI complete

## 2018-12-21 NOTE — Discharge Instructions (Addendum)
Please seek medical attention for any high fevers, chest pain, shortness of breath, change in behavior, persistent vomiting, bloody stool or any other new or concerning symptoms.  

## 2018-12-21 NOTE — ED Notes (Signed)
Pt given food tray and a cup of ginger ale by this tech. MD notified.

## 2018-12-21 NOTE — ED Notes (Signed)
Spoke with Dr. Ellender Hose about pt presentation, verbal order for CT head

## 2018-12-22 NOTE — ED Notes (Signed)
Pt attempted to contact several people to come pick her up but was unable to get anyone to come. Charge nurse notified and cab voucher issued.

## 2019-01-13 ENCOUNTER — Other Ambulatory Visit: Payer: Self-pay | Admitting: Orthopedic Surgery

## 2019-01-13 DIAGNOSIS — G8929 Other chronic pain: Secondary | ICD-10-CM

## 2019-03-28 DIAGNOSIS — M50121 Cervical disc disorder at C4-C5 level with radiculopathy: Secondary | ICD-10-CM | POA: Insufficient documentation

## 2019-03-28 DIAGNOSIS — M87059 Idiopathic aseptic necrosis of unspecified femur: Secondary | ICD-10-CM | POA: Insufficient documentation

## 2019-09-12 ENCOUNTER — Emergency Department: Payer: Medicare Other

## 2019-09-12 ENCOUNTER — Encounter: Payer: Self-pay | Admitting: Emergency Medicine

## 2019-09-12 ENCOUNTER — Inpatient Hospital Stay
Admission: EM | Admit: 2019-09-12 | Discharge: 2019-09-14 | DRG: 641 | Disposition: A | Discharge status: Home Health | Payer: Medicare Other | Attending: Family Medicine | Admitting: Family Medicine

## 2019-09-12 ENCOUNTER — Other Ambulatory Visit: Payer: Self-pay

## 2019-09-12 DIAGNOSIS — J449 Chronic obstructive pulmonary disease, unspecified: Secondary | ICD-10-CM | POA: Diagnosis present

## 2019-09-12 DIAGNOSIS — Z885 Allergy status to narcotic agent status: Secondary | ICD-10-CM

## 2019-09-12 DIAGNOSIS — G8929 Other chronic pain: Secondary | ICD-10-CM | POA: Diagnosis present

## 2019-09-12 DIAGNOSIS — E2749 Other adrenocortical insufficiency: Secondary | ICD-10-CM | POA: Diagnosis present

## 2019-09-12 DIAGNOSIS — E860 Dehydration: Secondary | ICD-10-CM

## 2019-09-12 DIAGNOSIS — F0390 Unspecified dementia without behavioral disturbance: Secondary | ICD-10-CM | POA: Diagnosis present

## 2019-09-12 DIAGNOSIS — F39 Unspecified mood [affective] disorder: Secondary | ICD-10-CM | POA: Diagnosis present

## 2019-09-12 DIAGNOSIS — F419 Anxiety disorder, unspecified: Secondary | ICD-10-CM | POA: Diagnosis present

## 2019-09-12 DIAGNOSIS — B349 Viral infection, unspecified: Secondary | ICD-10-CM | POA: Diagnosis present

## 2019-09-12 DIAGNOSIS — R112 Nausea with vomiting, unspecified: Secondary | ICD-10-CM | POA: Diagnosis not present

## 2019-09-12 DIAGNOSIS — Z881 Allergy status to other antibiotic agents status: Secondary | ICD-10-CM

## 2019-09-12 DIAGNOSIS — Z880 Allergy status to penicillin: Secondary | ICD-10-CM

## 2019-09-12 DIAGNOSIS — I5032 Chronic diastolic (congestive) heart failure: Secondary | ICD-10-CM

## 2019-09-12 DIAGNOSIS — R1110 Vomiting, unspecified: Secondary | ICD-10-CM | POA: Diagnosis present

## 2019-09-12 DIAGNOSIS — Z882 Allergy status to sulfonamides status: Secondary | ICD-10-CM

## 2019-09-12 DIAGNOSIS — E785 Hyperlipidemia, unspecified: Secondary | ICD-10-CM | POA: Diagnosis present

## 2019-09-12 DIAGNOSIS — R42 Dizziness and giddiness: Secondary | ICD-10-CM

## 2019-09-12 DIAGNOSIS — Z20822 Contact with and (suspected) exposure to covid-19: Secondary | ICD-10-CM | POA: Diagnosis present

## 2019-09-12 DIAGNOSIS — N179 Acute kidney failure, unspecified: Secondary | ICD-10-CM | POA: Diagnosis present

## 2019-09-12 DIAGNOSIS — E2740 Unspecified adrenocortical insufficiency: Secondary | ICD-10-CM | POA: Diagnosis present

## 2019-09-12 DIAGNOSIS — Z79899 Other long term (current) drug therapy: Secondary | ICD-10-CM

## 2019-09-12 DIAGNOSIS — Z8249 Family history of ischemic heart disease and other diseases of the circulatory system: Secondary | ICD-10-CM

## 2019-09-12 DIAGNOSIS — I2510 Atherosclerotic heart disease of native coronary artery without angina pectoris: Secondary | ICD-10-CM | POA: Diagnosis present

## 2019-09-12 DIAGNOSIS — Z888 Allergy status to other drugs, medicaments and biological substances status: Secondary | ICD-10-CM

## 2019-09-12 DIAGNOSIS — E876 Hypokalemia: Secondary | ICD-10-CM | POA: Diagnosis present

## 2019-09-12 DIAGNOSIS — Z85828 Personal history of other malignant neoplasm of skin: Secondary | ICD-10-CM

## 2019-09-12 DIAGNOSIS — F17210 Nicotine dependence, cigarettes, uncomplicated: Secondary | ICD-10-CM | POA: Diagnosis present

## 2019-09-12 DIAGNOSIS — K219 Gastro-esophageal reflux disease without esophagitis: Secondary | ICD-10-CM | POA: Diagnosis present

## 2019-09-12 DIAGNOSIS — Z7952 Long term (current) use of systemic steroids: Secondary | ICD-10-CM

## 2019-09-12 LAB — URINALYSIS, COMPLETE (UACMP) WITH MICROSCOPIC
Bacteria, UA: NONE SEEN
Bilirubin Urine: NEGATIVE
Glucose, UA: NEGATIVE mg/dL
Ketones, ur: 5 mg/dL — AB
Leukocytes,Ua: NEGATIVE
Nitrite: NEGATIVE
Protein, ur: NEGATIVE mg/dL
Specific Gravity, Urine: 1.005 (ref 1.005–1.030)
pH: 7 (ref 5.0–8.0)

## 2019-09-12 LAB — BASIC METABOLIC PANEL
Anion gap: 15 (ref 5–15)
BUN: 22 mg/dL — ABNORMAL HIGH (ref 6–20)
CO2: 28 mmol/L (ref 22–32)
Calcium: 8.9 mg/dL (ref 8.9–10.3)
Chloride: 95 mmol/L — ABNORMAL LOW (ref 98–111)
Creatinine, Ser: 1.04 mg/dL — ABNORMAL HIGH (ref 0.44–1.00)
GFR calc Af Amer: >60 mL/min (ref >60)
GFR calc non Af Amer: 60 mL/min — ABNORMAL LOW (ref >60)
Glucose, Bld: 120 mg/dL — ABNORMAL HIGH (ref 70–99)
Potassium: 2.9 mmol/L — ABNORMAL LOW (ref 3.5–5.1)
Sodium: 138 mmol/L (ref 135–145)

## 2019-09-12 LAB — CBC
HCT: 45.2 % (ref 36.0–46.0)
Hemoglobin: 14.9 g/dL (ref 12.0–15.0)
MCH: 29.6 pg (ref 26.0–34.0)
MCHC: 33 g/dL (ref 30.0–36.0)
MCV: 89.7 fL (ref 80.0–100.0)
Platelets: 356 10*3/uL (ref 150–400)
RBC: 5.04 MIL/uL (ref 3.87–5.11)
RDW: 13.5 % (ref 11.5–15.5)
WBC: 17.3 10*3/uL — ABNORMAL HIGH (ref 4.0–10.5)
nRBC: 0 % (ref 0.0–0.2)

## 2019-09-12 LAB — COMPREHENSIVE METABOLIC PANEL
ALT: 23 U/L (ref 0–44)
AST: 23 U/L (ref 15–41)
Albumin: 4.2 g/dL (ref 3.5–5.0)
Alkaline Phosphatase: 98 U/L (ref 38–126)
Anion gap: 17 — ABNORMAL HIGH (ref 5–15)
BUN: 25 mg/dL — ABNORMAL HIGH (ref 6–20)
CO2: 31 mmol/L (ref 22–32)
Calcium: 9.9 mg/dL (ref 8.9–10.3)
Chloride: 89 mmol/L — ABNORMAL LOW (ref 98–111)
Creatinine, Ser: 1.15 mg/dL — ABNORMAL HIGH (ref 0.44–1.00)
GFR calc Af Amer: >60 mL/min (ref >60)
GFR calc non Af Amer: 53 mL/min — ABNORMAL LOW (ref >60)
Glucose, Bld: 253 mg/dL — ABNORMAL HIGH (ref 70–99)
Potassium: 3 mmol/L — ABNORMAL LOW (ref 3.5–5.1)
Sodium: 137 mmol/L (ref 135–145)
Total Bilirubin: 1.8 mg/dL — ABNORMAL HIGH (ref 0.3–1.2)
Total Protein: 8.1 g/dL (ref 6.5–8.1)

## 2019-09-12 LAB — LIPASE, BLOOD: Lipase: 18 U/L (ref 11–51)

## 2019-09-12 MED ORDER — LORAZEPAM 2 MG/ML IJ SOLN
0.5000 mg | Freq: Four times a day (QID) | INTRAMUSCULAR | Status: DC | PRN
  Administered 2019-09-13: 0.5 mg via INTRAVENOUS
  Filled 2019-09-12: qty 1

## 2019-09-12 MED ORDER — ONDANSETRON HCL 4 MG/2ML IJ SOLN
4.0000 mg | Freq: Once | INTRAMUSCULAR | Status: AC
  Administered 2019-09-12: 16:00:00 4 mg via INTRAVENOUS
  Filled 2019-09-12: qty 2

## 2019-09-12 MED ORDER — HYDROCORTISONE NA SUCCINATE PF 100 MG IJ SOLR
100.0000 mg | Freq: Four times a day (QID) | INTRAMUSCULAR | Status: DC
  Administered 2019-09-13 (×2): 100 mg via INTRAVENOUS
  Filled 2019-09-12 (×2): qty 2

## 2019-09-12 MED ORDER — LACTATED RINGERS IV BOLUS
1000.0000 mL | Freq: Once | INTRAVENOUS | Status: AC
  Administered 2019-09-12: 1000 mL via INTRAVENOUS

## 2019-09-12 MED ORDER — ONDANSETRON HCL 4 MG PO TABS
4.0000 mg | ORAL_TABLET | Freq: Four times a day (QID) | ORAL | Status: DC | PRN
  Administered 2019-09-14: 4 mg via ORAL
  Filled 2019-09-12: qty 1

## 2019-09-12 MED ORDER — SODIUM CHLORIDE 0.9% FLUSH
3.0000 mL | Freq: Once | INTRAVENOUS | Status: AC
  Administered 2019-09-12: 3 mL via INTRAVENOUS

## 2019-09-12 MED ORDER — IOHEXOL 300 MG/ML  SOLN
100.0000 mL | Freq: Once | INTRAMUSCULAR | Status: AC | PRN
  Administered 2019-09-12: 100 mL via INTRAVENOUS

## 2019-09-12 MED ORDER — HYDROMORPHONE HCL 1 MG/ML IJ SOLN
0.5000 mg | Freq: Once | INTRAMUSCULAR | Status: AC
  Administered 2019-09-12: 0.5 mg via INTRAVENOUS
  Filled 2019-09-12: qty 1

## 2019-09-12 MED ORDER — ACETAMINOPHEN 650 MG RE SUPP: 650.0000 mg | Freq: Four times a day (QID) | RECTAL | Status: DC | PRN

## 2019-09-12 MED ORDER — ENOXAPARIN SODIUM 40 MG/0.4ML ~~LOC~~ SOLN
40.0000 mg | SUBCUTANEOUS | Status: DC
  Administered 2019-09-13 (×2): 40 mg via SUBCUTANEOUS
  Filled 2019-09-12 (×2): qty 0.4

## 2019-09-12 MED ORDER — POTASSIUM CHLORIDE IN NACL 40-0.9 MEQ/L-% IV SOLN
INTRAVENOUS | Status: DC
  Administered 2019-09-13 – 2019-09-14 (×3): 75 mL/h via INTRAVENOUS
  Filled 2019-09-12 (×4): qty 1000

## 2019-09-12 MED ORDER — ACETAMINOPHEN 325 MG PO TABS: 650.0000 mg | ORAL_TABLET | Freq: Four times a day (QID) | ORAL | Status: DC | PRN

## 2019-09-12 MED ORDER — LORAZEPAM 2 MG/ML IJ SOLN
1.0000 mg | Freq: Once | INTRAMUSCULAR | Status: AC
  Administered 2019-09-12: 1 mg via INTRAVENOUS
  Filled 2019-09-12: qty 1

## 2019-09-12 MED ORDER — SODIUM CHLORIDE 0.9 % IV BOLUS
1000.0000 mL | Freq: Once | INTRAVENOUS | Status: AC
  Administered 2019-09-12: 1000 mL via INTRAVENOUS

## 2019-09-12 MED ORDER — PROCHLORPERAZINE EDISYLATE 10 MG/2ML IJ SOLN
5.0000 mg | INTRAMUSCULAR | Status: DC | PRN
  Filled 2019-09-12: qty 1

## 2019-09-12 MED ORDER — ONDANSETRON HCL 4 MG/2ML IJ SOLN
4.0000 mg | Freq: Four times a day (QID) | INTRAMUSCULAR | Status: DC | PRN
  Administered 2019-09-14: 4 mg via INTRAVENOUS
  Filled 2019-09-12: qty 2

## 2019-09-12 MED ORDER — PROMETHAZINE HCL 25 MG/ML IJ SOLN
12.5000 mg | Freq: Once | INTRAMUSCULAR | Status: AC
  Administered 2019-09-12: 12.5 mg via INTRAVENOUS

## 2019-09-12 MED ORDER — MECLIZINE HCL 25 MG PO TABS
25.0000 mg | ORAL_TABLET | Freq: Once | ORAL | Status: AC
  Administered 2019-09-12: 25 mg via ORAL
  Filled 2019-09-12: qty 1

## 2019-09-12 MED ORDER — ONDANSETRON HCL 4 MG/2ML IJ SOLN
4.0000 mg | Freq: Once | INTRAMUSCULAR | Status: AC
  Administered 2019-09-12: 4 mg via INTRAVENOUS
  Filled 2019-09-12: qty 2

## 2019-09-12 NOTE — ED Notes (Signed)
Medications administered per EDP prder, pt moved to subwait receiving fluids at this time, call bell within reach.

## 2019-09-12 NOTE — ED Provider Notes (Signed)
[facility] Emergency Department Provider Note  Time seen: 4:14 PM  I have reviewed the triage vital signs and the nursing notes.   HISTORY  Chief Complaint Emesis   HPI [name] is a 59 y.o. female with a past medical history of anxiety, CAD, CHF, COPD, gastric reflux, hyperlipidemia, chronic pain, presents to the emergency department for nausea and vomiting as well as abdominal pain.  According to the patient for the past 2 days she has been experiencing fairly frequent nausea and now with intermittent vomiting worse over the past 6 to 12 hours.  Patient states she has been experiencing upper abdominal pain as well she is not sure if this is due to the vomiting.  She also describes a headache which she relates to the vomiting.  Patient takes Percocet for chronic pain but has not been able to take over the past 24 hours due to her nausea vomiting.  Patient describes her nausea as a sensation of dizziness, states if she holds her head still and looks out in front of her she feels like her eyes are moving or the room is spinning which makes her very nauseated and then the vomiting begins.  Patient is currently actively vomiting in the room.  Denies any history of vertigo.  Denies any recent illnesses such as cough or fever.   Past Medical History:  Diagnosis Date  . Adrenal insufficiency ([place])   . Anxiety   . CAD (coronary artery disease)   . Cancer ([place])    skin  . Chronic diastolic CHF (congestive heart failure) ([place])   . Chronic pain   . COPD (chronic obstructive pulmonary disease) ([place])   . Cushing disease ([place])   . Dementia ([place])   . GERD (gastroesophageal reflux disease)   . HLD (hyperlipidemia)     Patient Active Problem List   Diagnosis Date Noted  . Abdominal pain 09/07/2017  . Diarrhea   . Chest pain 06/17/2015  . Elevated troponin 06/17/2015  . Fracture, ribs 06/17/2015  . GERD (gastroesophageal reflux disease) 06/17/2015  . COPD (chronic  obstructive pulmonary disease) ([place]) 06/17/2015  . Chronic pain 06/17/2015  . Anxiety 06/17/2015  . Adrenal insufficiency ([place]) 06/17/2015    Past Surgical History:  Procedure Laterality Date  . APPENDECTOMY    . NASAL SEPTUM SURGERY    . NASAL SINUS SURGERY    . UMBILICAL HERNIA REPAIR    . VAGINAL HYSTERECTOMY      Prior to Admission medications   Medication Sig Start Date End Date Taking? Authorizing Provider  albuterol (PROAIR HFA) 108 (90 BASE) MCG/ACT inhaler Inhale 2 puffs into the lungs every 6 (six) hours as needed. 06/04/15   [provider]  ALPRAZolam Duanne Moron) 1 MG tablet Take 1 tablet by mouth 4 (four) times daily.    [provider]  benzonatate (TESSALON PERLES) 100 MG capsule Take 1 capsule (100 mg total) by mouth 3 (three) times daily as needed for cough. 07/09/18   Hinda Kehr, MD  Calcium Carbonate-Vitamin D 600-400 MG-UNIT tablet Take 1 tablet by mouth daily. 05/22/15 09/07/17  [provider]  cyclobenzaprine (FLEXERIL) 10 MG tablet Take 1 tablet by mouth 3 (three) times daily as needed.    [provider]  donepezil (ARICEPT) 10 MG tablet Take 10 mg by mouth at bedtime.    [provider]  doxycycline (VIBRAMYCIN) 100 MG capsule Take 1 capsule (100 mg) by mouth twice daily for 10 days. 07/09/18   Karma Greaser,  Tommi Rumps, MD  fluticasone [facility]) 50 MCG/ACT nasal spray Place 2 sprays into the nose daily. 01/02/13   [provider]  Fluticasone-Salmeterol (ADVAIR DISKUS) 250-50 MCG/DOSE AEPB Inhale 1 puff into the lungs every 12 (twelve) hours. 06/04/15 09/07/17  [provider]  furosemide (LASIX) 80 MG tablet Take 2 tablets by mouth daily. May take additional 80mg  if needed    [provider]  gabapentin (NEURONTIN) 600 MG tablet Take 1 tablet by mouth 3 (three) times daily.    [provider]  hydrOXYzine (ATARAX/VISTARIL) 25 MG tablet Take 1 tablet by mouth 3 (three) times daily as needed. 01/02/13    [provider]  magnesium oxide (MAG-OX) 400 MG tablet Take 400 mg by mouth daily.    [provider]  metroNIDAZOLE (FLAGYL) 500 MG tablet Take 1 tablet (500 mg total) by mouth 3 (three) times daily. 09/09/17   Epifanio Lesches, MD  omeprazole (PRILOSEC) 40 MG capsule Take 1 capsule by mouth 2 (two) times daily. 02/19/15 09/07/17  [provider]  oxyCODONE-acetaminophen (PERCOCET) 10-325 MG tablet Take 1 tablet by mouth every 6 (six) hours as needed (pt has to take [facility] brand).  10/05/12   [provider]  potassium chloride SA (K-DUR,KLOR-CON) 20 MEQ tablet Take 1 tablet (20 mEq total) by mouth 2 (two) times daily. 09/09/17   Epifanio Lesches, MD  predniSONE (DELTASONE) 1 MG tablet Take 2 mg by mouth daily with breakfast.    [provider]  predniSONE (DELTASONE) 5 MG tablet Take 15 mg by mouth daily with breakfast.    [provider]  promethazine (PHENERGAN) 25 MG tablet Take 25 mg by mouth every 8 (eight) hours as needed for nausea or vomiting.    [provider]  ranitidine (ZANTAC) 150 MG tablet Take 150 mg by mouth 2 (two) times daily.    [provider]  risedronate (ACTONEL) 35 MG tablet Take 35 mg by mouth every 7 (seven) days. with water on empty stomach, nothing by mouth or lie down for next 30 minutes.    [provider]    Allergies  Allergen Reactions  . Bupropion Other (See Comments), Nausea Only, Shortness Of Breath and Nausea And Vomiting    cold sweats, extended release, only can take [facility] brand Issues with extended release medications.  [place] [Levofloxacin In D5w] Anaphylaxis  . Amoxicillin Itching  . Codeine Nausea And Vomiting  . Cymbalta [Duloxetine Hcl] Swelling  . Gatifloxacin Hives and Swelling    Other Reaction: Other reaction: Swelling  . Guaifenesin & Derivatives Hives and Swelling  . Keflex [Cephalexin] Other (See Comments)    Unknown  . Klonopin  [Clonazepam] Other (See Comments)    "Makes me have bad thoughts"  . Macrobid [employer] Macro] Other (See Comments)    "kidneys shuts down" Chills, Fever.  . Penicillins Hives    Has patient had a PCN reaction causing immediate rash, facial/tongue/throat swelling, SOB or lightheadedness with hypotension: Yes Has patient had a PCN reaction causing severe rash involving mucus membranes or skin necrosis: No Has patient had a PCN reaction that required hospitalization: No Has patient had a PCN reaction occurring within the last 10 years: No If all of the above answers are "NO", then may proceed with Cephalosporin use.  . Sulfa Antibiotics Hives  . Amitriptyline Rash    "It makes me want to fall."    Family History  Problem Relation Age of Onset  . Ovarian cancer Mother   .  Breast cancer Mother   . Brain cancer Mother   . CAD Father   . Diabetes Father   . Hepatitis Father   . Liver disease Father   . Cancer Father   . Alcohol abuse Father   . Heart attack Brother   . Sleep apnea Brother   . Diabetes Brother   . ALS Paternal Grandmother   . Stroke Maternal Grandmother     Social History Social History   Tobacco Use  . Smoking status: Current Every Day Smoker    Packs/day: 0.50    Types: Cigarettes  . Smokeless tobacco: Never Used  Substance Use Topics  . Alcohol use: Yes    Alcohol/week: 0.0 standard drinks    Comment: rarely  . Drug use: No    Review of Systems Constitutional: Negative for fever.  Positive for dizziness. Cardiovascular: Negative for chest pain. Respiratory: Negative for shortness of breath. Gastrointestinal: Moderate upper abdominal pain, dull pain specially in the epigastrium and right upper quadrant.  Positive for nausea and vomiting.  Very small amount of diarrhea this morning per patient. Genitourinary: Negative for urinary compaints Musculoskeletal: Negative for musculoskeletal complaints Neurological: Positive for headache. All  other ROS negative  ____________________________________________   PHYSICAL EXAM:  VITAL SIGNS: ED Triage Vitals  Enc Vitals Group     BP 09/12/19 1147 (!) 141/80     Pulse Rate 09/12/19 1147 100     Resp 09/12/19 1147 20     Temp 09/12/19 1146 97.7 F (36.5 C)     Temp Source 09/12/19 1146 Oral     SpO2 09/12/19 1147 97 %     Weight 09/12/19 1147 155 lb (70.3 kg)     Height 09/12/19 1147 5\' 4"  (1.626 m)     Head Circumference --      Peak Flow --      Pain Score --      Pain Loc --      Pain Edu? --      Excl. in [place]? --    Constitutional: Patient is awake alert, patient is actively vomiting during my evaluation.  Is able to stop and speak for minutes at a time before vomiting once again. Eyes: Normal exam ENT      Head: Normocephalic and atraumatic.      Mouth/Throat: Mucous membranes are moist. Cardiovascular: Normal rate, regular rhythm Respiratory: Normal respiratory effort without tachypnea nor retractions. Breath sounds are clear Gastrointestinal: Soft, moderate epigastric tenderness palpation no rebound guarding or distention.  Bilious vomiting. Musculoskeletal: Nontender with normal range of motion in all extremities. Neurologic:  Normal speech and language.  Appears to be able to move all extremities no gross deficits. Skin:  Skin is warm, dry Psychiatric: Mood and affect are normal.      RADIOLOGY  CT scan of the head is negative for acute abnormality. CT abdomen pelvis negative.  ____________________________________________   INITIAL IMPRESSION / ASSESSMENT AND PLAN / ED COURSE  Pertinent labs & imaging results that were available during my care of the patient were reviewed by me and considered in my medical decision making (see chart for details).   Patient presents to the emergency department what appears to be bilious vomiting also describes upper abdominal discomfort.  Patient appears to be dehydrated based on her lab work with renal insufficiency.   Has an elevated white blood cell count which could be due to to intra-abdominal infection or stress response from nausea vomiting.  Differential is quite broad at this  time but would include intra-abdominal pathology such as gallbladder disease bowel obstruction pancreatitis.  Would also include intracranial event or vertigo.  We will IV hydrate, treat patient's nausea obtain CT imaging of the abdomen and pelvis as well as CT imaging of the head.  Patient is also approximate 24 hours without pain medication and could be in mild withdrawals as well which could be contributing to some of her symptoms.  We will dose a small amount of IV pain medication while awaiting results.  Patient continues with nausea and vomiting despite multiple rounds of medication and IV fluids.  At this time I ordered an MRI of the head to rule out cerebellar infarct given persistent nausea vomiting.  Treated with IV Ativan, however as soon as the patient moved onto the bed she became nauseated once again with vomiting unable to lie still for an MRI.  Patient symptoms do appear to be motion intensified or provoked which would indicate possible BPPV.  However the patient continues to be significantly nauseated unable to get out of bed without vomiting I do not believe the patient would be able to go home in her current state.  We will admit to the hospitalist service for continued treatment IV hydration and hopefully an MRI if the patient is able to tolerate.  [name] was evaluated in Emergency Department on 09/12/2019 for the symptoms described in the history of present illness. She was evaluated in the context of the global COVID-19 pandemic, which necessitated consideration that the patient might be at risk for infection with the SARS-CoV-2 virus that causes COVID-19. Institutional protocols and algorithms that pertain to the evaluation of patients at risk for COVID-19 are in a state of rapid change based on information released by  regulatory bodies including the CDC and federal and state organizations. These policies and algorithms were followed during the patient's care in the ED.  ____________________________________________   FINAL CLINICAL IMPRESSION(S) / ED DIAGNOSES  Nausea vomiting Dehydration Abdominal pain   Harvest Dark, MD 09/12/19 2121

## 2019-09-12 NOTE — ED Notes (Signed)
Pt back from CT

## 2019-09-12 NOTE — ED Notes (Signed)
Pt laying in bed talking on phone with no distress. No signs of emesis at this time. MRI here. Pt assisted up to the bedside commode. Pt very talkative. Pt tolerated well.

## 2019-09-12 NOTE — H&P (Signed)
History and Physical    [name] [id number] DOB: [birth year] DOA: 09/12/2019  PCP: Townsend Roger, MD   Patient coming from: home I have personally briefly reviewed patient's old medical records in [place]  Chief Complaint: nausea, vomiting and dizziness  HPI: [name] is a 59 y.o. female with medical history significant for adrenal insufficiency on chronic prednisone therapy, CAD, diastolic CHF, COPD, chronic pain and GERD who presents to the emergency room with a 3-day history of nausea and vomiting that worsened in the day of arrival.  She has associated headache.  Vomiting is nonbloody and nonbilious and consists of ingested food stuff.  Dizziness is provoked by change in position the change in the direction of her gaze.  She has associated headache that started after several bouts of vomiting that has since resolved with treatment in the ER.  Denies numbness weakness or tingling in the face or extremities.  She has had intermittent abdominal pain.  Denies dysuria or change in bowel habits.  Denies cough, shortness of breath, fever or chills..   ED Course: Upon Arrival in the emergency room she was afebrile with blood pressure 133/80, HR 102, O2 sat 96% on room air.  Her blood work was significant for potassium of 2.9, creatinine 1.15 up from baseline of 0.84, anion gap 17.  White cell count was 17,000.  After several hours of observation, IV hydration in the emergency room, anion gap decreased to 15, potassium remained the same.  She did have a CT abdomen and pelvis that showed no acute findings, urinalysis was negative.  CT head showed no acute findings.  RI was attempted but patient could not tolerate lying flat for an extended.  As it was not her nausea.  Hospitalist admission requested. Review of Systems: As per HPI otherwise 10 point review of systems negative.    Past Medical History:  Diagnosis Date  . Adrenal insufficiency ([place])   . Anxiety   . CAD (coronary  artery disease)   . Cancer ([place])    skin  . Chronic diastolic CHF (congestive heart failure) ([place])   . Chronic pain   . COPD (chronic obstructive pulmonary disease) ([place])   . Cushing disease ([place])   . Dementia ([place])   . GERD (gastroesophageal reflux disease)   . HLD (hyperlipidemia)     Past Surgical History:  Procedure Laterality Date  . APPENDECTOMY    . NASAL SEPTUM SURGERY    . NASAL SINUS SURGERY    . UMBILICAL HERNIA REPAIR    . VAGINAL HYSTERECTOMY       reports that she has been smoking cigarettes. She has been smoking about 0.50 packs per day. She has never used smokeless tobacco. She reports current alcohol use. She reports that she does not use drugs.  Allergies  Allergen Reactions  . Bupropion Other (See Comments), Nausea Only, Shortness Of Breath and Nausea And Vomiting    cold sweats, extended release, only can take [facility] brand Issues with extended release medications.  [place] [Levofloxacin In D5w] Anaphylaxis  . Amoxicillin Itching  . Codeine Nausea And Vomiting  . Cymbalta [Duloxetine Hcl] Swelling  . Gatifloxacin Hives and Swelling    Other Reaction: Other reaction: Swelling  . Guaifenesin & Derivatives Hives and Swelling  . Keflex [Cephalexin] Other (See Comments)    Unknown  . Klonopin [Clonazepam] Other (See Comments)    "Makes me have bad thoughts"  . Macrobid [employer] Macro] Other (See Comments)    "  kidneys shuts down" Chills, Fever.  . Penicillins Hives    Has patient had a PCN reaction causing immediate rash, facial/tongue/throat swelling, SOB or lightheadedness with hypotension: Yes Has patient had a PCN reaction causing severe rash involving mucus membranes or skin necrosis: No Has patient had a PCN reaction that required hospitalization: No Has patient had a PCN reaction occurring within the last 10 years: No If all of the above answers are "NO", then may proceed with Cephalosporin use.  . Sulfa Antibiotics Hives  .  Amitriptyline Rash    "It makes me want to fall."    Family History  Problem Relation Age of Onset  . Ovarian cancer Mother   . Breast cancer Mother   . Brain cancer Mother   . CAD Father   . Diabetes Father   . Hepatitis Father   . Liver disease Father   . Cancer Father   . Alcohol abuse Father   . Heart attack Brother   . Sleep apnea Brother   . Diabetes Brother   . ALS Paternal Grandmother   . Stroke Maternal Grandmother      Prior to Admission medications   Medication Sig Start Date End Date Taking? Authorizing Provider  albuterol (PROAIR HFA) 108 (90 BASE) MCG/ACT inhaler Inhale 2 puffs into the lungs every 6 (six) hours as needed. 06/04/15   [provider]  ALPRAZolam Duanne Moron) 1 MG tablet Take 1 tablet by mouth 4 (four) times daily.    [provider]  benzonatate (TESSALON PERLES) 100 MG capsule Take 1 capsule (100 mg total) by mouth 3 (three) times daily as needed for cough. 07/09/18   Hinda Kehr, MD  Calcium Carbonate-Vitamin D 600-400 MG-UNIT tablet Take 1 tablet by mouth daily. 05/22/15 09/07/17  [provider]  cyclobenzaprine (FLEXERIL) 10 MG tablet Take 1 tablet by mouth 3 (three) times daily as needed.    [provider]  donepezil (ARICEPT) 10 MG tablet Take 10 mg by mouth at bedtime.    [provider]  doxycycline (VIBRAMYCIN) 100 MG capsule Take 1 capsule (100 mg) by mouth twice daily for 10 days. 07/09/18   Hinda Kehr, MD  fluticasone (FLONASE) 50 MCG/ACT nasal spray Place 2 sprays into the nose daily. 01/02/13   [provider]  Fluticasone-Salmeterol (ADVAIR DISKUS) 250-50 MCG/DOSE AEPB Inhale 1 puff into the lungs every 12 (twelve) hours. 06/04/15 09/07/17  [provider]  furosemide (LASIX) 80 MG tablet Take 2 tablets by mouth daily. May take additional 80mg  if needed    [provider]  gabapentin (NEURONTIN) 600 MG tablet Take 1 tablet by mouth 3 (three) times daily.    [provider]  hydrOXYzine (ATARAX/VISTARIL) 25 MG tablet Take 1 tablet by mouth 3 (three) times daily as needed. 01/02/13   [provider]  magnesium oxide (MAG-OX) 400 MG tablet Take 400 mg by mouth daily.    [provider]  metroNIDAZOLE (FLAGYL) 500 MG tablet Take 1 tablet (500 mg total) by mouth 3 (three) times daily. 09/09/17   Epifanio Lesches, MD  omeprazole (PRILOSEC) 40 MG capsule Take 1 capsule by mouth 2 (two) times daily. 02/19/15 09/07/17  [provider]  oxyCODONE-acetaminophen (PERCOCET) 10-325 MG tablet Take 1 tablet by mouth every 6 (six) hours as needed (pt has to take [facility] brand).  10/05/12   [provider]  potassium chloride SA (K-DUR,KLOR-CON) 20 MEQ tablet Take 1 tablet (20 mEq total) by mouth 2 (two) times daily. 09/09/17  Epifanio Lesches, MD  predniSONE (DELTASONE) 1 MG tablet Take 2 mg by mouth daily with breakfast.    [provider]  predniSONE (DELTASONE) 5 MG tablet Take 15 mg by mouth daily with breakfast.    [provider]  promethazine (PHENERGAN) 25 MG tablet Take 25 mg by mouth every 8 (eight) hours as needed for nausea or vomiting.    [provider]  ranitidine (ZANTAC) 150 MG tablet Take 150 mg by mouth 2 (two) times daily.    [provider]  risedronate (ACTONEL) 35 MG tablet Take 35 mg by mouth every 7 (seven) days. with water on empty stomach, nothing by mouth or lie down for next 30 minutes.    [provider]    Physical Exam: Vitals:   09/12/19 1730 09/12/19 1800 09/12/19 1830 09/12/19 2000  BP: 137/64 131/83 130/77 113/85  Pulse: 96 98 97 (!) 105  Resp: 20   18  Temp:      TempSrc:      SpO2: 100% 97% 98% 100%  Weight:      Height:         Vitals:   09/12/19 1730 09/12/19 1800 09/12/19 1830 09/12/19 2000  BP: 137/64 131/83 130/77 113/85  Pulse: 96 98 97 (!) 105  Resp: 20   18  Temp:      TempSrc:      SpO2: 100% 97% 98% 100%  Weight:       Height:        Constitutional: NAD, alert and oriented x 3 Eyes: PERRL, lids and conjunctivae normal ENMT: Mucous membranes are moist.  Neck: normal, supple, no masses, no thyromegaly Respiratory: clear to auscultation bilaterally, no wheezing, no crackles. Normal respiratory effort. No accessory muscle use.  Cardiovascular: Regular rate and rhythm, no murmurs / rubs / gallops. No extremity edema. 2+ pedal pulses. No carotid bruits.  Abdomen: no tenderness, no masses palpated. No hepatosplenomegaly. Bowel sounds positive.  Musculoskeletal: no clubbing / cyanosis. No joint deformity upper and lower extremities.  Skin: no rashes, lesions, ulcers.  Neurologic: No gross focal neurologic deficit.CN II-XII intact. No nystagmus or cerebellar signs Psychiatric: Normal mood and affect.   Labs on Admission: I have personally reviewed following labs and imaging studies  CBC: Recent Labs  Lab 09/12/19 1148  WBC 17.3*  HGB 14.9  HCT 45.2  MCV 89.7  PLT [id number]   Basic Metabolic Panel: Recent Labs  Lab 09/12/19 1148 09/12/19 1922  NA 137 138  K 3.0* 2.9*  CL 89* 95*  CO2 31 28  GLUCOSE 253* 120*  BUN 25* 22*  CREATININE 1.15* 1.04*  CALCIUM 9.9 8.9   GFR: Estimated Creatinine Clearance: 57.4 mL/min (A) (by C-G formula based on SCr of 1.04 mg/dL (H)). Liver Function Tests: Recent Labs  Lab 09/12/19 1148  AST 23  ALT 23  ALKPHOS 98  BILITOT 1.8*  PROT 8.1  ALBUMIN 4.2   Recent Labs  Lab 09/12/19 1148  LIPASE 18   No results for input(s): AMMONIA in the last 168 hours. Coagulation Profile: No results for input(s): INR, PROTIME in the last 168 hours. Cardiac Enzymes: No results for input(s): CKTOTAL, CKMB, CKMBINDEX, TROPONINI in the last 168 hours. BNP (last 3 results) No results for input(s): PROBNP in the last 8760 hours. HbA1C: No results for input(s): HGBA1C in the last 72 hours. CBG: No results for input(s): GLUCAP in the last 168 hours. Lipid Profile: No  results for input(s): CHOL, HDL, LDLCALC, TRIG, CHOLHDL,  LDLDIRECT in the last 72 hours. Thyroid Function Tests: No results for input(s): TSH, T4TOTAL, FREET4, T3FREE, THYROIDAB in the last 72 hours. Anemia Panel: No results for input(s): VITAMINB12, FOLATE, FERRITIN, TIBC, IRON, RETICCTPCT in the last 72 hours. Urine analysis:    Component Value Date/Time   COLORURINE STRAW (A) 09/12/2019 1315   APPEARANCEUR CLEAR (A) 09/12/2019 1315   LABSPEC 1.005 09/12/2019 1315   PHURINE 7.0 09/12/2019 1315   GLUCOSEU NEGATIVE 09/12/2019 1315   HGBUR SMALL (A) 09/12/2019 1315   BILIRUBINUR NEGATIVE 09/12/2019 1315   KETONESUR 5 (A) 09/12/2019 1315   PROTEINUR NEGATIVE 09/12/2019 1315   NITRITE NEGATIVE 09/12/2019 1315   LEUKOCYTESUR NEGATIVE 09/12/2019 1315    Radiological Exams on Admission: CT Head Wo Contrast  Result Date: 09/12/2019 CLINICAL DATA:  Headache with nausea/vomiting; headache, acute, normal neuro exam. Additional history provided: Dizziness for 2 days. EXAM: CT HEAD WITHOUT CONTRAST TECHNIQUE: Contiguous axial images were obtained from the base of the skull through the vertex without intravenous contrast. COMPARISON:  Brain MRI 12/21/2018, head CT 12/21/2018 FINDINGS: Brain: No evidence of acute intracranial hemorrhage. No demarcated cortical infarction. No evidence of intracranial mass. No midline shift or extra-axial fluid collection. Stable, mild generalized parenchymal atrophy. Redemonstrated cavum septum pellucidum and cavum vergae. Vascular: No hyperdense vessel.  Atherosclerotic calcifications. Skull: Normal. Negative for fracture or focal lesion. Sinuses/Orbits: Visualized orbits demonstrate no acute abnormality. Postsurgical appearance of the paranasal sinuses. Minimal scattered paranasal sinus mucosal thickening at the imaged levels. No significant mastoid effusion. IMPRESSION: No evidence of acute intracranial abnormality. Stable, mild generalized parenchymal atrophy.  Electronically Signed   By: Kellie Simmering DO   On: 09/12/2019 17:33   CT ABDOMEN PELVIS W CONTRAST  Result Date: 09/12/2019 CLINICAL DATA:  Presents vis EMS with n/v and dizzy for 2 days ^124mL OMNIPAQUE IOHEXOL 300 MG/ML SOLNAbd pain, unspecified abd pain, N/V EXAM: CT ABDOMEN AND PELVIS WITH CONTRAST TECHNIQUE: Multidetector CT imaging of the abdomen and pelvis was performed using the standard protocol following bolus administration of intravenous contrast. CONTRAST:  126mL OMNIPAQUE IOHEXOL 300 MG/ML  SOLN COMPARISON:  CT abdomen 08/31/2017 FINDINGS: Lower chest: Lung bases are clear. Hepatobiliary: No focal hepatic lesion. No biliary duct dilatation. Gallbladder is normal. Common bile duct is normal. Pancreas: Pancreas is normal. No ductal dilatation. No pancreatic inflammation. Spleen: Normal spleen Adrenals/urinary tract: Adrenal glands and kidneys are normal. The ureters and bladder normal. Stomach/Bowel: Stomach, small-bowel and cecum normal. Post appendectomy. Colon and rectosigmoid colon normal. Diverticulosis of the LEFT colon without diverticulitis. Vascular/Lymphatic: Abdominal aorta is normal caliber with atherosclerotic calcification. There is no retroperitoneal or periportal lymphadenopathy. No pelvic lymphadenopathy. Reproductive: Post hysterectomy Other: No free fluid. Musculoskeletal: No aggressive osseous lesion. 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 There is significant inflammation in the soft tissues about the LEFT hip joint. IMPRESSION: 1. No acute findings in the abdomen pelvis. 2. LEFT colon diverticulosis without diverticulitis. 3. Aortic Atherosclerosis (ICD10-I70.0).\ 4. 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 Electronically Signed   By: Suzy Bouchard M.D.   On: 09/12/2019 17:29    EKG: Independently reviewed.   Assessment/Plan Principal Problem:   Intractable nausea and  vomiting   Suspect vertigo, central versus peripheral -No clear etiology for intractable nausea and vomiting. -Differential includes vertigo, central versus peripheral, also have to do with adrenal insufficiency -Lipase 18, CT abdomen and pelvis with no acute findings -Suspect vertigo, unknown whether central  or peripheral at this time. -CT head with no acute findings.  -- MRI after symptomatic improvement when patient able to tolerated    AKI (acute kidney injury) ([place])   Hypokalemia due to excessive gastrointestinal loss of potassium -Prerenal AKI related to dehydration from excessive vomiting -Hypokalemia possibly multifactorial from vomiting, home diuretic therapy and does have a history of adrenal insufficiency -IV hydration with normal saline with potassium --Monitor and replete potassium    Adrenal insufficiency ([facility]) -Hydrocortisone 100 mg IV (stress dose for now) to replace prednisone given intractable vomiting    Chronic diastolic CHF (congestive heart failure) ([facility]) -Euvolemic at time of evaluation -Monitor for symptoms of overload given IV hydration    Current chronic use of systemic steroids -IV hydrocortisone to replace prednisone if no intractable vomiting    COPD (chronic obstructive pulmonary disease) ([place]) -Not acutely exacerbated -As needed bronc Chronic pain    Anxiety -Resume home medicine when tolerating orally - IV lorazepam as needed anxiety until tolerating orally    DVT prophylaxis: lovenox  Code Status: full code  Family Communication: none  Disposition Plan: Back to previous home environment Consults called: none     Athena Masse MD [facility]     09/12/2019, 10:09 PM

## 2019-09-12 NOTE — ED Notes (Signed)
Pt given phone to screen for MRI.

## 2019-09-12 NOTE — ED Notes (Signed)
Pt back from MRI. Pt unable to lay down for test.

## 2019-09-12 NOTE — ED Notes (Signed)
Pt requesting something to drink. Told pt that if she is nauseated and vomiting she is unable to have anything po at this time. Pt has been stable for hours with no emesis until pt was taken to MRI. Pt states she had to get up and down too much and that's why the nausea came back. Fluids infusing.

## 2019-09-12 NOTE — ED Notes (Signed)
explained the wait  States she is starting to feel the same as she did when she arrived

## 2019-09-12 NOTE — ED Notes (Signed)
This RN and Dr Kerman Passey at bedside. Pt states she cant get up and walk around without feeling dizzy. Pt denies hx of vertigo. Pt states she has abd and back pain along with a headache. Pt states she takes her glasses off to prevent her from looking around which causes an increase in nausea. Pt states she has cushings disease and checks her blood sugar from time to time.

## 2019-09-12 NOTE — ED Triage Notes (Signed)
Presents vis EMS with n/v for 2 days    FS 240

## 2019-09-13 ENCOUNTER — Observation Stay: Payer: Medicare Other

## 2019-09-13 DIAGNOSIS — Z880 Allergy status to penicillin: Secondary | ICD-10-CM | POA: Diagnosis not present

## 2019-09-13 DIAGNOSIS — G8929 Other chronic pain: Secondary | ICD-10-CM | POA: Diagnosis present

## 2019-09-13 DIAGNOSIS — Z79899 Other long term (current) drug therapy: Secondary | ICD-10-CM | POA: Diagnosis not present

## 2019-09-13 DIAGNOSIS — E860 Dehydration: Secondary | ICD-10-CM | POA: Diagnosis present

## 2019-09-13 DIAGNOSIS — E876 Hypokalemia: Secondary | ICD-10-CM | POA: Diagnosis present

## 2019-09-13 DIAGNOSIS — N179 Acute kidney failure, unspecified: Secondary | ICD-10-CM | POA: Diagnosis present

## 2019-09-13 DIAGNOSIS — Z888 Allergy status to other drugs, medicaments and biological substances status: Secondary | ICD-10-CM | POA: Diagnosis not present

## 2019-09-13 DIAGNOSIS — I2510 Atherosclerotic heart disease of native coronary artery without angina pectoris: Secondary | ICD-10-CM | POA: Diagnosis present

## 2019-09-13 DIAGNOSIS — J449 Chronic obstructive pulmonary disease, unspecified: Secondary | ICD-10-CM | POA: Diagnosis present

## 2019-09-13 DIAGNOSIS — E2749 Other adrenocortical insufficiency: Secondary | ICD-10-CM | POA: Diagnosis present

## 2019-09-13 DIAGNOSIS — R112 Nausea with vomiting, unspecified: Secondary | ICD-10-CM | POA: Diagnosis not present

## 2019-09-13 DIAGNOSIS — Z85828 Personal history of other malignant neoplasm of skin: Secondary | ICD-10-CM | POA: Diagnosis not present

## 2019-09-13 DIAGNOSIS — Z7952 Long term (current) use of systemic steroids: Secondary | ICD-10-CM | POA: Diagnosis not present

## 2019-09-13 DIAGNOSIS — E785 Hyperlipidemia, unspecified: Secondary | ICD-10-CM | POA: Diagnosis present

## 2019-09-13 DIAGNOSIS — Z20822 Contact with and (suspected) exposure to covid-19: Secondary | ICD-10-CM | POA: Diagnosis present

## 2019-09-13 DIAGNOSIS — F39 Unspecified mood [affective] disorder: Secondary | ICD-10-CM | POA: Diagnosis present

## 2019-09-13 DIAGNOSIS — F17210 Nicotine dependence, cigarettes, uncomplicated: Secondary | ICD-10-CM | POA: Diagnosis present

## 2019-09-13 DIAGNOSIS — Z885 Allergy status to narcotic agent status: Secondary | ICD-10-CM | POA: Diagnosis not present

## 2019-09-13 DIAGNOSIS — I5032 Chronic diastolic (congestive) heart failure: Secondary | ICD-10-CM | POA: Diagnosis present

## 2019-09-13 DIAGNOSIS — F419 Anxiety disorder, unspecified: Secondary | ICD-10-CM | POA: Diagnosis present

## 2019-09-13 DIAGNOSIS — F0390 Unspecified dementia without behavioral disturbance: Secondary | ICD-10-CM | POA: Diagnosis present

## 2019-09-13 DIAGNOSIS — Z882 Allergy status to sulfonamides status: Secondary | ICD-10-CM | POA: Diagnosis not present

## 2019-09-13 DIAGNOSIS — B349 Viral infection, unspecified: Secondary | ICD-10-CM | POA: Diagnosis present

## 2019-09-13 DIAGNOSIS — K219 Gastro-esophageal reflux disease without esophagitis: Secondary | ICD-10-CM | POA: Diagnosis present

## 2019-09-13 DIAGNOSIS — Z881 Allergy status to other antibiotic agents status: Secondary | ICD-10-CM | POA: Diagnosis not present

## 2019-09-13 LAB — CBC
HCT: 38.8 % (ref 36.0–46.0)
Hemoglobin: 12.8 g/dL (ref 12.0–15.0)
MCH: 30 pg (ref 26.0–34.0)
MCHC: 33 g/dL (ref 30.0–36.0)
MCV: 91.1 fL (ref 80.0–100.0)
Platelets: 288 10*3/uL (ref 150–400)
RBC: 4.26 MIL/uL (ref 3.87–5.11)
RDW: 13.4 % (ref 11.5–15.5)
WBC: 9.4 10*3/uL (ref 4.0–10.5)
nRBC: 0 % (ref 0.0–0.2)

## 2019-09-13 LAB — BASIC METABOLIC PANEL
Anion gap: 12 (ref 5–15)
BUN: 18 mg/dL (ref 6–20)
CO2: 28 mmol/L (ref 22–32)
Calcium: 9.1 mg/dL (ref 8.9–10.3)
Chloride: 97 mmol/L — ABNORMAL LOW (ref 98–111)
Creatinine, Ser: 0.98 mg/dL (ref 0.44–1.00)
GFR calc Af Amer: >60 mL/min (ref >60)
GFR calc non Af Amer: >60 mL/min (ref >60)
Glucose, Bld: 130 mg/dL — ABNORMAL HIGH (ref 70–99)
Potassium: 3 mmol/L — ABNORMAL LOW (ref 3.5–5.1)
Sodium: 137 mmol/L (ref 135–145)

## 2019-09-13 LAB — HIV ANTIBODY (ROUTINE TESTING W REFLEX): HIV Screen 4th Generation wRfx: NONREACTIVE

## 2019-09-13 LAB — SARS CORONAVIRUS 2 BY RT PCR (DIASORIN): SARS Coronavirus 2: NEGATIVE

## 2019-09-13 MED ORDER — POTASSIUM CHLORIDE CRYS ER 20 MEQ PO TBCR
40.0000 meq | EXTENDED_RELEASE_TABLET | Freq: Once | ORAL | Status: AC
  Administered 2019-09-13: 40 meq via ORAL
  Filled 2019-09-13: qty 2

## 2019-09-13 MED ORDER — DIAZEPAM 5 MG PO TABS
5.0000 mg | ORAL_TABLET | Freq: Once | ORAL | Status: AC | PRN
  Administered 2019-09-13: 5 mg via ORAL
  Filled 2019-09-13: qty 1

## 2019-09-13 MED ORDER — GABAPENTIN 600 MG PO TABS
600.0000 mg | ORAL_TABLET | Freq: Three times a day (TID) | ORAL | Status: DC
  Administered 2019-09-13 – 2019-09-14 (×3): 600 mg via ORAL
  Filled 2019-09-13 (×6): qty 1

## 2019-09-13 MED ORDER — OXYCODONE HCL 5 MG PO TABS
5.0000 mg | ORAL_TABLET | Freq: Four times a day (QID) | ORAL | Status: DC | PRN
  Administered 2019-09-14: 5 mg via ORAL
  Filled 2019-09-13: qty 1

## 2019-09-13 MED ORDER — GABAPENTIN 300 MG PO CAPS
600.0000 mg | ORAL_CAPSULE | Freq: Three times a day (TID) | ORAL | Status: DC
  Administered 2019-09-13: 600 mg via ORAL
  Filled 2019-09-13 (×2): qty 2

## 2019-09-13 MED ORDER — HYDROCORTISONE NA SUCCINATE PF 100 MG IJ SOLR
20.0000 mg | Freq: Two times a day (BID) | INTRAMUSCULAR | Status: DC
  Administered 2019-09-14: 20 mg via INTRAVENOUS
  Filled 2019-09-13 (×2): qty 0.4

## 2019-09-13 MED ORDER — CYCLOBENZAPRINE HCL 10 MG PO TABS
5.0000 mg | ORAL_TABLET | Freq: Three times a day (TID) | ORAL | Status: DC | PRN
  Administered 2019-09-14: 09:00:00 5 mg via ORAL
  Filled 2019-09-13: qty 1

## 2019-09-13 MED ORDER — OXYCODONE-ACETAMINOPHEN 5-325 MG PO TABS
1.0000 | ORAL_TABLET | Freq: Four times a day (QID) | ORAL | Status: DC | PRN
  Administered 2019-09-13 – 2019-09-14 (×3): 1 via ORAL
  Filled 2019-09-13 (×3): qty 1

## 2019-09-13 NOTE — Progress Notes (Signed)
PROGRESS NOTE    [name]  [id number] DOB: Aug 21, 1960 DOA: 09/12/2019 PCP: Townsend Roger, MD      Brief Narrative:  [name] is a 59 y.o. F with COPD not on O2 but on chronic prednisone, secondary adrenal insufficiency, dCHF, CAD, and chronic osteoarthritic pain who presented with intractable nausea and vomiting for 3 days as well as vertigo with head movement.  In the ER, CT head unremarkable.  K 2.9, Cr 1.15 up from baseline 0.84.  CT abdomen and pelvis also normal.  Could not tolerate PO intake, and so hospitalist service were asked to evaluate for admission.          Assessment & Plan:  Intractable nausea and vomiting  This seems to be due to her vertigo (it got worse when her vertigo worsened).   Patient unable to keep any food or fluids down today. -Continue PRN Zofran or Compazine -Continue IV fluids   Vertigo Mostly positional.  Suppressed by sitting still. -Obtain MRI brain to rule out central lesion  AKI Hypokalemia Cr normalized, K still low severely -Continue IV fluids with K  Adrenal insufficiency -Continue HC, reduced to 20 BID -Resume home prednisone 15 mg daily on 2/26  COPD without flare No wheezing or dyspnea to suggest flare -Continue SABA and Flonase  Chronic diastolic CHF -Hold diuretic until able to take PO  Mood disorder -Continue Bupropion      Disposition: The patient was admitted with intractable nausea and vomiting.  At this time, continued inpatient services are reasonable and expected, given the patient's: Severity of presentation: inability to walk or get out of bed due to vertigo, recurrent vomiting, ongoing IV fluids, and the high likelihood of an adverse outcome, including readmission, debility or death if the patient were to be discharged prematurely.       MDM: The below labs and imaging reports were reviewed and summarized above.  Medication management as above.      DVT prophylaxis: Lovenox Code  Status: FULL Family Communication:     Consultants:     Procedures:   2/23 CT head  2/24 MRI brain  Antimicrobials:      Culture data:              Subjective: Unable to take anything by mouth.  Vomiting.  Still severe vertigo with any movement.  No confusion, fever. Still headache.  Still weak.  Objective: Vitals:   09/12/19 2130 09/12/19 2200 09/12/19 2326 09/13/19 0731  BP: 133/70 127/70 136/90 100/75  Pulse: 97 97 (!) 101 99  Resp:      Temp:   98.7 F (37.1 C) 98.6 F (37 C)  TempSrc:    Oral  SpO2: 99% 99% 98% 96%  Weight:      Height:        Intake/Output Summary (Last 24 hours) at 09/13/2019 1522 Last data filed at 09/13/2019 0955 Gross per 24 hour  Intake 397.2 ml  Output 440 ml  Net -42.8 ml   Filed Weights   09/12/19 1147  Weight: 70.3 kg    Examination: General appearance:  adult female, alert and in mild distress from nausea, pain.  Yawns frequently.   HEENT: Anicteric, conjunctiva pink, lids and lashes normal. No nasal deformity, discharge, epistaxis.  Lips moist, dentition adequate, OP tacky dry, no oral lesions, hearing nromal.   Skin: Warm and dry.  No jaundice.  No suspicious rashes or lesions. Cardiac: RRR, nl S1-S2, no murmurs appreciated.  Capillary refill is  brisk.  JVP not visible.  No LE edema.  Radial pulses 2+ and symmetric. Respiratory: Normal respiratory rate and rhythm.  CTAB without rales or wheezes. Abdomen: Abdomen soft.  No TTP or gaurding. No ascites, distension, hepatosplenomegaly.   MSK: No deformities or effusions. Neuro: Awake and alert.  EOMI, moves all extremities. Speech fluent.    Psych: Sensorium intact and responding to questions, attention normal. Affect anxious.  Judgment and insight appear normal.    Data Reviewed: I have personally reviewed following labs and imaging studies:  CBC: Recent Labs  Lab 09/12/19 1148 09/13/19 0445  WBC 17.3* 9.4  HGB 14.9 12.8  HCT 45.2 38.8  MCV 89.7 91.1   PLT 356 [id number]   Basic Metabolic Panel: Recent Labs  Lab 09/12/19 1148 09/12/19 1922 09/13/19 0445  NA 137 138 137  K 3.0* 2.9* 3.0*  CL 89* 95* 97*  CO2 31 28 28   GLUCOSE 253* 120* 130*  BUN 25* 22* 18  CREATININE 1.15* 1.04* 0.98  CALCIUM 9.9 8.9 9.1   GFR: Estimated Creatinine Clearance: 60.9 mL/min (by C-G formula based on SCr of 0.98 mg/dL). Liver Function Tests: Recent Labs  Lab 09/12/19 1148  AST 23  ALT 23  ALKPHOS 98  BILITOT 1.8*  PROT 8.1  ALBUMIN 4.2   Recent Labs  Lab 09/12/19 1148  LIPASE 18   No results for input(s): AMMONIA in the last 168 hours. Coagulation Profile: No results for input(s): INR, PROTIME in the last 168 hours. Cardiac Enzymes: No results for input(s): CKTOTAL, CKMB, CKMBINDEX, TROPONINI in the last 168 hours. BNP (last 3 results) No results for input(s): PROBNP in the last 8760 hours. HbA1C: No results for input(s): HGBA1C in the last 72 hours. CBG: No results for input(s): GLUCAP in the last 168 hours. Lipid Profile: No results for input(s): CHOL, HDL, LDLCALC, TRIG, CHOLHDL, LDLDIRECT in the last 72 hours. Thyroid Function Tests: No results for input(s): TSH, T4TOTAL, FREET4, T3FREE, THYROIDAB in the last 72 hours. Anemia Panel: No results for input(s): VITAMINB12, FOLATE, FERRITIN, TIBC, IRON, RETICCTPCT in the last 72 hours. Urine analysis:    Component Value Date/Time   COLORURINE STRAW (A) 09/12/2019 1315   APPEARANCEUR CLEAR (A) 09/12/2019 1315   LABSPEC 1.005 09/12/2019 1315   PHURINE 7.0 09/12/2019 1315   GLUCOSEU NEGATIVE 09/12/2019 1315   HGBUR SMALL (A) 09/12/2019 1315   BILIRUBINUR NEGATIVE 09/12/2019 1315   KETONESUR 5 (A) 09/12/2019 1315   PROTEINUR NEGATIVE 09/12/2019 1315   NITRITE NEGATIVE 09/12/2019 1315   LEUKOCYTESUR NEGATIVE 09/12/2019 1315   Sepsis Labs: @LABRCNTIP (procalcitonin:4,lacticacidven:4)  ) Recent Results (from the past 240 hour(s))  SARS Coronavirus 2 by RT PCR     Status: None    Collection Time: 09/12/19  9:48 PM  Result Value Ref Range Status   SARS Coronavirus 2 NEGATIVE NEGATIVE Final    Comment: (NOTE) Result indicates the ABSENCE of SARS-CoV-2 RNA in the patient specimen.  The lowest concentration of SARS-CoV-2 viral copies this assay can detect in nasopharyngeal swab specimens is 500 copies / mL.  A negative result does not preclude SARS-CoV-2 infection and should not be used as the sole basis for patient management decisions. A negative result may occur with improper specimen collection / handling, submission of a specimen other than nasopharyngeal swab, presence of viral mutation(s) within the areas targeted by this assay, and inadequate number of viral copies (<500 copies / mL) present.  Negative results must be combined with clinical observations, patient history, and epidemiological  information.  The expected result is NEGATIVE.  Patient Fact Sheet:  BlogSelections.co.uk   Provider Fact Sheet:  https://lucas.com/   This test is not yet approved or cleared by the [place] FDA and  has been authorized for  detection and/or diagnosis of SARS-CoV-2 by FDA under an Emergency Use Authorization (EUA).  This EUA will remain in effect (meaning this test can be used) for the duration of  the COVID-19 declaration under Section 564(b)(1) of the Act, 21 U.S.C. section 360bbb-3(b)(1), unless the authorization is terminated or revoked sooner Performed at [facility], [place] [address] [place], [place] [address]          Radiology Studies: CT Head Wo Contrast  Result Date: 09/12/2019 CLINICAL DATA:  Headache with nausea/vomiting; headache, acute, normal neuro exam. Additional history provided: Dizziness for 2 days. EXAM: CT HEAD WITHOUT CONTRAST TECHNIQUE: Contiguous axial images were obtained from the base of the skull through the vertex without intravenous contrast. COMPARISON:  Brain MRI 12/21/2018,  head CT 12/21/2018 FINDINGS: Brain: No evidence of acute intracranial hemorrhage. No demarcated cortical infarction. No evidence of intracranial mass. No midline shift or extra-axial fluid collection. Stable, mild generalized parenchymal atrophy. Redemonstrated cavum septum pellucidum and cavum vergae. Vascular: No hyperdense vessel.  Atherosclerotic calcifications. Skull: Normal. Negative for fracture or focal lesion. Sinuses/Orbits: Visualized orbits demonstrate no acute abnormality. Postsurgical appearance of the paranasal sinuses. Minimal scattered paranasal sinus mucosal thickening at the imaged levels. No significant mastoid effusion. IMPRESSION: No evidence of acute intracranial abnormality. Stable, mild generalized parenchymal atrophy. Electronically Signed   By: Kellie Simmering DO   On: 09/12/2019 17:33   MR BRAIN WO CONTRAST  Result Date: 09/13/2019 CLINICAL DATA:  Vertigo, central. Additional history provided: Nausea, vomiting and dizziness. EXAM: MRI HEAD WITHOUT CONTRAST TECHNIQUE: Multiplanar, multiecho pulse sequences of the brain and surrounding structures were obtained without intravenous contrast. COMPARISON:  Noncontrast head CT examinations 09/12/2019 and earlier, brain MRI 12/21/2018. FINDINGS: Brain: There is no evidence of acute infarct. No evidence of intracranial mass. No midline shift or extra-axial fluid collection. No chronic intracranial blood products. There is no significant white matter disease. Mild generalized parenchymal atrophy. Cavum septum pellucidum and cavum vergae. Vascular: Flow voids maintained within the proximal large arterial vessels. Skull and upper cervical spine: No focal marrow lesion. Sinuses/Orbits: Visualized orbits demonstrate no acute abnormality. Postsurgical appearance of the paranasal sinuses. Moderate mucosal thickening within the inferior left maxillary sinus. No significant mastoid effusion. IMPRESSION: No evidence of acute intracranial abnormality,  including acute infarction. Mild generalized parenchymal atrophy. Postsurgical changes to the paranasal sinuses. Moderate left maxillary sinus mucosal thickening. Electronically Signed   By: Kellie Simmering DO   On: 09/13/2019 14:27   CT ABDOMEN PELVIS W CONTRAST  Result Date: 09/12/2019 CLINICAL DATA:  Presents vis EMS with n/v and dizzy for 2 days ^152mL OMNIPAQUE IOHEXOL 300 MG/ML SOLNAbd pain, unspecified abd pain, N/V EXAM: CT ABDOMEN AND PELVIS WITH CONTRAST TECHNIQUE: Multidetector CT imaging of the abdomen and pelvis was performed using the standard protocol following bolus administration of intravenous contrast. CONTRAST:  127mL OMNIPAQUE IOHEXOL 300 MG/ML  SOLN COMPARISON:  CT abdomen 08/31/2017 FINDINGS: Lower chest: Lung bases are clear. Hepatobiliary: No focal hepatic lesion. No biliary duct dilatation. Gallbladder is normal. Common bile duct is normal. Pancreas: Pancreas is normal. No ductal dilatation. No pancreatic inflammation. Spleen: Normal spleen Adrenals/urinary tract: Adrenal glands and kidneys are normal. The ureters and bladder normal. Stomach/Bowel: Stomach, small-bowel and cecum normal. Post appendectomy. Colon  and rectosigmoid colon normal. Diverticulosis of the LEFT colon without diverticulitis. Vascular/Lymphatic: Abdominal aorta is normal caliber with atherosclerotic calcification. There is no retroperitoneal or periportal lymphadenopathy. No pelvic lymphadenopathy. Reproductive: Post hysterectomy Other: No free fluid. Musculoskeletal: No aggressive osseous lesion. 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 There is significant inflammation in the soft tissues about the LEFT hip joint. IMPRESSION: 1. No acute findings in the abdomen pelvis. 2. LEFT colon diverticulosis without diverticulitis. 3. Aortic Atherosclerosis (ICD10-I70.0).\ 4. 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 Electronically  Signed   By: Suzy Bouchard M.D.   On: 09/12/2019 17:29        Scheduled Meds: . enoxaparin (LOVENOX) injection  40 mg Subcutaneous Q24H  . gabapentin  600 mg Oral TID  . [START ON 09/14/2019] hydrocortisone sod succinate (SOLU-CORTEF) inj  20 mg Intravenous Q12H   Continuous Infusions: . 0.9 % NaCl with KCl 40 mEq / L 75 mL/hr at 09/13/19 0541     LOS: 0 days    Time spent: 25 minutes    Edwin Dada, MD [facility] 09/13/2019, 3:22 PM     Please page though [place] or Epic secure chat:  For [employer], Adult nurse

## 2019-09-13 NOTE — TOC Initial Note (Signed)
Transition of Care [facility]) - Initial/Assessment Note    Patient Details  Name: [name] MRN: [id number] Date of Birth: 05/05/1961  [facility]) CM/SW Contact:    Su Hilt, RN Phone Number: 09/13/2019, 3:05 PM  Clinical Narrative:    Met with the patient and discussed DC plan and needs She lives alone but has assistance from her friends and her brother She wants to have [facility] PT She will need a RW and a 3 in 1 I have notified Brad with [employer] of the need for the DME and notified [place] with [facility] about [facility] needs for PT Will continue to monitor for additional needs               Expected Discharge Plan: [place] Barriers to Discharge: Continued Medical Work up   Patient Goals and CMS Choice Patient states their goals for this hospitalization and ongoing recovery are:: go home      Expected Discharge Plan and Services Expected Discharge Plan: [place]       Living arrangements for the past 2 months: Single Family Home                 DME Arranged: 3-N-1, [place] rolling DME Agency: [employer] Date DME Agency Contacted: 09/13/19 Time DME Agency Contacted: [phone] Representative spoke with at DME Agency: [place]: PT [place]: [facility] Date [place]: 09/13/19 Time [place]: 1504 Representative spoke with at [place]: [place]  Prior Living Arrangements/Services Living arrangements for the past 2 months: [place] Lives with:: Self(has help from friends and brother) Patient language and need for interpreter reviewed:: Yes Do you feel safe going back to the place where you live?: Yes      Need for Family Participation in Patient Care: No (Comment) Care giver support system in place?: Yes (comment)   Criminal Activity/Legal Involvement Pertinent to Current Situation/Hospitalization: No - Comment as needed  Activities of Daily Living Home Assistive  Devices/Equipment: Eyeglasses, Environmental consultant (specify type) ADL Screening (condition at time of admission) Patient's cognitive ability adequate to safely complete daily activities?: Yes Is the patient deaf or have difficulty hearing?: No Does the patient have difficulty seeing, even when wearing glasses/contacts?: No Does the patient have difficulty concentrating, remembering, or making decisions?: Yes Patient able to express need for assistance with ADLs?: Yes Does the patient have difficulty dressing or bathing?: Yes Independently performs ADLs?: No Does the patient have difficulty walking or climbing stairs?: Yes Weakness of Legs: Both Weakness of Arms/Hands: None  Permission Sought/Granted   Permission granted to share information with : Yes, Verbal Permission Granted              Emotional Assessment Appearance:: Appears stated age Attitude/Demeanor/Rapport: Engaged Affect (typically observed): Appropriate Orientation: : Oriented to  Time, Oriented to Place, Oriented to Self, Oriented to Situation Alcohol / Substance Use: Not Applicable Psych Involvement: No (comment)  Admission diagnosis:  Dehydration [E86.0] Intractable vomiting [R11.10] Intractable nausea and vomiting [R11.2] Patient Active Problem List   Diagnosis Date Noted  . Intractable nausea and vomiting 09/12/2019  . Vertigo 09/12/2019  . Chronic diastolic CHF (congestive heart failure) ([place]) 09/12/2019  . Current chronic use of systemic steroids 09/12/2019  . AKI (acute kidney injury) ([place]) 09/12/2019  . Hypokalemia due to excessive gastrointestinal loss of potassium 09/12/2019  . Abdominal pain 09/07/2017  . Diarrhea   . Chest pain 06/17/2015  . Elevated  troponin 06/17/2015  . Fracture, ribs 06/17/2015  . GERD (gastroesophageal reflux disease) 06/17/2015  . COPD (chronic obstructive pulmonary disease) ([place]) 06/17/2015  . Chronic pain 06/17/2015  . Anxiety 06/17/2015  . Adrenal insufficiency ([place]) 06/17/2015    PCP:  Townsend Roger, MD Pharmacy:   [place], [place] - [address] [place] [address] Phone: [phone] Fax: [phone]     Social Determinants of Health (SDOH) Interventions    Readmission Risk Interventions No flowsheet data found.

## 2019-09-13 NOTE — Progress Notes (Signed)
No Nausea or vomiting. Wanting Pain medicine

## 2019-09-14 LAB — CBC
HCT: 39.9 % (ref 36.0–46.0)
Hemoglobin: 12.7 g/dL (ref 12.0–15.0)
MCH: 29.7 pg (ref 26.0–34.0)
MCHC: 31.8 g/dL (ref 30.0–36.0)
MCV: 93.4 fL (ref 80.0–100.0)
Platelets: 267 10*3/uL (ref 150–400)
RBC: 4.27 MIL/uL (ref 3.87–5.11)
RDW: 13.3 % (ref 11.5–15.5)
WBC: 8.1 10*3/uL (ref 4.0–10.5)
nRBC: 0 % (ref 0.0–0.2)

## 2019-09-14 LAB — BASIC METABOLIC PANEL
Anion gap: 8 (ref 5–15)
BUN: 14 mg/dL (ref 6–20)
CO2: 24 mmol/L (ref 22–32)
Calcium: 9.3 mg/dL (ref 8.9–10.3)
Chloride: 109 mmol/L (ref 98–111)
Creatinine, Ser: 0.76 mg/dL (ref 0.44–1.00)
GFR calc Af Amer: >60 mL/min (ref >60)
GFR calc non Af Amer: >60 mL/min (ref >60)
Glucose, Bld: 75 mg/dL (ref 70–99)
Potassium: 3.9 mmol/L (ref 3.5–5.1)
Sodium: 141 mmol/L (ref 135–145)

## 2019-09-14 MED ORDER — OXYCODONE-ACETAMINOPHEN 5-325 MG PO TABS: 1.0000 | ORAL_TABLET | ORAL | Status: DC | PRN

## 2019-09-14 MED ORDER — PROMETHAZINE HCL 25 MG PO TABS: 25.0000 mg | ORAL_TABLET | Freq: Three times a day (TID) | ORAL | 1 refills | Status: DC | PRN

## 2019-09-14 MED ORDER — MECLIZINE HCL 12.5 MG PO TABS
12.5000 mg | ORAL_TABLET | Freq: Three times a day (TID) | ORAL | Status: DC | PRN
  Filled 2019-09-14: qty 1

## 2019-09-14 MED ORDER — OXYCODONE HCL 5 MG PO TABS: 5.0000 mg | ORAL_TABLET | ORAL | Status: DC | PRN

## 2019-09-14 MED ORDER — ONDANSETRON 4 MG PO TBDP: 4.0000 mg | ORAL_TABLET | Freq: Three times a day (TID) | ORAL | 0 refills | Status: DC | PRN

## 2019-09-14 MED ORDER — HYDROXYZINE HCL 25 MG PO TABS
25.0000 mg | ORAL_TABLET | Freq: Three times a day (TID) | ORAL | Status: DC
  Administered 2019-09-14 (×2): 25 mg via ORAL
  Filled 2019-09-14 (×4): qty 1

## 2019-09-14 MED ORDER — MECLIZINE HCL 25 MG PO TABS: 25.0000 mg | ORAL_TABLET | Freq: Three times a day (TID) | ORAL | 0 refills | Status: DC | PRN

## 2019-09-14 NOTE — TOC Progression Note (Signed)
Transition of Care [facility]) - Progression Note    Patient Details  Name: MIKESHIA ZOTTER MRN: [id number] Date of Birth: 30-Jun-1961  Transition of Care [facility]) CM/SW [place], RN Phone Number: 09/14/2019, 10:49 AM  Clinical Narrative:   Spoke with the patient and explained that [facility] has accepted her and that [employer] will provide a RW and a 3 in 1 to take home with her, she states understanding and is ready to DC    Expected Discharge Plan: [place] Barriers to Discharge: Continued Medical Work up  Expected Discharge Plan and Services Expected Discharge Plan: [place] arrangements for the past 2 months: Single Family Home                 DME Arranged: 3-N-1, [place] rolling DME Agency: [employer] Date DME Agency Contacted: 09/13/19 Time DME Agency Contacted: [phone] Representative spoke with at DME Agency: [place]: PT [place]: Well [place] Date [place]: 09/13/19 Time [place]: 1504 Representative spoke with at [place]: [place] ([place]) Interventions    Readmission Risk Interventions No flowsheet data found.

## 2019-09-14 NOTE — Evaluation (Signed)
Physical Therapy Evaluation Patient Details Name: [name] MRN: [id number] DOB: 10/18/1960 Today's Date: 09/14/2019   History of Present Illness   59 y.o. female with medical history significant for adrenal insufficiency on chronic prednisone therapy, CAD, diastolic CHF, COPD, chronic pain and GERD who presents to the emergency room with a 3-day history of nausea and vomiting that worsened in the day of arrival.  She has associated headache.  Vomiting is nonbloody she reports that she has been eating minimally recently and that vomiting was minimal volume.  Dizziness seems to be provoked by change in position, however she does not describe symptoms fully congruent with BPPV.  Clinical Impression  Pt with no vomiting during PT session, but did have intermittent subjective nausea that did not necessarily associate with positioning, just general movement.  She was able to ambulate ~30 ft with walker but reported feeling that she would start to vomit if she went any further.  Pt states that recently she has been eating very little solid food because of lack of taste and generally feeling poorly.  Pt limited with mobility, strength, ambulation secondary to chronic L hip pain ("I need to have a replacement, don't see the ortho until April") and has needed to use RW the last month+.  She did have some occasional word finding issues, but generally was able to maintain appropriate conversation and was aware of her situation.  Pt reports she has some family and friends that help her with errands, driving to MD appointments, etc but that there is some inconsistency there as many of them have their own issues - once symptoms of N/V improve she should be able to return home with HHPT and regular check ins.     Follow Up Recommendations Home health PT;Supervision - Intermittent(would benefit from QD check-ins)    Equipment Recommendations  None recommended by PT    Recommendations for Other Services        Precautions / Restrictions Precautions Precautions: Fall Restrictions Weight Bearing Restrictions: No      Mobility  Bed Mobility Overal bed mobility: Needs Assistance Bed Mobility: Supine to Sit     Supine to sit: Min guard     General bed mobility comments: Pt slow to get to sitting, had L hip pain with transition but did not need direct assist  Transfers Overall transfer level: Modified independent Equipment used: Rolling walker (2 wheeled)             General transfer comment: Pt again slow during transition but able to rise to standing w/o phyiscal assist  Ambulation/Gait Ambulation/Gait assistance: Min guard Gait Distance (Feet): 30 Feet Assistive device: Rolling walker (2 wheeled)       General Gait Details: Pt hesitant to put much weight through L LE, reliant on walker.  She did improve cadence with increased time but still remained very slow and guarded.  Pt with no vomiting or obvious symptoms, but reports "I'm going to throw up if I keep going."  She was unable to push herself further - vitals remain stable t/o effort  Stairs            Wheelchair Mobility    Modified Rankin (Stroke Patients Only)       Balance Overall balance assessment: Needs assistance Sitting-balance support: No upper extremity supported Sitting balance-Leahy Scale: Fair Sitting balance - Comments: Pt showing resistance to putting a lot of weight on the the L hip in sitting, but able to maintain balance w/o assist  Standing balance support: Bilateral upper extremity supported Standing balance-Leahy Scale: Fair Standing balance comment: highly reliant on the walker, again hesitant to put much weight on the L                             Pertinent Vitals/Pain Pain Assessment: 0-10 Pain Score: 6  Pain Location: L hip pain, minimal head ache    Home Living Family/patient expects to be discharged to:: Private residence Living Arrangements: Alone   Type of  Home: House         Home Equipment: Gilford Rile - 2 wheels;Cane - single point      Prior Function Level of Independence: Independent with assistive device(s)         Comments: Pt report that prior to 1-2 months ago she only rarely needed SPC, but has been using FWW secondary to L hip pain     Hand Dominance        Extremity/Trunk Assessment   Upper Extremity Assessment Upper Extremity Assessment: Generalized weakness(good QofM, equal bilaterally)    Lower Extremity Assessment Lower Extremity Assessment: Generalized weakness(grossly 4-/5 except L hip very pain limited 3-/5)       Communication   Communication: Other (comment)(appropriate conversation, at times had word finding issues)  Cognition Arousal/Alertness: Awake/alert Behavior During Therapy: Restless Overall Cognitive Status: Within Functional Limits for tasks assessed(aware of situation, infrequent confusion/word-finding issues)                                        General Comments General comments (skin integrity, edema, etc.): deferred performing Dix-Hallpike as pt does not endorse symptoms congruent with BPPV (most notedly prolonged length of symptoms and lack of consistent head position causing symptms), had a h/o neck surgeries and made it clear that she was very hesitant to do any laying down has been very uncomfortable for her    Exercises     Assessment/Plan    PT Assessment Patient needs continued PT services  PT Problem List Decreased strength;Decreased range of motion;Decreased balance;Decreased activity tolerance;Decreased mobility;Decreased knowledge of use of DME;Decreased safety awareness;Decreased cognition;Pain       PT Treatment Interventions DME instruction;Gait training;Stair training;Functional mobility training;Therapeutic exercise;Therapeutic activities;Balance training;Cognitive remediation;Patient/family education    PT Goals (Current goals can be found in the Care  Plan section)  Acute Rehab PT Goals Patient Stated Goal: get the nasuea under control PT Goal Formulation: With patient Time For Goal Achievement: 09/28/19 Potential to Achieve Goals: Fair    Frequency Min 2X/week   Barriers to discharge        Co-evaluation               AM-PAC PT "6 Clicks" Mobility  Outcome Measure Help needed turning from your back to your side while in a flat bed without using bedrails?: A Little Help needed moving from lying on your back to sitting on the side of a flat bed without using bedrails?: A Little Help needed moving to and from a bed to a chair (including a wheelchair)?: A Little Help needed standing up from a chair using your arms (e.g., wheelchair or bedside chair)?: A Little Help needed to walk in hospital room?: A Little Help needed climbing 3-5 steps with a railing? : A Lot 6 Click Score: 17    End of Session Equipment Utilized During Treatment: Gait belt  Activity Tolerance: Patient tolerated treatment well Patient left: with chair alarm set;with call bell/phone within reach Nurse Communication: Mobility status PT Visit Diagnosis: Unsteadiness on feet (R26.81);Difficulty in walking, not elsewhere classified (R26.2);Muscle weakness (generalized) (M62.81);Pain Pain - Right/Left: Left Pain - part of body: Hip    Time: 0926-1001 PT Time Calculation (min) (ACUTE ONLY): 35 min   Charges:   PT Evaluation $PT Eval Moderate Complexity: 1 Mod PT Treatments $Gait Training: 8-22 mins        Kreg Shropshire, DPT 09/14/2019, 10:51 AM

## 2019-09-14 NOTE — TOC Progression Note (Signed)
Transition of Care [facility]) - Progression Note    Patient Details  Name: [name] MRN: [id number] Date of Birth: Apr 29, 1961  Transition of Care [facility]) CM/SW [place], RN Phone Number: 09/14/2019, 10:03 AM  Clinical Narrative:   [facility] declined taking the patient due to payor, I requested [facility] to accept the patient they can't due to staffing and it is their charity week I requested [facility] to accept the patient They are unable to accept the patient  I requested [facility] to accept the patient [facility] and they are unable to accept the patient, I requested [facility] to accept the patient for [facility] PT I left a VM for Cassie and am awaiting a call back I called Corene Cornea with [facility] and requested them to accept the patient, He took the information for the referral and will let me know     Expected Discharge Plan: [place] Barriers to Discharge: Continued Medical Work up  Expected Discharge Plan and Services Expected Discharge Plan: [place] arrangements for the past 2 months: Single Family Home                 DME Arranged: 3-N-1, Walker rolling DME Agency: [employer] Date DME Agency Contacted: 09/13/19 Time DME Agency Contacted: [phone] Representative spoke with at DME Agency: [place]: PT [place]: [facility] [place] Date [place]: 09/13/19 Time [place]: [place] Representative spoke with at [place]: [place] ([place]) Interventions    Readmission Risk Interventions No flowsheet data found.

## 2019-09-14 NOTE — Discharge Summary (Signed)
Physician Discharge Summary  [name] [id number] DOB: 19-Jul-1961 DOA: 09/12/2019  PCP: Kristina Roger, MD  Admit date: 09/12/2019 Discharge date: 09/14/2019  Admitted From: Home  Disposition:  Home   Recommendations for Outpatient Follow-up:  1. Follow up with PCP [name] in 1-2 weeks 2. [name]: Please obtain BMP/CBC in 2 weeks     Home Health: PT/OT due to patient's ongoing vertigo preventing leaving home safely  Equipment/Devices: 3-in-1, walker  Discharge Condition: Fair  CODE STATUS: FULL Diet recommendation: BRAT  Brief/Interim Summary: [name] is a 59 y.o. F with COPD not on O2 but on chronic prednisone, secondary adrenal insufficiency, dCHF, CAD, and chronic osteoarthritic pain who presented with intractable nausea and vomiting for 3 days as well as vertigo with head movement.  In the ER, CT head unremarkable.  K 2.9, Cr 1.15 up from baseline 0.84.  CT abdomen and pelvis also normal.  Could not tolerate PO intake, and so hospitalist service were asked to evaluate for admission.     PRINCIPAL HOSPITAL DIAGNOSIS: Intractable vertigo    Discharge Diagnoses:  Intractable nausea and vomiting  Vertigo Patient with a nonspecific viral syndrome and prominent vertigo, presented with intractable nausea and vomiting.  CT abdomen and pelvis was obtained, showed no focal findings.  MRI brain ruled out central vertigo.  Patient's vertigo subsided and her nausea improved concurrently.    Able to advance to solid food and ambulate short distances with PT.  Discharged with home regimen, antiemetics and trial meclizine if tolerable.     AKI Hypokalemia Cr 1.15 at admission, improved to baseline 0.76 with IV fluids.  K improved with supplementation.    Adrenal insufficiency Treated with stress dose steroids, resume home prednisone at discharge.    COPD without flare No wheezing or dyspnea to suggest flare  Chronic diastolic CHF Hold diuretic until oral  intake normalizes.  Mood disorder Continue bupropion.          Discharge Instructions  Discharge Instructions    Discharge instructions   Complete by: As directed    From [name]: You were admitted for Vertigo Like we talked about, we ruled out that your vertigo was from a stroke, which leaves the benign (not dangerous) causes of vertigo.  This refers to the not dangerous things like inner ear problems. There are several different inner ear problems that can lead to vertigo, but they all share two features: Medicines don't help them get better They ALL get better gradually with time.  If you are nauseated:  Take ondansetron 4 mg dissolvable tab up to every 6 hours Take Phenergan 25 mg pill as needed up to every 8 hours  If you want to try meclizine 25 mg up to every 8 hours to suppress the vertigo, you may find that this helds  FOr the next week, do not take your furosemide (until you are eating and drinking normally again, then restart it)  Go see [name] in 2 weeks  For the next few days, eat small meals, multiple times throughout the day.   Make SURE you drink enough fluids.  Stick to bananas, rice, apple sauce, toast, crackers, pretzels, etc   Increase activity slowly   Complete by: As directed      Allergies as of 09/14/2019      Reactions   Bupropion Other (See Comments), Nausea Only, Shortness Of Breath, Nausea And Vomiting   cold sweats, extended release, only can take [place] brand  Issues with extended release medications.   Levaquin [levofloxacin In D5w] Anaphylaxis   Methylprednisolone Acetate Other (See Comments)   Amoxicillin Itching   Codeine Nausea And Vomiting   Cymbalta [duloxetine Hcl] Swelling   Gatifloxacin Hives, Swelling   Other Reaction: Other reaction: Swelling   Guaifenesin & Derivatives Hives, Swelling   Keflex [cephalexin] Other (See Comments)   Unknown   Klonopin [clonazepam] Other (See Comments)   "Makes me have bad  thoughts"   Macrobid [nitrofurantoin Monohyd Macro] Other (See Comments)   "kidneys shuts down" Chills, Fever.   Penicillins Hives   Has patient had a PCN reaction causing immediate rash, facial/tongue/throat swelling, SOB or lightheadedness with hypotension: Yes Has patient had a PCN reaction causing severe rash involving mucus membranes or skin necrosis: No Has patient had a PCN reaction that required hospitalization: No Has patient had a PCN reaction occurring within the last 10 years: No If all of the above answers are "NO", then may proceed with Cephalosporin use.   Sulfa Antibiotics Hives   Amitriptyline Rash   "It makes me want to fall."   Other Rash   cortisol      Medication List    STOP taking these medications   furosemide 80 MG tablet Commonly known as: LASIX   ranitidine 150 MG tablet Commonly known as: ZANTAC     TAKE these medications   Advair Diskus 250-50 MCG/DOSE Aepb Generic drug: Fluticasone-Salmeterol Inhale 1 puff into the lungs every 12 (twelve) hours.   ALPRAZolam 1 MG tablet Commonly known as: XANAX Take 1 mg by mouth 4 (four) times daily.   buPROPion 100 MG 12 hr tablet Commonly known as: WELLBUTRIN SR Take 100 mg by mouth 2 (two) times daily.   Calcium Carbonate-Vitamin D 600-400 MG-UNIT tablet Take 1 tablet by mouth daily.   cyclobenzaprine 10 MG tablet Commonly known as: FLEXERIL Take 10 mg by mouth 3 (three) times daily as needed.   donepezil 10 MG tablet Commonly known as: ARICEPT Take 10 mg by mouth at bedtime.   EPINEPHrine 0.3 mg/0.3 mL Soaj injection Commonly known as: EPI-PEN Inject 0.3 mg into the muscle once.   fluticasone 50 MCG/ACT nasal spray Commonly known as: FLONASE Place 2 sprays into the nose daily.   gabapentin 600 MG tablet Commonly known as: NEURONTIN Take 1 tablet by mouth 3 (three) times daily.   glipiZIDE 5 MG tablet Commonly known as: GLUCOTROL Take 5 mg by mouth daily.   hydrOXYzine 25 MG  tablet Commonly known as: ATARAX/VISTARIL Take 25 mg by mouth 3 (three) times daily as needed for itching.   magnesium oxide 400 MG tablet Commonly known as: MAG-OX Take 400 mg by mouth daily.   meclizine 25 MG tablet Commonly known as: ANTIVERT Take 1 tablet (25 mg total) by mouth 3 (three) times daily as needed for dizziness.   omeprazole 40 MG capsule Commonly known as: PRILOSEC Take 1 capsule by mouth 2 (two) times daily.   ondansetron 4 MG disintegrating tablet Commonly known as: ZOFRAN-ODT Take 1 tablet (4 mg total) by mouth every 8 (eight) hours as needed for nausea or vomiting.   oxyCODONE-acetaminophen 10-325 MG tablet Commonly known as: PERCOCET Take 1 tablet by mouth every 4 (four) hours as needed for pain (pt has to take [place] brand).   potassium chloride SA 20 MEQ tablet Commonly known as: KLOR-CON Take 1 tablet (20 mEq total) by mouth 2 (two) times daily.   predniSONE 1 MG tablet Commonly known as: DELTASONE Take  2 mg by mouth daily with breakfast.   predniSONE 5 MG tablet Commonly known as: DELTASONE Take 15 mg by mouth daily with breakfast.   ProAir HFA 108 (90 Base) MCG/ACT inhaler Generic drug: albuterol Inhale 2 puffs into the lungs every 6 (six) hours as needed.   promethazine 25 MG tablet Commonly known as: PHENERGAN Take 1 tablet (25 mg total) by mouth every 8 (eight) hours as needed for nausea or vomiting.      Follow-up Information    Nona Beck, Corene Cornea, MD. Schedule an appointment as soon as possible for a visit in 2 weeks.   Specialty: Internal Medicine Why: Patient to call for Appt Contact information: [address] [place] [address] [phone]          Allergies  Allergen Reactions  . Bupropion Other (See Comments), Nausea Only, Shortness Of Breath and Nausea And Vomiting    cold sweats, extended release, only can take [facility] brand Issues with extended release medications.  [place] [Levofloxacin In D5w]  Anaphylaxis  . Methylprednisolone Acetate Other (See Comments)  . Amoxicillin Itching  . Codeine Nausea And Vomiting  . Cymbalta [Duloxetine Hcl] Swelling  . Gatifloxacin Hives and Swelling    Other Reaction: Other reaction: Swelling  . Guaifenesin & Derivatives Hives and Swelling  . Keflex [Cephalexin] Other (See Comments)    Unknown  . Klonopin [Clonazepam] Other (See Comments)    "Makes me have bad thoughts"  . Macrobid [employer] Macro] Other (See Comments)    "kidneys shuts down" Chills, Fever.  . Penicillins Hives    Has patient had a PCN reaction causing immediate rash, facial/tongue/throat swelling, SOB or lightheadedness with hypotension: Yes Has patient had a PCN reaction causing severe rash involving mucus membranes or skin necrosis: No Has patient had a PCN reaction that required hospitalization: No Has patient had a PCN reaction occurring within the last 10 years: No If all of the above answers are "NO", then may proceed with Cephalosporin use.  . Sulfa Antibiotics Hives  . Amitriptyline Rash    "It makes me want to fall."  . Other Rash    cortisol    Consultations:     Procedures/Studies: CT Head Wo Contrast  Result Date: 09/12/2019 CLINICAL DATA:  Headache with nausea/vomiting; headache, acute, normal neuro exam. Additional history provided: Dizziness for 2 days. EXAM: CT HEAD WITHOUT CONTRAST TECHNIQUE: Contiguous axial images were obtained from the base of the skull through the vertex without intravenous contrast. COMPARISON:  Brain MRI 12/21/2018, head CT 12/21/2018 FINDINGS: Brain: No evidence of acute intracranial hemorrhage. No demarcated cortical infarction. No evidence of intracranial mass. No midline shift or extra-axial fluid collection. Stable, mild generalized parenchymal atrophy. Redemonstrated cavum septum pellucidum and cavum vergae. Vascular: No hyperdense vessel.  Atherosclerotic calcifications. Skull: Normal. Negative for fracture or  focal lesion. Sinuses/Orbits: Visualized orbits demonstrate no acute abnormality. Postsurgical appearance of the paranasal sinuses. Minimal scattered paranasal sinus mucosal thickening at the imaged levels. No significant mastoid effusion. IMPRESSION: No evidence of acute intracranial abnormality. Stable, mild generalized parenchymal atrophy. Electronically Signed   By: Kellie Simmering DO   On: 09/12/2019 17:33   MR BRAIN WO CONTRAST  Result Date: 09/13/2019 CLINICAL DATA:  Vertigo, central. Additional history provided: Nausea, vomiting and dizziness. EXAM: MRI HEAD WITHOUT CONTRAST TECHNIQUE: Multiplanar, multiecho pulse sequences of the brain and surrounding structures were obtained without intravenous contrast. COMPARISON:  Noncontrast head CT examinations 09/12/2019 and earlier, brain MRI 12/21/2018. FINDINGS: Brain: There  is no evidence of acute infarct. No evidence of intracranial mass. No midline shift or extra-axial fluid collection. No chronic intracranial blood products. There is no significant white matter disease. Mild generalized parenchymal atrophy. Cavum septum pellucidum and cavum vergae. Vascular: Flow voids maintained within the proximal large arterial vessels. Skull and upper cervical spine: No focal marrow lesion. Sinuses/Orbits: Visualized orbits demonstrate no acute abnormality. Postsurgical appearance of the paranasal sinuses. Moderate mucosal thickening within the inferior left maxillary sinus. No significant mastoid effusion. IMPRESSION: No evidence of acute intracranial abnormality, including acute infarction. Mild generalized parenchymal atrophy. Postsurgical changes to the paranasal sinuses. Moderate left maxillary sinus mucosal thickening. Electronically Signed   By: Kellie Simmering DO   On: 09/13/2019 14:27   CT ABDOMEN PELVIS W CONTRAST  Result Date: 09/12/2019 CLINICAL DATA:  Presents vis EMS with n/v and dizzy for 2 days ^162mL OMNIPAQUE IOHEXOL 300 MG/ML SOLNAbd pain, unspecified  abd pain, N/V EXAM: CT ABDOMEN AND PELVIS WITH CONTRAST TECHNIQUE: Multidetector CT imaging of the abdomen and pelvis was performed using the standard protocol following bolus administration of intravenous contrast. CONTRAST:  162mL OMNIPAQUE IOHEXOL 300 MG/ML  SOLN COMPARISON:  CT abdomen 08/31/2017 FINDINGS: Lower chest: Lung bases are clear. Hepatobiliary: No focal hepatic lesion. No biliary duct dilatation. Gallbladder is normal. Common bile duct is normal. Pancreas: Pancreas is normal. No ductal dilatation. No pancreatic inflammation. Spleen: Normal spleen Adrenals/urinary tract: Adrenal glands and kidneys are normal. The ureters and bladder normal. Stomach/Bowel: Stomach, small-bowel and cecum normal. Post appendectomy. Colon and rectosigmoid colon normal. Diverticulosis of the LEFT colon without diverticulitis. Vascular/Lymphatic: Abdominal aorta is normal caliber with atherosclerotic calcification. There is no retroperitoneal or periportal lymphadenopathy. No pelvic lymphadenopathy. Reproductive: Post hysterectomy Other: No free fluid. Musculoskeletal: No aggressive osseous lesion. 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 There is significant inflammation in the soft tissues about the LEFT hip joint. IMPRESSION: 1. No acute findings in the abdomen pelvis. 2. LEFT colon diverticulosis without diverticulitis. 3. Aortic Atherosclerosis (ICD10-I70.0).\ 4. 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 Electronically Signed   By: Suzy Bouchard M.D.   On: 09/12/2019 17:29      Subjective: No confusion, fever, cough, vomiting, chest pain.  Slowly improving. Able to walk 30 feet with PT, then limited with some nuausea, vertigo.  Discharge Exam: Vitals:   09/14/19 0758 09/14/19 1214  BP: (!) 143/86 (!) 141/81  Pulse: 83 91  Resp:  18  Temp: 98.6 F (37 C) 98.6 F (37 C)  SpO2: 97% 100%   Vitals:    09/13/19 1535 09/14/19 0013 09/14/19 0758 09/14/19 1214  BP: 135/80 130/77 (!) 143/86 (!) 141/81  Pulse: 84 82 83 91  Resp:  14  18  Temp: 98.1 F (36.7 C) (!) 97.5 F (36.4 C) 98.6 F (37 C) 98.6 F (37 C)  TempSrc:  Oral Oral Oral  SpO2: 100% 97% 97% 100%  Weight:      Height:        General: Pt is alert, awake, not in acute distress Cardiovascular: RRR, nl S1-S2, no murmurs appreciated.   No LE edema.   Respiratory: Normal respiratory rate and rhythm.  CTAB without rales or wheezes. Abdominal: Abdomen soft and non-tender.  No distension or HSM.   Neuro/Psych: Strength symmetric in upper and lower extremities.  Judgment and insight appear normal.   The results of significant diagnostics from this hospitalization (including imaging, microbiology, ancillary  and laboratory) are listed below for reference.     Microbiology: Recent Results (from the past 240 hour(s))  SARS Coronavirus 2 by RT PCR     Status: None   Collection Time: 09/12/19  9:48 PM  Result Value Ref Range Status   SARS Coronavirus 2 NEGATIVE NEGATIVE Final    Comment: (NOTE) Result indicates the ABSENCE of SARS-CoV-2 RNA in the patient specimen.  The lowest concentration of SARS-CoV-2 viral copies this assay can detect in nasopharyngeal swab specimens is 500 copies / mL.  A negative result does not preclude SARS-CoV-2 infection and should not be used as the sole basis for patient management decisions. A negative result may occur with improper specimen collection / handling, submission of a specimen other than nasopharyngeal swab, presence of viral mutation(s) within the areas targeted by this assay, and inadequate number of viral copies (<500 copies / mL) present.  Negative results must be combined with clinical observations, patient history, and epidemiological information.  The expected result is NEGATIVE.  Patient Fact Sheet:  BlogSelections.co.uk   Provider Fact Sheet:   https://lucas.com/   This test is not yet approved or cleared by the [place] FDA and  has been authorized for  detection and/or diagnosis of SARS-CoV-2 by FDA under an Emergency Use Authorization (EUA).  This EUA will remain in effect (meaning this test can be used) for the duration of  the COVID-19 declaration under Section 564(b)(1) of the Act, 21 U.S.C. section 360bbb-3(b)(1), unless the authorization is terminated or revoked sooner Performed at [facility], [place] [address] [place], [place] [address]      Labs: BNP (last 3 results) No results for input(s): BNP in the last 8760 hours. Basic Metabolic Panel: Recent Labs  Lab 09/12/19 1148 09/12/19 1922 09/13/19 0445 09/14/19 0606  NA 137 138 137 141  K 3.0* 2.9* 3.0* 3.9  CL 89* 95* 97* 109  CO2 31 28 28 24   GLUCOSE 253* 120* 130* 75  BUN 25* 22* 18 14  CREATININE 1.15* 1.04* 0.98 0.76  CALCIUM 9.9 8.9 9.1 9.3   Liver Function Tests: Recent Labs  Lab 09/12/19 1148  AST 23  ALT 23  ALKPHOS 98  BILITOT 1.8*  PROT 8.1  ALBUMIN 4.2   Recent Labs  Lab 09/12/19 1148  LIPASE 18   No results for input(s): AMMONIA in the last 168 hours. CBC: Recent Labs  Lab 09/12/19 1148 09/13/19 0445 09/14/19 0606  WBC 17.3* 9.4 8.1  HGB 14.9 12.8 12.7  HCT 45.2 38.8 39.9  MCV 89.7 91.1 93.4  PLT 356 288 267   Cardiac Enzymes: No results for input(s): CKTOTAL, CKMB, CKMBINDEX, TROPONINI in the last 168 hours. BNP: Invalid input(s): POCBNP CBG: No results for input(s): GLUCAP in the last 168 hours. D-Dimer No results for input(s): DDIMER in the last 72 hours. Hgb A1c No results for input(s): HGBA1C in the last 72 hours. Lipid Profile No results for input(s): CHOL, HDL, LDLCALC, TRIG, CHOLHDL, LDLDIRECT in the last 72 hours. Thyroid function studies No results for input(s): TSH, T4TOTAL, T3FREE, THYROIDAB in the last 72 hours.  Invalid input(s): FREET3 Anemia work up No  results for input(s): VITAMINB12, FOLATE, FERRITIN, TIBC, IRON, RETICCTPCT in the last 72 hours. Urinalysis    Component Value Date/Time   COLORURINE STRAW (A) 09/12/2019 1315   APPEARANCEUR CLEAR (A) 09/12/2019 1315   LABSPEC 1.005 09/12/2019 1315   PHURINE 7.0 09/12/2019 1315   GLUCOSEU NEGATIVE 09/12/2019 1315   HGBUR SMALL (  A) 09/12/2019 1315   BILIRUBINUR NEGATIVE 09/12/2019 1315   KETONESUR 5 (A) 09/12/2019 1315   PROTEINUR NEGATIVE 09/12/2019 1315   NITRITE NEGATIVE 09/12/2019 1315   LEUKOCYTESUR NEGATIVE 09/12/2019 1315   Sepsis Labs Invalid input(s): PROCALCITONIN,  WBC,  LACTICIDVEN Microbiology Recent Results (from the past 240 hour(s))  SARS Coronavirus 2 by RT PCR     Status: None   Collection Time: 09/12/19  9:48 PM  Result Value Ref Range Status   SARS Coronavirus 2 NEGATIVE NEGATIVE Final    Comment: (NOTE) Result indicates the ABSENCE of SARS-CoV-2 RNA in the patient specimen.  The lowest concentration of SARS-CoV-2 viral copies this assay can detect in nasopharyngeal swab specimens is 500 copies / mL.  A negative result does not preclude SARS-CoV-2 infection and should not be used as the sole basis for patient management decisions. A negative result may occur with improper specimen collection / handling, submission of a specimen other than nasopharyngeal swab, presence of viral mutation(s) within the areas targeted by this assay, and inadequate number of viral copies (<500 copies / mL) present.  Negative results must be combined with clinical observations, patient history, and epidemiological information.  The expected result is NEGATIVE.  Patient Fact Sheet:  BlogSelections.co.uk   Provider Fact Sheet:  https://lucas.com/   This test is not yet approved or cleared by the [place] FDA and  has been authorized for  detection and/or diagnosis of SARS-CoV-2 by FDA under an Emergency Use Authorization (EUA).   This EUA will remain in effect (meaning this test can be used) for the duration of  the COVID-19 declaration under Section 564(b)(1) of the Act, 21 U.S.C. section 360bbb-3(b)(1), unless the authorization is terminated or revoked sooner Performed at [facility], [place] [address] [place], [place] [address]      Time coordinating discharge: 35 minutes The [place] controlled substances registry was reviewed for this patient.      SIGNED:   Edwin Dada, MD  [facility] 09/14/2019, 9:12 PM

## 2019-09-14 NOTE — Progress Notes (Signed)
DISCHARGE NOTE:  Pt given discharge instructions, pt verbalized understanding. Pts DME ( walker and BSC) sent with pt. Family providing transportation.

## 2019-09-14 NOTE — TOC Progression Note (Signed)
Transition of Care [facility]) - Progression Note    Patient Details  Name: [name] MRN: [id number] Date of Birth: [birth year]  Transition of [facility]) CM/SW Contact  Su Hilt, RN Phone Number: 09/14/2019, 10:31 AM  Clinical Narrative:   [name] from [facility] called back and said they would take the patient for [facility] PT, I faxed the order and face sheet to [phone]    Expected Discharge Plan: [place] Barriers to Discharge: Continued Medical Work up  Expected Discharge Plan and Services Expected Discharge Plan: [place]       Living arrangements for the past 2 months: Single Family Home                 DME Arranged: 3-N-1, Walker rolling DME Agency: [employer] Date DME Agency Contacted: 09/13/19 Time DME Agency Contacted: [phone] Representative spoke with at DME Agency: [place]: PT [place]: [facility] [place] Date [place]: 09/13/19 Time [place]: 1504 Representative spoke with at [place]: [place] ([place]) Interventions    Readmission Risk Interventions No flowsheet data found.

## 2019-09-18 NOTE — TOC Progression Note (Signed)
Transition of Care [facility]) - Progression Note    Patient Details  Name: [name] MRN: [id number] Date of Birth: 09/11/1960  Transition of Care [facility]) CM/SW [place], RN Phone Number: 09/18/2019, 9:11 AM  Clinical Narrative:  Received a call from [facility] and they notified me that  [facility] care is unable to reach the patient to set up [facility], I provided them with her family's number to try and reach her.      Expected Discharge Plan: [place] Barriers to Discharge: Continued Medical Work up  Expected Discharge Plan and Services Expected Discharge Plan: [place] arrangements for the past 2 months: Single Family Home Expected Discharge Date: 09/14/19               DME Arranged: Berta Minor rolling DME Agency: [employer] Date DME Agency Contacted: 09/13/19 Time DME Agency Contacted: [phone] Representative spoke with at DME Agency: [place]: PT [place]: [facility] Date [place]: 09/13/19 Time [place]: 1504 Representative spoke with at [place]: [place] ([place]) Interventions    Readmission Risk Interventions No flowsheet data found.

## 2019-09-26 ENCOUNTER — Other Ambulatory Visit: Payer: Self-pay | Admitting: Internal Medicine

## 2019-09-26 DIAGNOSIS — M7989 Other specified soft tissue disorders: Secondary | ICD-10-CM

## 2019-09-28 ENCOUNTER — Other Ambulatory Visit: Payer: Self-pay

## 2019-09-28 ENCOUNTER — Ambulatory Visit
Admission: RE | Admit: 2019-09-28 | Discharge: 2019-09-28 | Disposition: A | Discharge status: Home / Self Care | Payer: Medicare Other | Source: Ambulatory Visit | Attending: Internal Medicine | Admitting: Internal Medicine

## 2019-09-28 DIAGNOSIS — M7989 Other specified soft tissue disorders: Secondary | ICD-10-CM | POA: Diagnosis not present

## 2019-09-29 ENCOUNTER — Ambulatory Visit: Payer: Medicare Other

## 2019-11-13 HISTORY — PX: TOTAL HIP ARTHROPLASTY: SHX124

## 2019-11-19 DIAGNOSIS — S0501XA Injury of conjunctiva and corneal abrasion without foreign body, right eye, initial encounter: Secondary | ICD-10-CM | POA: Insufficient documentation

## 2019-12-06 ENCOUNTER — Observation Stay: Payer: Medicare Other | Admitting: Certified Registered"

## 2019-12-06 ENCOUNTER — Observation Stay: Payer: Medicare Other

## 2019-12-06 ENCOUNTER — Other Ambulatory Visit: Payer: Self-pay

## 2019-12-06 ENCOUNTER — Emergency Department: Payer: Medicare Other

## 2019-12-06 ENCOUNTER — Encounter
Admission: EM | Disposition: A | Discharge status: Home / Self Care | Payer: Self-pay | Source: Home / Self Care | Attending: Emergency Medicine

## 2019-12-06 ENCOUNTER — Observation Stay
Admission: EM | Admit: 2019-12-06 | Discharge: 2019-12-07 | Disposition: A | Discharge status: Home / Self Care | Payer: Medicare Other | Attending: Internal Medicine | Admitting: Internal Medicine

## 2019-12-06 DIAGNOSIS — Z7952 Long term (current) use of systemic steroids: Secondary | ICD-10-CM | POA: Diagnosis not present

## 2019-12-06 DIAGNOSIS — I2510 Atherosclerotic heart disease of native coronary artery without angina pectoris: Secondary | ICD-10-CM | POA: Insufficient documentation

## 2019-12-06 DIAGNOSIS — Z880 Allergy status to penicillin: Secondary | ICD-10-CM | POA: Insufficient documentation

## 2019-12-06 DIAGNOSIS — Z79899 Other long term (current) drug therapy: Secondary | ICD-10-CM | POA: Insufficient documentation

## 2019-12-06 DIAGNOSIS — Z20822 Contact with and (suspected) exposure to covid-19: Secondary | ICD-10-CM | POA: Diagnosis not present

## 2019-12-06 DIAGNOSIS — F0390 Unspecified dementia without behavioral disturbance: Secondary | ICD-10-CM | POA: Insufficient documentation

## 2019-12-06 DIAGNOSIS — Z794 Long term (current) use of insulin: Secondary | ICD-10-CM | POA: Insufficient documentation

## 2019-12-06 DIAGNOSIS — M6281 Muscle weakness (generalized): Secondary | ICD-10-CM | POA: Diagnosis not present

## 2019-12-06 DIAGNOSIS — E785 Hyperlipidemia, unspecified: Secondary | ICD-10-CM | POA: Diagnosis not present

## 2019-12-06 DIAGNOSIS — E2740 Unspecified adrenocortical insufficiency: Secondary | ICD-10-CM | POA: Diagnosis not present

## 2019-12-06 DIAGNOSIS — Z8249 Family history of ischemic heart disease and other diseases of the circulatory system: Secondary | ICD-10-CM | POA: Diagnosis not present

## 2019-12-06 DIAGNOSIS — W06XXXA Fall from bed, initial encounter: Secondary | ICD-10-CM | POA: Diagnosis not present

## 2019-12-06 DIAGNOSIS — Z881 Allergy status to other antibiotic agents status: Secondary | ICD-10-CM | POA: Diagnosis not present

## 2019-12-06 DIAGNOSIS — F17210 Nicotine dependence, cigarettes, uncomplicated: Secondary | ICD-10-CM | POA: Diagnosis not present

## 2019-12-06 DIAGNOSIS — I5032 Chronic diastolic (congestive) heart failure: Secondary | ICD-10-CM | POA: Diagnosis not present

## 2019-12-06 DIAGNOSIS — T84021A Dislocation of internal left hip prosthesis, initial encounter: Principal | ICD-10-CM

## 2019-12-06 DIAGNOSIS — Z7951 Long term (current) use of inhaled steroids: Secondary | ICD-10-CM | POA: Insufficient documentation

## 2019-12-06 DIAGNOSIS — Z96642 Presence of left artificial hip joint: Secondary | ICD-10-CM

## 2019-12-06 DIAGNOSIS — Z885 Allergy status to narcotic agent status: Secondary | ICD-10-CM | POA: Insufficient documentation

## 2019-12-06 DIAGNOSIS — S73006A Unspecified dislocation of unspecified hip, initial encounter: Secondary | ICD-10-CM

## 2019-12-06 DIAGNOSIS — K219 Gastro-esophageal reflux disease without esophagitis: Secondary | ICD-10-CM | POA: Insufficient documentation

## 2019-12-06 DIAGNOSIS — S0990XA Unspecified injury of head, initial encounter: Secondary | ICD-10-CM

## 2019-12-06 DIAGNOSIS — Z882 Allergy status to sulfonamides status: Secondary | ICD-10-CM | POA: Insufficient documentation

## 2019-12-06 DIAGNOSIS — Z01818 Encounter for other preprocedural examination: Secondary | ICD-10-CM

## 2019-12-06 DIAGNOSIS — S73005A Unspecified dislocation of left hip, initial encounter: Secondary | ICD-10-CM

## 2019-12-06 DIAGNOSIS — F419 Anxiety disorder, unspecified: Secondary | ICD-10-CM | POA: Diagnosis not present

## 2019-12-06 DIAGNOSIS — J449 Chronic obstructive pulmonary disease, unspecified: Secondary | ICD-10-CM | POA: Diagnosis present

## 2019-12-06 DIAGNOSIS — E119 Type 2 diabetes mellitus without complications: Secondary | ICD-10-CM | POA: Insufficient documentation

## 2019-12-06 DIAGNOSIS — Z833 Family history of diabetes mellitus: Secondary | ICD-10-CM | POA: Diagnosis not present

## 2019-12-06 DIAGNOSIS — Z888 Allergy status to other drugs, medicaments and biological substances status: Secondary | ICD-10-CM | POA: Insufficient documentation

## 2019-12-06 DIAGNOSIS — W19XXXA Unspecified fall, initial encounter: Secondary | ICD-10-CM

## 2019-12-06 DIAGNOSIS — Z09 Encounter for follow-up examination after completed treatment for conditions other than malignant neoplasm: Secondary | ICD-10-CM

## 2019-12-06 DIAGNOSIS — S0101XA Laceration without foreign body of scalp, initial encounter: Secondary | ICD-10-CM

## 2019-12-06 HISTORY — PX: HIP CLOSED REDUCTION: SHX983

## 2019-12-06 LAB — SARS CORONAVIRUS 2 BY RT PCR (HOSPITAL ORDER, PERFORMED IN ~~LOC~~ HOSPITAL LAB): SARS Coronavirus 2: NEGATIVE

## 2019-12-06 LAB — COMPREHENSIVE METABOLIC PANEL
ALT: 22 U/L (ref 0–44)
AST: 25 U/L (ref 15–41)
Albumin: 3.4 g/dL — ABNORMAL LOW (ref 3.5–5.0)
Alkaline Phosphatase: 132 U/L — ABNORMAL HIGH (ref 38–126)
Anion gap: 11 (ref 5–15)
BUN: 26 mg/dL — ABNORMAL HIGH (ref 6–20)
CO2: 34 mmol/L — ABNORMAL HIGH (ref 22–32)
Calcium: 8.9 mg/dL (ref 8.9–10.3)
Chloride: 93 mmol/L — ABNORMAL LOW (ref 98–111)
Creatinine, Ser: 0.82 mg/dL (ref 0.44–1.00)
GFR calc Af Amer: >60 mL/min (ref >60)
GFR calc non Af Amer: >60 mL/min (ref >60)
Glucose, Bld: 119 mg/dL — ABNORMAL HIGH (ref 70–99)
Potassium: 3.5 mmol/L (ref 3.5–5.1)
Sodium: 138 mmol/L (ref 135–145)
Total Bilirubin: 0.6 mg/dL (ref 0.3–1.2)
Total Protein: 6.3 g/dL — ABNORMAL LOW (ref 6.5–8.1)

## 2019-12-06 LAB — CBC
HCT: 31.1 % — ABNORMAL LOW (ref 36.0–46.0)
Hemoglobin: 9.9 g/dL — ABNORMAL LOW (ref 12.0–15.0)
MCH: 29.6 pg (ref 26.0–34.0)
MCHC: 31.8 g/dL (ref 30.0–36.0)
MCV: 92.8 fL (ref 80.0–100.0)
Platelets: 316 10*3/uL (ref 150–400)
RBC: 3.35 MIL/uL — ABNORMAL LOW (ref 3.87–5.11)
RDW: 14.2 % (ref 11.5–15.5)
WBC: 12.8 10*3/uL — ABNORMAL HIGH (ref 4.0–10.5)
nRBC: 0 % (ref 0.0–0.2)

## 2019-12-06 LAB — GLUCOSE, CAPILLARY
Glucose-Capillary: 115 mg/dL — ABNORMAL HIGH (ref 70–99)
Glucose-Capillary: 148 mg/dL — ABNORMAL HIGH (ref 70–99)
Glucose-Capillary: 135 mg/dL — ABNORMAL HIGH (ref 70–99)
Glucose-Capillary: 180 mg/dL — ABNORMAL HIGH (ref 70–99)
Glucose-Capillary: 125 mg/dL — ABNORMAL HIGH (ref 70–99)

## 2019-12-06 LAB — VANCOMYCIN, RANDOM: Vancomycin Rm: 7

## 2019-12-06 LAB — HEMOGLOBIN A1C
Hgb A1c MFr Bld: 5.3 % (ref 4.8–5.6)
Mean Plasma Glucose: 105 mg/dL

## 2019-12-06 SURGERY — CLOSED MANIPULATION, JOINT, HIP
Anesthesia: General | Site: Hip | Laterality: Left

## 2019-12-06 MED ORDER — FENTANYL CITRATE (PF) 100 MCG/2ML IJ SOLN: 50.0000 ug | Freq: Once | INTRAMUSCULAR | Status: DC

## 2019-12-06 MED ORDER — CALCIUM CARBONATE-VITAMIN D 500-200 MG-UNIT PO TABS
1.0000 | ORAL_TABLET | Freq: Every day | ORAL | Status: DC
  Administered 2019-12-06 – 2019-12-07 (×2): 1 via ORAL
  Filled 2019-12-06 (×2): qty 1

## 2019-12-06 MED ORDER — OXYCODONE HCL 5 MG PO TABS
10.0000 mg | ORAL_TABLET | ORAL | Status: DC | PRN
  Administered 2019-12-06: 10 mg via ORAL
  Administered 2019-12-06 – 2019-12-07 (×3): 15 mg via ORAL
  Filled 2019-12-06: qty 3
  Filled 2019-12-06 (×3): qty 2
  Filled 2019-12-06: qty 3

## 2019-12-06 MED ORDER — MENTHOL 3 MG MT LOZG
1.0000 | LOZENGE | OROMUCOSAL | Status: DC | PRN
  Filled 2019-12-06: qty 9

## 2019-12-06 MED ORDER — IPRATROPIUM-ALBUTEROL 0.5-2.5 (3) MG/3ML IN SOLN: 3.0000 mL | RESPIRATORY_TRACT | Status: DC | PRN

## 2019-12-06 MED ORDER — PANTOPRAZOLE SODIUM 40 MG PO TBEC
40.0000 mg | DELAYED_RELEASE_TABLET | Freq: Every day | ORAL | Status: DC
  Administered 2019-12-06 – 2019-12-07 (×2): 40 mg via ORAL
  Filled 2019-12-06 (×2): qty 1

## 2019-12-06 MED ORDER — HYDROCORTISONE NA SUCCINATE PF 100 MG IJ SOLR: 50.0000 mg | Freq: Four times a day (QID) | INTRAMUSCULAR | Status: DC

## 2019-12-06 MED ORDER — MOMETASONE FURO-FORMOTEROL FUM 200-5 MCG/ACT IN AERO
2.0000 | INHALATION_SPRAY | Freq: Two times a day (BID) | RESPIRATORY_TRACT | Status: DC
  Administered 2019-12-06: 2 via RESPIRATORY_TRACT
  Filled 2019-12-06: qty 8.8

## 2019-12-06 MED ORDER — MAGNESIUM OXIDE 400 (241.3 MG) MG PO TABS
400.0000 mg | ORAL_TABLET | Freq: Every day | ORAL | Status: DC
  Administered 2019-12-06 – 2019-12-07 (×2): 400 mg via ORAL
  Filled 2019-12-06 (×2): qty 1

## 2019-12-06 MED ORDER — CHLORHEXIDINE GLUCONATE CLOTH 2 % EX PADS
6.0000 | MEDICATED_PAD | Freq: Every day | CUTANEOUS | Status: DC
  Administered 2019-12-07: 6 via TOPICAL

## 2019-12-06 MED ORDER — HYDROMORPHONE HCL 1 MG/ML IJ SOLN
INTRAMUSCULAR | Status: AC
  Administered 2019-12-06: 0.5 mg via INTRAVENOUS
  Filled 2019-12-06: qty 1

## 2019-12-06 MED ORDER — BISACODYL 10 MG RE SUPP
10.0000 mg | Freq: Every day | RECTAL | Status: DC | PRN
  Filled 2019-12-06: qty 1

## 2019-12-06 MED ORDER — GABAPENTIN 600 MG PO TABS
600.0000 mg | ORAL_TABLET | Freq: Three times a day (TID) | ORAL | Status: DC
  Administered 2019-12-06 – 2019-12-07 (×3): 600 mg via ORAL
  Filled 2019-12-06 (×6): qty 1

## 2019-12-06 MED ORDER — BUPROPION HCL ER (SR) 100 MG PO TB12
100.0000 mg | ORAL_TABLET | Freq: Two times a day (BID) | ORAL | Status: DC
  Administered 2019-12-06 – 2019-12-07 (×2): 100 mg via ORAL
  Filled 2019-12-06 (×4): qty 1

## 2019-12-06 MED ORDER — LACTATED RINGERS IV SOLN: INTRAVENOUS | Status: DC | PRN

## 2019-12-06 MED ORDER — ACETAMINOPHEN 325 MG PO TABS: 325.0000 mg | ORAL_TABLET | Freq: Four times a day (QID) | ORAL | Status: DC | PRN

## 2019-12-06 MED ORDER — LACTATED RINGERS IV SOLN: INTRAVENOUS | Status: DC

## 2019-12-06 MED ORDER — MIDAZOLAM HCL 2 MG/2ML IJ SOLN
INTRAMUSCULAR | Status: DC | PRN
  Administered 2019-12-06: 2 mg via INTRAVENOUS

## 2019-12-06 MED ORDER — POTASSIUM CHLORIDE CRYS ER 20 MEQ PO TBCR
EXTENDED_RELEASE_TABLET | ORAL | Status: AC
  Administered 2019-12-06: 20 meq via ORAL
  Filled 2019-12-06: qty 1

## 2019-12-06 MED ORDER — MAGNESIUM CITRATE PO SOLN
1.0000 | Freq: Once | ORAL | Status: DC | PRN
  Filled 2019-12-06: qty 296

## 2019-12-06 MED ORDER — ONDANSETRON 4 MG PO TBDP
4.0000 mg | ORAL_TABLET | Freq: Three times a day (TID) | ORAL | Status: DC | PRN
  Filled 2019-12-06: qty 1

## 2019-12-06 MED ORDER — ACETAMINOPHEN 500 MG PO TABS
1000.0000 mg | ORAL_TABLET | Freq: Four times a day (QID) | ORAL | Status: AC
  Administered 2019-12-06 – 2019-12-07 (×2): 1000 mg via ORAL
  Filled 2019-12-06 (×3): qty 2

## 2019-12-06 MED ORDER — INSULIN ASPART 100 UNIT/ML ~~LOC~~ SOLN
0.0000 [IU] | Freq: Three times a day (TID) | SUBCUTANEOUS | Status: DC
  Filled 2019-12-06: qty 1

## 2019-12-06 MED ORDER — KETOROLAC TROMETHAMINE 15 MG/ML IJ SOLN
15.0000 mg | Freq: Four times a day (QID) | INTRAMUSCULAR | Status: AC
  Administered 2019-12-06: 15 mg via INTRAVENOUS
  Filled 2019-12-06 (×3): qty 1

## 2019-12-06 MED ORDER — KETOROLAC TROMETHAMINE 15 MG/ML IJ SOLN
INTRAMUSCULAR | Status: AC
  Administered 2019-12-06: 15 mg via INTRAVENOUS
  Filled 2019-12-06: qty 1

## 2019-12-06 MED ORDER — PROMETHAZINE HCL 25 MG PO TABS
25.0000 mg | ORAL_TABLET | Freq: Three times a day (TID) | ORAL | Status: DC | PRN
  Filled 2019-12-06: qty 1

## 2019-12-06 MED ORDER — ALPRAZOLAM 0.5 MG PO TABS
ORAL_TABLET | ORAL | Status: AC
  Filled 2019-12-06: qty 2

## 2019-12-06 MED ORDER — ACETAMINOPHEN 500 MG PO TABS
ORAL_TABLET | ORAL | Status: AC
  Administered 2019-12-06: 1000 mg via ORAL
  Filled 2019-12-06: qty 2

## 2019-12-06 MED ORDER — PROPOFOL 10 MG/ML IV BOLUS
INTRAVENOUS | Status: AC
  Filled 2019-12-06: qty 20

## 2019-12-06 MED ORDER — PHENOL 1.4 % MT LIQD
1.0000 | OROMUCOSAL | Status: DC | PRN
  Filled 2019-12-06: qty 177

## 2019-12-06 MED ORDER — PREDNISONE 10 MG PO TABS
15.0000 mg | ORAL_TABLET | Freq: Every day | ORAL | Status: DC
  Administered 2019-12-07: 15 mg via ORAL
  Filled 2019-12-06: qty 1
  Filled 2019-12-06: qty 2

## 2019-12-06 MED ORDER — ALBUTEROL SULFATE (2.5 MG/3ML) 0.083% IN NEBU
3.0000 mL | INHALATION_SOLUTION | Freq: Four times a day (QID) | RESPIRATORY_TRACT | Status: DC | PRN
  Filled 2019-12-06: qty 3

## 2019-12-06 MED ORDER — ONDANSETRON HCL 4 MG/2ML IJ SOLN: 4.0000 mg | Freq: Four times a day (QID) | INTRAMUSCULAR | Status: DC | PRN

## 2019-12-06 MED ORDER — ONDANSETRON HCL 4 MG PO TABS: 4.0000 mg | ORAL_TABLET | Freq: Four times a day (QID) | ORAL | Status: DC | PRN

## 2019-12-06 MED ORDER — HYDROMORPHONE HCL 1 MG/ML IJ SOLN: 0.5000 mg | INTRAMUSCULAR | Status: DC | PRN

## 2019-12-06 MED ORDER — HYDROXYZINE HCL 25 MG PO TABS
25.0000 mg | ORAL_TABLET | Freq: Three times a day (TID) | ORAL | Status: DC | PRN
  Filled 2019-12-06: qty 1

## 2019-12-06 MED ORDER — DOCUSATE SODIUM 100 MG PO CAPS
100.0000 mg | ORAL_CAPSULE | Freq: Two times a day (BID) | ORAL | Status: DC
  Administered 2019-12-06: 100 mg via ORAL
  Filled 2019-12-06 (×3): qty 1

## 2019-12-06 MED ORDER — SODIUM CHLORIDE 0.9 % IV SOLN: INTRAVENOUS | Status: DC

## 2019-12-06 MED ORDER — OXYCODONE HCL 5 MG PO TABS
5.0000 mg | ORAL_TABLET | ORAL | Status: DC | PRN
  Filled 2019-12-06: qty 1

## 2019-12-06 MED ORDER — HYDROMORPHONE HCL 1 MG/ML IJ SOLN
0.2500 mg | INTRAMUSCULAR | Status: DC | PRN
  Administered 2019-12-06: 0.5 mg via INTRAVENOUS

## 2019-12-06 MED ORDER — SUCCINYLCHOLINE CHLORIDE 20 MG/ML IJ SOLN
INTRAMUSCULAR | Status: DC | PRN
Start: 2019-12-06 — End: 2019-12-06
  Administered 2019-12-06: 60 mg via INTRAVENOUS

## 2019-12-06 MED ORDER — MAGNESIUM HYDROXIDE 400 MG/5ML PO SUSP
30.0000 mL | Freq: Every day | ORAL | Status: DC | PRN
  Filled 2019-12-06: qty 30

## 2019-12-06 MED ORDER — FENTANYL CITRATE (PF) 100 MCG/2ML IJ SOLN: 25.0000 ug | INTRAMUSCULAR | Status: DC | PRN

## 2019-12-06 MED ORDER — PROPOFOL 10 MG/ML IV BOLUS
INTRAVENOUS | Status: DC | PRN
  Administered 2019-12-06 (×2): 30 mg via INTRAVENOUS
  Administered 2019-12-06: 80 mg via INTRAVENOUS
  Administered 2019-12-06: 30 mg via INTRAVENOUS
  Administered 2019-12-06: 50 mg via INTRAVENOUS
  Administered 2019-12-06: 30 mg via INTRAVENOUS

## 2019-12-06 MED ORDER — HYDROCODONE-ACETAMINOPHEN 5-325 MG PO TABS: 1.0000 | ORAL_TABLET | Freq: Four times a day (QID) | ORAL | Status: DC | PRN

## 2019-12-06 MED ORDER — CYCLOBENZAPRINE HCL 10 MG PO TABS
10.0000 mg | ORAL_TABLET | Freq: Three times a day (TID) | ORAL | Status: DC | PRN
  Administered 2019-12-06 – 2019-12-07 (×2): 10 mg via ORAL
  Filled 2019-12-06 (×2): qty 1

## 2019-12-06 MED ORDER — VANCOMYCIN HCL IN DEXTROSE 1-5 GM/200ML-% IV SOLN
1000.0000 mg | Freq: Two times a day (BID) | INTRAVENOUS | Status: DC
  Administered 2019-12-06 – 2019-12-07 (×2): 1000 mg via INTRAVENOUS
  Filled 2019-12-06 (×4): qty 200

## 2019-12-06 MED ORDER — EPINEPHRINE 0.3 MG/0.3ML IJ SOAJ
0.3000 mg | Freq: Once | INTRAMUSCULAR | Status: DC
  Filled 2019-12-06: qty 0.6

## 2019-12-06 MED ORDER — METOCLOPRAMIDE HCL 5 MG/ML IJ SOLN: 5.0000 mg | Freq: Three times a day (TID) | INTRAMUSCULAR | Status: DC | PRN

## 2019-12-06 MED ORDER — HYDROCORTISONE NA SUCCINATE PF 100 MG IJ SOLR
INTRAMUSCULAR | Status: AC
  Filled 2019-12-06: qty 2

## 2019-12-06 MED ORDER — DONEPEZIL HCL 5 MG PO TABS
10.0000 mg | ORAL_TABLET | Freq: Every day | ORAL | Status: DC
  Administered 2019-12-06: 10 mg via ORAL
  Filled 2019-12-06: qty 2

## 2019-12-06 MED ORDER — GABAPENTIN 300 MG PO CAPS
ORAL_CAPSULE | ORAL | Status: AC
  Administered 2019-12-06: 600 mg
  Filled 2019-12-06: qty 2

## 2019-12-06 MED ORDER — MECLIZINE HCL 25 MG PO TABS
25.0000 mg | ORAL_TABLET | Freq: Three times a day (TID) | ORAL | Status: DC | PRN
  Filled 2019-12-06: qty 1

## 2019-12-06 MED ORDER — ALUM & MAG HYDROXIDE-SIMETH 200-200-20 MG/5ML PO SUSP: 30.0000 mL | ORAL | Status: DC | PRN

## 2019-12-06 MED ORDER — ALPRAZOLAM 0.5 MG PO TABS
1.0000 mg | ORAL_TABLET | Freq: Four times a day (QID) | ORAL | Status: DC
  Administered 2019-12-06 – 2019-12-07 (×3): 1 mg via ORAL
  Filled 2019-12-06 (×4): qty 2

## 2019-12-06 MED ORDER — OXYCODONE-ACETAMINOPHEN 10-325 MG PO TABS: 1.0000 | ORAL_TABLET | ORAL | Status: DC | PRN

## 2019-12-06 MED ORDER — MORPHINE SULFATE (PF) 2 MG/ML IV SOLN: 0.5000 mg | INTRAVENOUS | Status: DC | PRN

## 2019-12-06 MED ORDER — METOCLOPRAMIDE HCL 10 MG PO TABS: 5.0000 mg | ORAL_TABLET | Freq: Three times a day (TID) | ORAL | Status: DC | PRN

## 2019-12-06 MED ORDER — GLIPIZIDE 5 MG PO TABS
5.0000 mg | ORAL_TABLET | Freq: Every day | ORAL | Status: DC
  Filled 2019-12-06 (×2): qty 1

## 2019-12-06 MED ORDER — HYDROCORTISONE NA SUCCINATE PF 100 MG IJ SOLR
50.0000 mg | Freq: Once | INTRAMUSCULAR | Status: AC
  Administered 2019-12-06: 50 mg via INTRAVENOUS

## 2019-12-06 MED ORDER — MIDAZOLAM HCL 2 MG/2ML IJ SOLN
INTRAMUSCULAR | Status: AC
  Filled 2019-12-06: qty 2

## 2019-12-06 MED ORDER — POTASSIUM CHLORIDE CRYS ER 20 MEQ PO TBCR
20.0000 meq | EXTENDED_RELEASE_TABLET | Freq: Two times a day (BID) | ORAL | Status: DC
  Administered 2019-12-06 – 2019-12-07 (×2): 20 meq via ORAL
  Filled 2019-12-06 (×2): qty 1

## 2019-12-06 SURGICAL SUPPLY — 2 items
DRSG TEGADERM 4X4.75 (GAUZE/BANDAGES/DRESSINGS) ×1 IMPLANT
PILLOW ABDUC SM (MISCELLANEOUS) ×1 IMPLANT

## 2019-12-06 NOTE — Consult Note (Addendum)
ORTHOPAEDIC CONSULTATION  REQUESTING PHYSICIAN: Lorella Nimrod, MD  Chief Complaint: left hip pain  HPI: [name] is a 59 y.o. female who complains of left hip pain after fall. Please see H&P and ED notes for details. Denies any numbness, tingling or constitutional symptoms. Her primary left hip replacement was 11/03/2019 with Dr. Jacqulynn Cadet at [facility] and was complicated by positive intra-operative cultures for Staph. She is currently on Vancomycin.  Past Medical History:  Diagnosis Date  . Adrenal insufficiency ([place])   . Anxiety   . CAD (coronary artery disease)   . Cancer ([place])    skin  . Chronic diastolic CHF (congestive heart failure) ([place])   . Chronic pain   . COPD (chronic obstructive pulmonary disease) ([place])   . Cushing disease ([place])   . Dementia ([place])   . GERD (gastroesophageal reflux disease)   . HLD (hyperlipidemia)    Past Surgical History:  Procedure Laterality Date  . APPENDECTOMY    . NASAL SEPTUM SURGERY    . NASAL SINUS SURGERY    . UMBILICAL HERNIA REPAIR    . VAGINAL HYSTERECTOMY     Social History   Socioeconomic History  . Marital status: Single    Spouse name: Not on file  . Number of children: Not on file  . Years of education: Not on file  . Highest education level: Not on file  Occupational History  . Not on file  Tobacco Use  . Smoking status: Current Every Day Smoker    Packs/day: 0.50    Types: Cigarettes  . Smokeless tobacco: Never Used  Substance and Sexual Activity  . Alcohol use: Yes    Alcohol/week: 0.0 standard drinks    Comment: rarely  . Drug use: No  . Sexual activity: Not on file  Other Topics Concern  . Not on file  Social History Narrative  . Not on file   Social Determinants of Health   Financial Resource Strain:   . Difficulty of Paying Living Expenses:   Food Insecurity:   . Worried About Charity fundraiser in the Last Year:   . Arboriculturist in the Last Year:   Transportation Needs:   . Lexicographer (Medical):   Marland Kitchen Lack of Transportation (Non-Medical):   Physical Activity:   . Days of Exercise per Week:   . Minutes of Exercise per Session:   Stress:   . Feeling of Stress :   Social Connections:   . Frequency of Communication with Friends and Family:   . Frequency of Social Gatherings with Friends and Family:   . Attends Religious Services:   . Active Member of Clubs or Organizations:   . Attends Archivist Meetings:   Marland Kitchen Marital Status:    Family History  Problem Relation Age of Onset  . Ovarian cancer Mother   . Breast cancer Mother   . Brain cancer Mother   . CAD Father   . Diabetes Father   . Hepatitis Father   . Liver disease Father   . Cancer Father   . Alcohol abuse Father   . Heart attack Brother   . Sleep apnea Brother   . Diabetes Brother   . ALS Paternal Grandmother   . Stroke Maternal Grandmother    Allergies  Allergen Reactions  . Bupropion Other (See Comments), Nausea Only, Shortness Of Breath and Nausea And Vomiting    cold sweats, extended release, only can take [place] brand Issues with extended  release medications. Other reaction(s): Unknown ER- cold sweats, extended release, only can take [facility] brand Issues with extended release medications.  Mack Hook [Levofloxacin In D5w] Anaphylaxis  . Methylprednisolone Acetate Other (See Comments)  . Amoxicillin Itching  . Codeine Nausea And Vomiting  . Cymbalta [Duloxetine Hcl] Swelling  . Gatifloxacin Hives and Swelling    Other Reaction: Other reaction: Swelling  . Guaifenesin & Derivatives Hives and Swelling  . Keflex [Cephalexin] Other (See Comments)    Unknown  . Klonopin [Clonazepam] Other (See Comments)    "Makes me have bad thoughts"  . Macrobid [employer] Macro] Other (See Comments)    "kidneys shuts down" Chills, Fever.  . Penicillins Hives    Has patient had a PCN reaction causing immediate rash, facial/tongue/throat swelling, SOB or lightheadedness  with hypotension: Yes Has patient had a PCN reaction causing severe rash involving mucus membranes or skin necrosis: No Has patient had a PCN reaction that required hospitalization: No Has patient had a PCN reaction occurring within the last 10 years: No If all of the above answers are "NO", then may proceed with Cephalosporin use.  . Sulfa Antibiotics Hives  . Amitriptyline Rash    "It makes me want to fall."  . Other Rash    cortisol   Prior to Admission medications   Medication Sig Start Date End Date Taking? Authorizing Provider  albuterol (PROAIR HFA) 108 (90 BASE) MCG/ACT inhaler Inhale 2 puffs into the lungs every 6 (six) hours as needed. 06/04/15   [provider]  ALPRAZolam Duanne Moron) 1 MG tablet Take 1 mg by mouth 4 (four) times daily.     [provider]  buPROPion (WELLBUTRIN SR) 100 MG 12 hr tablet Take 100 mg by mouth 2 (two) times daily. 08/16/19   [provider]  Calcium Carbonate-Vitamin D 600-400 MG-UNIT tablet Take 1 tablet by mouth daily. 05/22/15 09/07/17  [provider]  cyclobenzaprine (FLEXERIL) 10 MG tablet Take 10 mg by mouth 3 (three) times daily as needed.     [provider]  donepezil (ARICEPT) 10 MG tablet Take 10 mg by mouth at bedtime.    [provider]  EPINEPHrine 0.3 mg/0.3 mL IJ SOAJ injection Inject 0.3 mg into the muscle once. 04/01/18   [provider]  fluticasone (FLONASE) 50 MCG/ACT nasal spray Place 2 sprays into the nose daily. 01/02/13   [provider]  Fluticasone-Salmeterol (ADVAIR DISKUS) 250-50 MCG/DOSE AEPB Inhale 1 puff into the lungs every 12 (twelve) hours. 06/04/15 09/07/17  [provider]  gabapentin (NEURONTIN) 600 MG tablet Take 1 tablet by mouth 3 (three) times daily.    [provider]  glipiZIDE (GLUCOTROL) 5 MG tablet Take 5 mg by mouth daily. 08/16/19   [provider]  hydrOXYzine (ATARAX/VISTARIL) 25 MG tablet Take 25 mg by mouth 3  (three) times daily as needed for itching. 03/23/18   [provider]  magnesium oxide (MAG-OX) 400 MG tablet Take 400 mg by mouth daily.    [provider]  meclizine (ANTIVERT) 25 MG tablet Take 1 tablet (25 mg total) by mouth 3 (three) times daily as needed for dizziness. 09/14/19   Danford, Suann Larry, MD  omeprazole (PRILOSEC) 40 MG capsule Take 1 capsule by mouth 2 (two) times daily. 02/19/15 09/07/17  [provider]  ondansetron (ZOFRAN-ODT) 4 MG disintegrating tablet Take 1 tablet (4 mg total) by mouth every 8 (eight) hours as needed for nausea or vomiting. 09/14/19   Danford, Suann Larry,  MD  oxyCODONE-acetaminophen (PERCOCET) 10-325 MG tablet Take 1 tablet by mouth every 4 (four) hours as needed for pain (pt has to take [place] brand).  10/05/12   [provider]  potassium chloride SA (K-DUR,KLOR-CON) 20 MEQ tablet Take 1 tablet (20 mEq total) by mouth 2 (two) times daily. 09/09/17   Epifanio Lesches, MD  predniSONE (DELTASONE) 1 MG tablet Take 2 mg by mouth daily with breakfast.    [provider]  predniSONE (DELTASONE) 5 MG tablet Take 15 mg by mouth daily with breakfast.    [provider]  promethazine (PHENERGAN) 25 MG tablet Take 1 tablet (25 mg total) by mouth every 8 (eight) hours as needed for nausea or vomiting. 09/14/19   Danford, Suann Larry, MD   CT Head Wo Contrast  Result Date: 12/06/2019 CLINICAL DATA:  Head trauma EXAM: CT HEAD WITHOUT CONTRAST TECHNIQUE: Contiguous axial images were obtained from the base of the skull through the vertex without intravenous contrast. COMPARISON:  MRI 09/13/2019, CT brain 09/12/2019 FINDINGS: Brain: No acute territorial infarction, hemorrhage or intracranial mass. Mild atrophy. Stable ventricle size Vascular: No hyperdense vessels.  Carotid vascular calcification Skull: Normal. Negative for fracture or focal lesion. Sinuses/Orbits: Postsurgical changes of the maxillary and ethmoid  sinuses with mild residual thickening Other: Left frontal scalp laceration IMPRESSION: 1. No CT evidence for acute intracranial abnormality. 2. Atrophy Electronically Signed   By: Donavan Foil M.D.   On: 12/06/2019 03:24   DG Chest Port 1 View  Result Date: 12/06/2019 CLINICAL DATA:  Preop. EXAM: PORTABLE CHEST 1 VIEW COMPARISON:  07/09/2018 FINDINGS: Right-sided PICC with tip at the upper cavoatrial junction. There is no edema, consolidation, effusion, or pneumothorax. Normal heart size and mediastinal contours. Trachea is deviated to the left at the lower neck, likely from chin positioning. IMPRESSION: No evidence of active disease. Electronically Signed   By: Monte Fantasia M.D.   On: 12/06/2019 04:21   DG HIP UNILAT WITH PELVIS 2-3 VIEWS LEFT  Result Date: 12/06/2019 CLINICAL DATA:  Left hip pain after fall EXAM: DG HIP (WITH OR WITHOUT PELVIS) 2-3V LEFT COMPARISON:  CT 09/12/2019 FINDINGS: SI joints are non widened. Pubic symphysis and rami appear intact. Patient is status post left hip replacement. There is cranial dislocation of the femoral component with respect to the acetabular cup. No definitive fracture is visualized. IMPRESSION: Status post left hip replacement with superior dislocation of the left femoral component with respect to the acetabular cup Electronically Signed   By: Donavan Foil M.D.   On: 12/06/2019 03:26    Positive ROS: All other systems have been reviewed and were otherwise negative with the exception of those mentioned in the HPI and as above.  Physical Exam: General: Alert, no acute distress Cardiovascular: No pedal edema Respiratory: No cyanosis, no use of accessory musculature GI: No organomegaly, abdomen is soft and non-tender Skin: No lesions in the area of chief complaint Neurologic: Sensation intact distally Psychiatric: Patient is competent for consent with normal mood and affect Lymphatic: No axillary or cervical lymphadenopathy  MUSCULOSKELETAL: left  leg short, externally rotated. Compartments soft. Good cap refill. Motor and sensory intact distally.  Assessment: Closed dislocation of left total hip  Plan: Plan closed reduction of left hip.   The diagnosis, risks, benefits and alternatives to treatment are all discussed in detail with the patient and family. Risks include but are not limited to bleeding, infection, deep vein thrombosis, pulmonary embolism, nerve or vascular injury, non-union, repeat operation, persistent pain,  weakness, stiffness and death. She understands and is eager to proceed.     Lovell Sheehan, MD    12/06/2019 6:29 AM      Full consult note to follow. Patient with dislocated left hip after replacement at [facility] in April of this year. Plan relocation with sedation.

## 2019-12-06 NOTE — Progress Notes (Signed)
Pharmacy Antibiotic Note  [name] is a 59 y.o. female admitted on 12/06/2019 with fall s/p total hip replacement on 04/26 w/ subsequent OR hip cx that grew coag negative staph at [facility]. ID decided to start patient on outpatient abx for 6 weeks via PICC to start 04/29 til 06/10.  Pharmacy has been consulted for vancomycin dosing.  Plan: Patient's last dose of vancomycin is unclear, also not stated on med rec, as a result ordering a random vanc level to assess.  Patient was placed on a dose of vanc 1g IV q12h given: Ke 0.0647 T1/2 ~ 12 hrs Goal trough 15 - 20 mcg/mL  If random level < 20 mcg/mL and stable renal function stable will restart vanc 1g IV q12h   Height: 5\' 4"  (162.6 cm) Weight: 69.9 kg (154 lb) IBW/kg (Calculated) : 54.7  Temp (24hrs), Avg:98 F (36.7 C), Min:96.6 F (35.9 C), Max:98.7 F (37.1 C)  Recent Labs  Lab 12/06/19 0319 12/06/19 0410  WBC  --  12.8*  CREATININE 0.82  --     Estimated Creatinine Clearance: 72.7 mL/min (by C-G formula based on SCr of 0.82 mg/dL).    Allergies  Allergen Reactions  . Bupropion Other (See Comments), Nausea Only, Shortness Of Breath and Nausea And Vomiting    cold sweats, extended release, only can take [facility] brand Issues with extended release medications. Other reaction(s): Unknown ER- cold sweats, extended release, only can take [place] brand Issues with extended release medications.  Mack Hook [Levofloxacin In D5w] Anaphylaxis  . Methylprednisolone Acetate Other (See Comments)  . Amoxicillin Itching  . Codeine Nausea And Vomiting  . Cymbalta [Duloxetine Hcl] Swelling  . Gatifloxacin Hives and Swelling    Other Reaction: Other reaction: Swelling  . Guaifenesin & Derivatives Hives and Swelling  . Keflex [Cephalexin] Other (See Comments)    Unknown  . Klonopin [Clonazepam] Other (See Comments)    "Makes me have bad thoughts"  . Macrobid [employer] Macro] Other (See Comments)    "kidneys shuts down"  Chills, Fever.  . Penicillins Hives    Has patient had a PCN reaction causing immediate rash, facial/tongue/throat swelling, SOB or lightheadedness with hypotension: Yes Has patient had a PCN reaction causing severe rash involving mucus membranes or skin necrosis: No Has patient had a PCN reaction that required hospitalization: No Has patient had a PCN reaction occurring within the last 10 years: No If all of the above answers are "NO", then may proceed with Cephalosporin use.  . Sulfa Antibiotics Hives  . Amitriptyline Rash    "It makes me want to fall."  . Other Rash    cortisol    Thank you for allowing pharmacy to be a part of this patient's care.  Tobie Lords, PharmD, BCPS Clinical Pharmacist 12/06/2019 7:09 AM

## 2019-12-06 NOTE — ED Notes (Signed)
Attempt at IV unsuccessful. Pt to CT.

## 2019-12-06 NOTE — H&P (Signed)
History and Physical    [name] [id number] DOB: [birth year] DOA: 12/06/2019  PCP: Townsend Roger, MD   Patient coming from: Home  I have personally briefly reviewed patient's old medical records in [place]  Chief Complaint: Fall  HPI: [name] is a 59 y.o. female with medical history significant for Adrenal insufficiency on chronic prednisone, diastolic heart failure, COPD, diabetes, dementia who is status post recent left hip replacement April 2021, who presents to the emergency room following an accidental fall onto her left side striking her left hip and head.  States she was getting off of her bed and accidentally tripped on the dog's bed that was beside her bed and fell.  Denies loss of consciousness.  She denied preceding lightheadedness, chest pain, palpitations, visual disturbance or numbness or weakness on one side.  She sustained a laceration to the left scalp and was unable to get up on her own after the fall.  ED Course: Upon arrival she was noted to have a 6 cm laceration left scalp which was stapled.  Vitals were within normal limits.  Blood work unremarkable.  Hip x-ray shows superior dislocation of the left femoral component of the hip replacement.  Head CT report pending at admission but preliminary ER reading showed no acute abnormality.  The ER provider spoke with orthopedist Dr. Harlow Mares who will take patient to the OR for relocation of the hip.  Hospitalist consulted for admission  Review of Systems: As per HPI otherwise 10 point review of systems negative.    Past Medical History:  Diagnosis Date  . Adrenal insufficiency ([place])   . Anxiety   . CAD (coronary artery disease)   . Cancer ([place])    skin  . Chronic diastolic CHF (congestive heart failure) ([place])   . Chronic pain   . COPD (chronic obstructive pulmonary disease) ([place])   . Cushing disease ([place])   . Dementia ([place])   . GERD (gastroesophageal reflux disease)   . HLD (hyperlipidemia)      Past Surgical History:  Procedure Laterality Date  . APPENDECTOMY    . NASAL SEPTUM SURGERY    . NASAL SINUS SURGERY    . UMBILICAL HERNIA REPAIR    . VAGINAL HYSTERECTOMY       reports that she has been smoking cigarettes. She has been smoking about 0.50 packs per day. She has never used smokeless tobacco. She reports current alcohol use. She reports that she does not use drugs.  Allergies  Allergen Reactions  . Bupropion Other (See Comments), Nausea Only, Shortness Of Breath and Nausea And Vomiting    cold sweats, extended release, only can take [facility] brand Issues with extended release medications. Other reaction(s): Unknown ER- cold sweats, extended release, only can take [facility] brand Issues with extended release medications.  Mack Hook [Levofloxacin In D5w] Anaphylaxis  . Methylprednisolone Acetate Other (See Comments)  . Amoxicillin Itching  . Codeine Nausea And Vomiting  . Cymbalta [Duloxetine Hcl] Swelling  . Gatifloxacin Hives and Swelling    Other Reaction: Other reaction: Swelling  . Guaifenesin & Derivatives Hives and Swelling  . Keflex [Cephalexin] Other (See Comments)    Unknown  . Klonopin [Clonazepam] Other (See Comments)    "Makes me have bad thoughts"  . Macrobid [employer] Macro] Other (See Comments)    "kidneys shuts down" Chills, Fever.  . Penicillins Hives    Has patient had a PCN reaction causing immediate rash, facial/tongue/throat swelling, SOB or lightheadedness with  hypotension: Yes Has patient had a PCN reaction causing severe rash involving mucus membranes or skin necrosis: No Has patient had a PCN reaction that required hospitalization: No Has patient had a PCN reaction occurring within the last 10 years: No If all of the above answers are "NO", then may proceed with Cephalosporin use.  . Sulfa Antibiotics Hives  . Amitriptyline Rash    "It makes me want to fall."  . Other Rash    cortisol    Family History  Problem  Relation Age of Onset  . Ovarian cancer Mother   . Breast cancer Mother   . Brain cancer Mother   . CAD Father   . Diabetes Father   . Hepatitis Father   . Liver disease Father   . Cancer Father   . Alcohol abuse Father   . Heart attack Brother   . Sleep apnea Brother   . Diabetes Brother   . ALS Paternal Grandmother   . Stroke Maternal Grandmother      Prior to Admission medications   Medication Sig Start Date End Date Taking? Authorizing Provider  albuterol (PROAIR HFA) 108 (90 BASE) MCG/ACT inhaler Inhale 2 puffs into the lungs every 6 (six) hours as needed. 06/04/15   [provider]  ALPRAZolam Duanne Moron) 1 MG tablet Take 1 mg by mouth 4 (four) times daily.     [provider]  buPROPion (WELLBUTRIN SR) 100 MG 12 hr tablet Take 100 mg by mouth 2 (two) times daily. 08/16/19   [provider]  Calcium Carbonate-Vitamin D 600-400 MG-UNIT tablet Take 1 tablet by mouth daily. 05/22/15 09/07/17  [provider]  cyclobenzaprine (FLEXERIL) 10 MG tablet Take 10 mg by mouth 3 (three) times daily as needed.     [provider]  donepezil (ARICEPT) 10 MG tablet Take 10 mg by mouth at bedtime.    [provider]  EPINEPHrine 0.3 mg/0.3 mL IJ SOAJ injection Inject 0.3 mg into the muscle once. 04/01/18   [provider]  fluticasone (FLONASE) 50 MCG/ACT nasal spray Place 2 sprays into the nose daily. 01/02/13   [provider]  Fluticasone-Salmeterol (ADVAIR DISKUS) 250-50 MCG/DOSE AEPB Inhale 1 puff into the lungs every 12 (twelve) hours. 06/04/15 09/07/17  [provider]  gabapentin (NEURONTIN) 600 MG tablet Take 1 tablet by mouth 3 (three) times daily.    [provider]  glipiZIDE (GLUCOTROL) 5 MG tablet Take 5 mg by mouth daily. 08/16/19   [provider]  hydrOXYzine (ATARAX/VISTARIL) 25 MG tablet Take 25 mg by mouth 3 (three) times daily as needed for itching. 03/23/18   [provider]   magnesium oxide (MAG-OX) 400 MG tablet Take 400 mg by mouth daily.    [provider]  meclizine (ANTIVERT) 25 MG tablet Take 1 tablet (25 mg total) by mouth 3 (three) times daily as needed for dizziness. 09/14/19   Danford, Suann Larry, MD  omeprazole (PRILOSEC) 40 MG capsule Take 1 capsule by mouth 2 (two) times daily. 02/19/15 09/07/17  [provider]  ondansetron (ZOFRAN-ODT) 4 MG disintegrating tablet Take 1 tablet (4 mg total) by mouth every 8 (eight) hours as needed for nausea or vomiting. 09/14/19   Danford, Suann Larry, MD  oxyCODONE-acetaminophen (PERCOCET) 10-325 MG tablet Take 1 tablet by mouth every 4 (four) hours as needed for pain (pt has to take [facility] brand).  10/05/12   [provider]  potassium chloride SA (K-DUR,KLOR-CON) 20 MEQ tablet Take 1 tablet (20  mEq total) by mouth 2 (two) times daily. 09/09/17   Epifanio Lesches, MD  predniSONE (DELTASONE) 1 MG tablet Take 2 mg by mouth daily with breakfast.    [provider]  predniSONE (DELTASONE) 5 MG tablet Take 15 mg by mouth daily with breakfast.    [provider]  promethazine (PHENERGAN) 25 MG tablet Take 1 tablet (25 mg total) by mouth every 8 (eight) hours as needed for nausea or vomiting. 09/14/19   Edwin Dada, MD    Physical Exam: Vitals:   12/06/19 0206 12/06/19 0230 12/06/19 0300  BP:  126/61 117/67  Pulse: (!) 107  (!) 110  Resp: 16 (!) 22   Temp: 98.7 F (37.1 C)    TempSrc: Oral    SpO2:   99%  Weight: 69.9 kg    Height: 5\' 4"  (1.626 m)       Vitals:   12/06/19 0206 12/06/19 0230 12/06/19 0300  BP:  126/61 117/67  Pulse: (!) 107  (!) 110  Resp: 16 (!) 22   Temp: 98.7 F (37.1 C)    TempSrc: Oral    SpO2:   99%  Weight: 69.9 kg    Height: 5\' 4"  (1.626 m)      Constitutional: Alert and awake, oriented x3, not in any acute distress. Eyes: PERLA, EOMI, irises appear normal, anicteric sclera,  ENMT: external ears and nose appear  normal, normal hearing             Lips appears normal, oropharynx mucosa, tongue, posterior pharynx appear normal  Neck: neck appears normal, no masses, normal ROM, no thyromegaly, no JVD  CVS: S1-S2 clear, no murmur rubs or gallops,  , no carotid bruits, pedal pulses palpable, No LE edema Respiratory:  clear to auscultation bilaterally, no wheezing, rales or rhonchi. Respiratory effort normal. No accessory muscle use.  Abdomen: soft nontender, nondistended, normal bowel sounds, no hepatosplenomegaly, no hernias Musculoskeletal: : no cyanosis, clubbing ,deformity left hip Neuro: Cranial nerves II-XII intact,grossly intact Psych: judgement and insight appear normal, stable mood and affect,  Skin: 6cm laceration left frontoparietal scalp with staples   Labs on Admission: I have personally reviewed following labs and imaging studies  CBC: No results for input(s): WBC, NEUTROABS, HGB, HCT, MCV, PLT in the last 168 hours. Basic Metabolic Panel: No results for input(s): NA, K, CL, CO2, GLUCOSE, BUN, CREATININE, CALCIUM, MG, PHOS in the last 168 hours. GFR: CrCl cannot be calculated (Patient's most recent lab result is older than the maximum 21 days allowed.). Liver Function Tests: No results for input(s): AST, ALT, ALKPHOS, BILITOT, PROT, ALBUMIN in the last 168 hours. No results for input(s): LIPASE, AMYLASE in the last 168 hours. No results for input(s): AMMONIA in the last 168 hours. Coagulation Profile: No results for input(s): INR, PROTIME in the last 168 hours. Cardiac Enzymes: No results for input(s): CKTOTAL, CKMB, CKMBINDEX, TROPONINI in the last 168 hours. BNP (last 3 results) No results for input(s): PROBNP in the last 8760 hours. HbA1C: No results for input(s): HGBA1C in the last 72 hours. CBG: No results for input(s): GLUCAP in the last 168 hours. Lipid Profile: No results for input(s): CHOL, HDL, LDLCALC, TRIG, CHOLHDL, LDLDIRECT in the last 72 hours. Thyroid Function  Tests: No results for input(s): TSH, T4TOTAL, FREET4, T3FREE, THYROIDAB in the last 72 hours. Anemia Panel: No results for input(s): VITAMINB12, FOLATE, FERRITIN, TIBC, IRON, RETICCTPCT in the last 72 hours. Urine analysis:    Component Value Date/Time   COLORURINE  STRAW (A) 09/12/2019 1315   APPEARANCEUR CLEAR (A) 09/12/2019 1315   LABSPEC 1.005 09/12/2019 1315   PHURINE 7.0 09/12/2019 1315   GLUCOSEU NEGATIVE 09/12/2019 1315   HGBUR SMALL (A) 09/12/2019 1315   BILIRUBINUR NEGATIVE 09/12/2019 1315   KETONESUR 5 (A) 09/12/2019 1315   PROTEINUR NEGATIVE 09/12/2019 1315   NITRITE NEGATIVE 09/12/2019 1315   LEUKOCYTESUR NEGATIVE 09/12/2019 1315    Radiological Exams on Admission: CT Head Wo Contrast  Result Date: 12/06/2019 CLINICAL DATA:  Head trauma EXAM: CT HEAD WITHOUT CONTRAST TECHNIQUE: Contiguous axial images were obtained from the base of the skull through the vertex without intravenous contrast. COMPARISON:  MRI 09/13/2019, CT brain 09/12/2019 FINDINGS: Brain: No acute territorial infarction, hemorrhage or intracranial mass. Mild atrophy. Stable ventricle size Vascular: No hyperdense vessels.  Carotid vascular calcification Skull: Normal. Negative for fracture or focal lesion. Sinuses/Orbits: Postsurgical changes of the maxillary and ethmoid sinuses with mild residual thickening Other: Left frontal scalp laceration IMPRESSION: 1. No CT evidence for acute intracranial abnormality. 2. Atrophy Electronically Signed   By: Donavan Foil M.D.   On: 12/06/2019 03:24   DG HIP UNILAT WITH PELVIS 2-3 VIEWS LEFT  Result Date: 12/06/2019 CLINICAL DATA:  Left hip pain after fall EXAM: DG HIP (WITH OR WITHOUT PELVIS) 2-3V LEFT COMPARISON:  CT 09/12/2019 FINDINGS: SI joints are non widened. Pubic symphysis and rami appear intact. Patient is status post left hip replacement. There is cranial dislocation of the femoral component with respect to the acetabular cup. No definitive fracture is  visualized. IMPRESSION: Status post left hip replacement with superior dislocation of the left femoral component with respect to the acetabular cup Electronically Signed   By: Donavan Foil M.D.   On: 12/06/2019 03:26    EKG: Independently reviewed.   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 Harlow Mares -Follow-up chest x-ray and EKG for completion of clearance -IV hydrocortisone 50 mg every 6 and stress dose for procedure to replace home prednisone -Patient is at low to moderate risk for perioperative cardiopulmonary complications and can proceed with orthopedic procedure for preservation of functional status -Consult Ortho, Dr. Harlow Mares    Adrenal insufficiency [facility])   Current chronic use of systemic steroids -IV hydrocortisone 50 mg every 6 to replace home prednisone  Scalp laceration, initial encounter -wound  repair done in the emergency room -Routine wound care -We will need follow-up for staple removal    Chronic diastolic CHF (congestive heart failure) ([place]) -CHF listed on past documentation but patient not on any related medication.  Last EF 55 to 60% -Not acutely exacerbated.  Patient appears euvolemic -Not currently on ACE inhibitor beta-blocker or Lasix    COPD (chronic obstructive pulmonary disease) ([place]) -Not acutely exacerbated -DuoNeb as needed    Type 2 diabetes mellitus without complication (HCC) -Regular insulin sliding scale while n.p.o.    Dementia without behavioral disturbance ([place]) -Patient on donepezil alprazolam and Wellbutrin which can resume postoperatively pending med rec    DVT prophylaxis: SCDs Code Status: full code  Family Communication:  none   Disposition Plan: Back to previous home environment Consults called: Orthopedist, Dr. Harlow Mares Status: Observation    Athena Masse MD [facility]  12/06/2019, 3:43 AM

## 2019-12-06 NOTE — Progress Notes (Signed)
Pharmacy Antibiotic Note  [name] is a 59 y.o. female admitted on 12/06/2019 with fall s/p total hip replacement on 04/26 w/ subsequent OR hip cx that grew coag negative staph [facility]. ID decided to start patient on outpatient abx for 6 weeks via PICC to start 04/29 til 06/10.  Pharmacy has been consulted for vancomycin dosing.  Plan: Patient's last dose of vancomycin is unclear, also not stated on med rec, as a result ordering a random vanc level to assess.  Patient was placed on a dose of vanc 1g IV q12h given: Ke 0.0647 T1/2 ~ 12 hrs Goal trough 15 - 20 mcg/mL  Since patient's last dose was greater than 12 hours ago, will continue order of: Vancomycin 1g IV Q12 hours   Height: 5\' 4"  (162.6 cm) Weight: 69.9 kg (154 lb) IBW/kg (Calculated) : 54.7  Temp (24hrs), Avg:97.9 F (36.6 C), Min:96.6 F (35.9 C), Max:98.7 F (37.1 C)  Recent Labs  Lab 12/06/19 0319 12/06/19 0410  WBC  --  12.8*  CREATININE 0.82  --     Estimated Creatinine Clearance: 72.7 mL/min (by C-G formula based on SCr of 0.82 mg/dL).    Allergies  Allergen Reactions  . Bupropion Other (See Comments), Nausea Only, Shortness Of Breath and Nausea And Vomiting    cold sweats, extended release, only can take [facility] brand Issues with extended release medications. Other reaction(s): Unknown ER- cold sweats, extended release, only can take [facility] brand Issues with extended release medications.  Mack Hook [Levofloxacin In D5w] Anaphylaxis  . Methylprednisolone Acetate Other (See Comments)  . Amoxicillin Itching  . Codeine Nausea And Vomiting  . Cymbalta [Duloxetine Hcl] Swelling  . Gatifloxacin Hives and Swelling    Other Reaction: Other reaction: Swelling  . Guaifenesin & Derivatives Hives and Swelling  . Keflex [Cephalexin] Other (See Comments)    Unknown  . Klonopin [Clonazepam] Other (See Comments)    "Makes me have bad thoughts"  . Macrobid [employer] Macro] Other (See Comments)   "kidneys shuts down" Chills, Fever.  . Penicillins Hives    Has patient had a PCN reaction causing immediate rash, facial/tongue/throat swelling, SOB or lightheadedness with hypotension: Yes Has patient had a PCN reaction causing severe rash involving mucus membranes or skin necrosis: No Has patient had a PCN reaction that required hospitalization: No Has patient had a PCN reaction occurring within the last 10 years: No If all of the above answers are "NO", then may proceed with Cephalosporin use.  . Sulfa Antibiotics Hives  . Amitriptyline Rash    "It makes me want to fall."  . Other Rash    cortisol    Thank you for allowing pharmacy to be a part of this patient's care.  Pearla Dubonnet, PharmD Clinical Pharmacist 12/06/2019 3:27 PM

## 2019-12-06 NOTE — Op Note (Signed)
PRE-OPERATIVE DIAGNOSIS:  dislocated hip  POST-OPERATIVE DIAGNOSIS:  Same  PROCEDURE:  CLOSED MANIPULATION HIP, LEFT  SURGEON:  Lovell Sheehan, MD   ANESTHESIA:   General  PREOPERATIVE INDICATIONS:  [name] is a  59 y.o. female with a diagnosis of dislocated hip who failed conservative measures and elected for surgical management.    The risks benefits and alternatives were discussed with the patient preoperatively including but not limited to the risks of infection, bleeding, nerve injury, cardiopulmonary complications, the need for revision surgery, among others, and the patient was willing to proceed.  OPERATIVE IMPLANTS: None  OPERATIVE FINDINGS: Dislocated total hip, posterior  EBL: None  COMPLICATIONS:   None  OPERATIVE PROCEDURE: The patient underwent satisfactory general anesthesia in the supine position.  The dislocated hip was seen to be short and unstable.  Using manual traction directed distally and anteriorly the hip was reduced with a satisfactory pop.  Range of motion was stable and leg lengths were restored.  The fluoroscopy was used to show that the hip was indeed reduced.  An abduction pillow was applied to prevent hip and knee flexion.  The patient was awakened and taken to recovery in good condition.   Elyn Aquas. Harlow Mares

## 2019-12-06 NOTE — Evaluation (Signed)
Physical Therapy Evaluation Patient Details Name: [name] MRN: [id number] DOB: March 07, 1961 Today's Date: 12/06/2019   History of Present Illness  presented to ER secondary to fall, hitting L hip and head; admitted for management of L hip dislocation (s/p closed reduction 12/06/19) and L scalp laceration.  Original THR 11/13/19 with discharge to STR; had recently been discharged and home 3-4 days prior to this admission  Clinical Impression  Upon evaluation, patient alert and oriented to basic information; follows commands, but slightly impulsive at times.  Rates L hip pain 6-7/10; meds requested per RN.  Patient generally guarded with all movement of L hip, but demonstrates fair post-op strength (at least 3-/5) and ROM (within THPs); does require active assist for movement throughout all planes with isolated therex.  Able to complete bed mobility with cga (using bilat UEs to self-assist L LE over edge); sit/stand, basic transfers and gait (20') with RW, cga/min assist.  Demonstrates partially reciprocal stepping pattern with fair/good stance time/weight acceptance L LE; generally cautious, but improving with distance.  Min cuing for adherence to THPs with turns.  Will continue to progress distances next session to ensure ability to manage home environment indep. Would benefit from skilled PT to address above deficits and promote optimal return to PLOF.; Recommend transition to HHPT upon discharge from acute hospitalization.     Follow Up Recommendations Home health PT    Equipment Recommendations       Recommendations for Other Services       Precautions / Restrictions Precautions Precautions: Fall;Posterior Hip Restrictions Weight Bearing Restrictions: Yes LLE Weight Bearing: Weight bearing as tolerated      Mobility  Bed Mobility Overal bed mobility: Needs Assistance Bed Mobility: Supine to Sit     Supine to sit: Min guard     General bed mobility comments: using bilat UEs  to self-assist L LE over edge of bed; prefers to exit towards R side of bed  Transfers Overall transfer level: Needs assistance Equipment used: Rolling walker (2 wheeled) Transfers: Sit to/from Stand Sit to Stand: Min guard         General transfer comment: good hand placement, good recall of technique and recall of THPs  Ambulation/Gait Ambulation/Gait assistance: Min guard Gait Distance (Feet): 25 Feet Assistive device: Rolling walker (2 wheeled)       General Gait Details: partially reciprocal stepping pattern with fair/good stance time/weight acceptance L LE; generally cautious, but improving with distance.  Min cuing for adherence to THPs with turns  Financial trader Rankin (Stroke Patients Only)       Balance Overall balance assessment: Needs assistance Sitting-balance support: No upper extremity supported;Feet supported Sitting balance-Leahy Scale: Good     Standing balance support: Bilateral upper extremity supported Standing balance-Leahy Scale: Fair                               Pertinent Vitals/Pain Pain Assessment: Faces Faces Pain Scale: Hurts little more Pain Location: L hip Pain Descriptors / Indicators: Aching;Guarding;Grimacing Pain Intervention(s): Limited activity within patient's tolerance;Monitored during session;Repositioned;Patient requesting pain meds-RN notified    Home Living Family/patient expects to be discharged to:: Private residence Living Arrangements: Alone   Type of Home: House Home Access: Ramped entrance     Home Layout: One level Home Equipment: Environmental consultant - 2 wheels;Cane - single point;Walker - 4 wheels  Prior Function Level of Independence: Independent with assistive device(s)         Comments: At true baseline (3-4 months prior), ambulatory with intermittent use of SPC; most recently using RW s/p L THR.  Scheduled for [facility] services, but not home long enough for  start of care prior to this admission.     Hand Dominance        Extremity/Trunk Assessment   Upper Extremity Assessment Upper Extremity Assessment: Overall WFL for tasks assessed    Lower Extremity Assessment Lower Extremity Assessment: (L hip grossly 3-/5, generally guarded and limited by pain)       Communication   Communication: No difficulties  Cognition Arousal/Alertness: Awake/alert Behavior During Therapy: WFL for tasks assessed/performed Overall Cognitive Status: Within Functional Limits for tasks assessed                                        General Comments      Exercises Other Exercises Other Exercises: L LE supine therex, 1x10, act assist ROM: ankle pumps, SAQs, heel slides, hip abduct/aduct.  Generally guarded throughout, requiring act assist from therapist for movement throughout full, available range. Other Exercises: Toilet transfer, SPT with RW, cga; sit/stand from [facility] with RW, cga; standing balance for hygiene/pericare, cga/close sup Other Exercises: Reviewed THPs-patient able to indep recall 3/3; min cuing for full adherence with functional activities   Assessment/Plan    PT Assessment Patient needs continued PT services  PT Problem List Decreased strength;Decreased range of motion;Decreased activity tolerance;Decreased balance;Decreased mobility;Decreased knowledge of use of DME;Decreased knowledge of precautions;Cardiopulmonary status limiting activity;Decreased safety awareness;Pain       PT Treatment Interventions DME instruction;Gait training;Stair training;Functional mobility training;Therapeutic activities;Therapeutic exercise;Balance training;Cognitive remediation;Patient/family education    PT Goals (Current goals can be found in the Care Plan section)  Acute Rehab PT Goals Patient Stated Goal: to return home PT Goal Formulation: With patient Time For Goal Achievement: 12/20/19 Potential to Achieve Goals: Good     Frequency 7X/week   Barriers to discharge        Co-evaluation               AM-PAC PT "6 Clicks" Mobility  Outcome Measure Help needed turning from your back to your side while in a flat bed without using bedrails?: None Help needed moving from lying on your back to sitting on the side of a flat bed without using bedrails?: A Little Help needed moving to and from a bed to a chair (including a wheelchair)?: A Little Help needed standing up from a chair using your arms (e.g., wheelchair or bedside chair)?: A Little Help needed to walk in hospital room?: A Little Help needed climbing 3-5 steps with a railing? : A Little 6 Click Score: 19    End of Session Equipment Utilized During Treatment: Gait belt Activity Tolerance: Patient tolerated treatment well Patient left: in chair;with call bell/phone within reach;with chair alarm set Nurse Communication: Mobility status PT Visit Diagnosis: Muscle weakness (generalized) (M62.81);Difficulty in walking, not elsewhere classified (R26.2);Pain Pain - Right/Left: Left Pain - part of body: Hip    Time: [id number] PT Time Calculation (min) (ACUTE ONLY): 33 min   Charges:   PT Evaluation $PT Eval Moderate Complexity: 1 Mod PT Treatments $Therapeutic Exercise: 8-22 mins $Therapeutic Activity: 8-22 mins        Aiden Helzer H. Owens Shark, PT, DPT, NCS 12/06/19, 5:30 PM [phone]

## 2019-12-06 NOTE — Transfer of Care (Signed)
Immediate Anesthesia Transfer of Care Note  Patient: [name]  Procedure(s) Performed: CLOSED MANIPULATION HIP (Left Hip)  Patient Location: PACU  Anesthesia Type:General  Level of Consciousness: drowsy  Airway & Oxygen Therapy: Patient Spontanous Breathing and Patient connected to face mask oxygen  Post-op Assessment: Report given to RN and Post -op Vital signs reviewed and stable  Post vital signs: Reviewed and stable  Last Vitals:  Vitals Value Taken Time  BP 124/65 12/06/19 0604  Temp    Pulse 109 12/06/19 0605  Resp 19 12/06/19 0605  SpO2 97 % 12/06/19 0605  Vitals shown include unvalidated device data.  Last Pain:  Vitals:   12/06/19 0206  TempSrc: Oral  PainSc: 10-Worst pain ever         Complications: No apparent anesthesia complications

## 2019-12-06 NOTE — Anesthesia Preprocedure Evaluation (Signed)
Anesthesia Evaluation  Patient identified by MRN, date of birth, ID band Patient awake and Patient confused    Reviewed: Allergy & Precautions, H&P , NPO status , Patient's Chart, lab work & pertinent test results  History of Anesthesia Complications Negative for: history of anesthetic complications  Airway Mallampati: III  TM Distance: <3 FB Neck ROM: limited    Dental  (+) Chipped, Poor Dentition   Pulmonary COPD, Current Smoker and Patient abstained from smoking.,    Pulmonary exam normal        Cardiovascular (-) angina+ CAD and +CHF  Normal cardiovascular exam     Neuro/Psych PSYCHIATRIC DISORDERS negative neurological ROS  negative psych ROS   GI/Hepatic Neg liver ROS, GERD  Medicated and Controlled,  Endo/Other  diabetes, Type 2  Renal/GU Renal disease  negative genitourinary   Musculoskeletal   Abdominal   Peds  Hematology negative hematology ROS (+)   Anesthesia Other Findings Past Medical History: No date: Adrenal insufficiency (HCC) No date: Anxiety No date: CAD (coronary artery disease) No date: Cancer ([place])     Comment:  skin No date: Chronic diastolic CHF (congestive heart failure) (HCC) No date: Chronic pain No date: COPD (chronic obstructive pulmonary disease) (HCC) No date: Cushing disease ([place]) No date: Dementia ([place]) No date: GERD (gastroesophageal reflux disease) No date: HLD (hyperlipidemia)  Past Surgical History: No date: APPENDECTOMY No date: NASAL SEPTUM SURGERY No date: NASAL SINUS SURGERY No date: UMBILICAL HERNIA REPAIR No date: VAGINAL HYSTERECTOMY  BMI    Body Mass Index: 26.43 kg/m      Reproductive/Obstetrics negative OB ROS                             Anesthesia Physical Anesthesia Plan  ASA: III  Anesthesia Plan: General   Post-op Pain Management:    Induction: Intravenous  PONV Risk Score and Plan: Propofol infusion and  TIVA  Airway Management Planned: Natural Airway and Nasal Cannula  Additional Equipment:   Intra-op Plan:   Post-operative Plan:   Informed Consent: I have reviewed the patients History and Physical, chart, labs and discussed the procedure including the risks, benefits and alternatives for the proposed anesthesia with the patient or authorized representative who has indicated his/her understanding and acceptance.     Dental Advisory Given  Plan Discussed with: Anesthesiologist, CRNA and Surgeon  Anesthesia Plan Comments: (History and phone consent from the patients brother KHALISHA GRAMAJO at [phone]   Brother consented for risks of anesthesia including but not limited to:  - adverse reactions to medications - risk of intubation if required - damage to eyes, teeth, lips or other oral mucosa - nerve damage due to positioning  - sore throat or hoarseness - Damage to heart, brain, nerves, lungs or loss of life  Brother voiced understanding)        Anesthesia Quick Evaluation

## 2019-12-06 NOTE — Progress Notes (Signed)
No charge progress note.  [name] is a 59 y.o. female with medical history significant for Adrenal insufficiency on chronic prednisone, diastolic heart failure, COPD, diabetes, dementia who is status post recent left hip replacement April 2021, who presents to the emergency room following an accidental fall onto her left side striking her left hip and head.  Resulted in left hip dislocation.  Taken to the OR by orthopedic for close reduction which was successful.  Per orthopedic patient is weightbearing as tolerated. PT evaluated her and would like to do another session tomorrow to see her long distance.  Patient lives with her son who works so she is alone at home most of the time and would like to be mobile before returning back home.  Patient was seen after the procedure today.  Having 7 out of 10 pain. -Continue current management. -Most likely will go home tomorrow.

## 2019-12-06 NOTE — ED Notes (Signed)
pts belongings placed in pt bag and placed on back of ER stretcher, pt asked this RN to verify pts glasses in pts purse, glasses in purse in pt belonging bag.

## 2019-12-06 NOTE — ED Provider Notes (Signed)
[facility] Emergency Department Provider Note  Time seen: 2:20 AM  I have reviewed the triage vital signs and the nursing notes.   HISTORY  Chief Complaint Fall   HPI [name] is a 59 y.o. female with a past medical history of anxiety, CAD, CHF, COPD, Cushing's disease, dementia, gastric reflux, hyperlipidemia, presents to the emergency department after a fall.  According to the patient she recently placed her dog's bed next to her own bed.  States she was stepping off the bed when she excellently stepped on the dog bed causing her to fall and hit her head.  Denies obvious loss of consciousness.  Patient had a fairly large laceration to her left scalp.  Patient is also complaining of significant pain to her left hip.  Recently had a left hip replacement at the end of April.   Past Medical History:  Diagnosis Date  . Adrenal insufficiency ([place])   . Anxiety   . CAD (coronary artery disease)   . Cancer ([place])    skin  . Chronic diastolic CHF (congestive heart failure) ([place])   . Chronic pain   . COPD (chronic obstructive pulmonary disease) ([place])   . Cushing disease ([place])   . Dementia ([place])   . GERD (gastroesophageal reflux disease)   . HLD (hyperlipidemia)     Patient Active Problem List   Diagnosis Date Noted  . Intractable nausea and vomiting 09/12/2019  . Vertigo 09/12/2019  . Chronic diastolic CHF (congestive heart failure) ([place]) 09/12/2019  . Current chronic use of systemic steroids 09/12/2019  . AKI (acute kidney injury) ([place]) 09/12/2019  . Hypokalemia due to excessive gastrointestinal loss of potassium 09/12/2019  . Abdominal pain 09/07/2017  . Diarrhea   . Chest pain 06/17/2015  . Elevated troponin 06/17/2015  . Fracture, ribs 06/17/2015  . GERD (gastroesophageal reflux disease) 06/17/2015  . COPD (chronic obstructive pulmonary disease) ([place]) 06/17/2015  . Chronic pain 06/17/2015  . Anxiety 06/17/2015  . Adrenal insufficiency ([place])  06/17/2015    Past Surgical History:  Procedure Laterality Date  . APPENDECTOMY    . NASAL SEPTUM SURGERY    . NASAL SINUS SURGERY    . UMBILICAL HERNIA REPAIR    . VAGINAL HYSTERECTOMY      Prior to Admission medications   Medication Sig Start Date End Date Taking? Authorizing Provider  albuterol (PROAIR HFA) 108 (90 BASE) MCG/ACT inhaler Inhale 2 puffs into the lungs every 6 (six) hours as needed. 06/04/15   [provider]  ALPRAZolam Duanne Moron) 1 MG tablet Take 1 mg by mouth 4 (four) times daily.     [provider]  buPROPion (WELLBUTRIN SR) 100 MG 12 hr tablet Take 100 mg by mouth 2 (two) times daily. 08/16/19   [provider]  Calcium Carbonate-Vitamin D 600-400 MG-UNIT tablet Take 1 tablet by mouth daily. 05/22/15 09/07/17  [provider]  cyclobenzaprine (FLEXERIL) 10 MG tablet Take 10 mg by mouth 3 (three) times daily as needed.     [provider]  donepezil (ARICEPT) 10 MG tablet Take 10 mg by mouth at bedtime.    [provider]  EPINEPHrine 0.3 mg/0.3 mL IJ SOAJ injection Inject 0.3 mg into the muscle once. 04/01/18   [provider]  fluticasone (FLONASE) 50 MCG/ACT nasal spray Place 2 sprays into the nose daily. 01/02/13   [provider]  Fluticasone-Salmeterol (ADVAIR DISKUS) 250-50 MCG/DOSE AEPB Inhale 1 puff into the lungs every 12 (twelve) hours. 06/04/15 09/07/17  [provider]  gabapentin (NEURONTIN) 600 MG tablet Take 1 tablet by mouth 3 (three) times daily.    [provider]  glipiZIDE (GLUCOTROL) 5 MG tablet Take 5 mg by mouth daily. 08/16/19   [provider]  hydrOXYzine (ATARAX/VISTARIL) 25 MG tablet Take 25 mg by mouth 3 (three) times daily as needed for itching. 03/23/18   [provider]  magnesium oxide (MAG-OX) 400 MG tablet Take 400 mg by mouth daily.    [provider]  meclizine (ANTIVERT) 25 MG tablet Take 1 tablet (25 mg total) by mouth 3  (three) times daily as needed for dizziness. 09/14/19   Danford, Suann Larry, MD  omeprazole (PRILOSEC) 40 MG capsule Take 1 capsule by mouth 2 (two) times daily. 02/19/15 09/07/17  [provider]  ondansetron (ZOFRAN-ODT) 4 MG disintegrating tablet Take 1 tablet (4 mg total) by mouth every 8 (eight) hours as needed for nausea or vomiting. 09/14/19   Danford, Suann Larry, MD  oxyCODONE-acetaminophen (PERCOCET) 10-325 MG tablet Take 1 tablet by mouth every 4 (four) hours as needed for pain (pt has to take [place] brand).  10/05/12   [provider]  potassium chloride SA (K-DUR,KLOR-CON) 20 MEQ tablet Take 1 tablet (20 mEq total) by mouth 2 (two) times daily. 09/09/17   Epifanio Lesches, MD  predniSONE (DELTASONE) 1 MG tablet Take 2 mg by mouth daily with breakfast.    [provider]  predniSONE (DELTASONE) 5 MG tablet Take 15 mg by mouth daily with breakfast.    [provider]  promethazine (PHENERGAN) 25 MG tablet Take 1 tablet (25 mg total) by mouth every 8 (eight) hours as needed for nausea or vomiting. 09/14/19   Danford, Suann Larry, MD    Allergies  Allergen Reactions  . Bupropion Other (See Comments), Nausea Only, Shortness Of Breath and Nausea And Vomiting    cold sweats, extended release, only can take [place] brand Issues with extended release medications. Other reaction(s): Unknown ER- cold sweats, extended release, only can take [place] brand Issues with extended release medications.  [place] [Levofloxacin In D5w] Anaphylaxis  . Methylprednisolone Acetate Other (See Comments)  . Amoxicillin Itching  . Codeine Nausea And Vomiting  . Cymbalta [Duloxetine Hcl] Swelling  . Gatifloxacin Hives and Swelling    Other Reaction: Other reaction: Swelling  . Guaifenesin & Derivatives Hives and Swelling  . Keflex [Cephalexin] Other (See Comments)    Unknown  . Klonopin [Clonazepam] Other (See Comments)    "Makes me have bad thoughts"  .  Macrobid [employer] Macro] Other (See Comments)    "kidneys shuts down" Chills, Fever.  . Penicillins Hives    Has patient had a PCN reaction causing immediate rash, facial/tongue/throat swelling, SOB or lightheadedness with hypotension: Yes Has patient had a PCN reaction causing severe rash involving mucus membranes or skin necrosis: No Has patient had a PCN reaction that required hospitalization: No Has patient had a PCN reaction occurring within the last 10 years: No If all of the above answers are "NO", then may proceed with Cephalosporin use.  . Sulfa Antibiotics Hives  . Amitriptyline Rash    "It makes me want to fall."  . Other Rash    cortisol    Family History  Problem Relation Age of Onset  . Ovarian cancer Mother   . Breast cancer Mother   . Brain cancer Mother   . CAD Father   . Diabetes Father   . Hepatitis Father   .  Liver disease Father   . Cancer Father   . Alcohol abuse Father   . Heart attack Brother   . Sleep apnea Brother   . Diabetes Brother   . ALS Paternal Grandmother   . Stroke Maternal Grandmother     Social History Social History   Tobacco Use  . Smoking status: Current Every Day Smoker    Packs/day: 0.50    Types: Cigarettes  . Smokeless tobacco: Never Used  Substance Use Topics  . Alcohol use: Yes    Alcohol/week: 0.0 standard drinks    Comment: rarely  . Drug use: No    Review of Systems Constitutional: Negative for fever Cardiovascular: Negative for chest pain. Respiratory: Negative for shortness of breath. Gastrointestinal: Negative for abdominal pain Musculoskeletal: Significant left hip pain. Skin: Laceration to left scalp Neurological: Negative for headache All other ROS negative  ____________________________________________   PHYSICAL EXAM:  VITAL SIGNS: ED Triage Vitals [12/06/19 0206]  Enc Vitals Group     BP      Pulse Rate (!) 107     Resp 16     Temp      Temp src      SpO2      Weight 154 lb  (69.9 kg)     Height 5\' 4"  (1.626 m)     Head Circumference      Peak Flow      Pain Score 10     Pain Loc      Pain Edu?      Excl. in [place]?     Constitutional: Awake alert, no acute distress. Eyes: Normal exam ENT      Head: Patient has an approximate 6 cm laceration extending from the left temporal area bordering the hairline of the forehead.  No active bleeding.      Mouth/Throat: Mucous membranes are moist. Cardiovascular: Normal rate, regular rhythm Respiratory: Normal respiratory effort without tachypnea nor retractions. Breath sounds are clear  Gastrointestinal: Soft and nontender. No distention.   Musculoskeletal: Moderate left hip tenderness palpation.  Neurovascular intact distally.  Pain with range of motion of left hip.  Fairly recent surgical incision, well-appearing. Neurologic:  Normal speech and language. No gross focal neurologic deficits  Skin:  Skin is warm.  Laceration as described above to left scalp. Psychiatric: Mood and affect are normal.  ____________________________________________     RADIOLOGY  I reviewed the patient's x-ray appears consistent with a prosthetic dislocation. I reviewed the CT images no acute abnormality identified awaiting radiology read.  ____________________________________________   INITIAL IMPRESSION / ASSESSMENT AND PLAN / ED COURSE  Pertinent labs & imaging results that were available during my care of the patient were reviewed by me and considered in my medical decision making (see chart for details).   Patient presents to the emergency department after a fall at home.  Patient has a approximate 6 cm left scalp laceration that will require staples for repair.  Patient also has significant pain to the left hip, recent hip replacement approximately 1 month ago.  We will obtain x-ray images to rule out fracture or dislocation.  We will treat pain, check labs and continue to closely monitor.  Patient agreeable to plan of  care.  Patient's x-ray confirms left hip dislocation.  I spoke with Dr. Harlow Mares we will admit to the hospitalist service and he will plan on taking to the operating room in the morning for hopefully a closed reduction.  Patient agreeable to plan of care.  Labs are pending.  Patient admitted to the hospitalist service.  LACERATION REPAIR Performed by: Harvest Dark Authorized by: Harvest Dark Consent: Verbal consent obtained. Risks and benefits: risks, benefits and alternatives were discussed Consent given by: patient Patient identity confirmed: provided demographic data Prepped and Draped in normal sterile fashion Wound explored  Laceration Location: left scalp/forehead  Laceration Length: 6cm  No Foreign Bodies seen or palpated  Anesthesia: local infiltration  Local anesthetic: lidocaine 1% w/o epinephrine  Anesthetic total: 8 ml  Irrigation method: syringe Amount of cleaning: standard  Skin closure: staples  Number of staples: 4   Patient tolerance: Patient tolerated the procedure well with no immediate complications.   YURIE JECK was evaluated in Emergency Department on 12/06/2019 for the symptoms described in the history of present illness. She was evaluated in the context of the global COVID-19 pandemic, which necessitated consideration that the patient might be at risk for infection with the SARS-CoV-2 virus that causes COVID-19. Institutional protocols and algorithms that pertain to the evaluation of patients at risk for COVID-19 are in a state of rapid change based on information released by regulatory bodies including the CDC and federal and state organizations. These policies and algorithms were followed during the patient's care in the ED.  ____________________________________________   FINAL CLINICAL IMPRESSION(S) / ED DIAGNOSES  Fall Head injury Laceration Hip injury   Harvest Dark, MD 12/06/19 [phone]

## 2019-12-06 NOTE — ED Triage Notes (Signed)
Pt to the er form home for injuries sustained in a fall. Pt was ambulating to the bathroom and fell striking her head on a piece of furniture. Pt landed on her left hip. Pt has a  5 cm lac to the left hairline and a large skin tear to the left elbow.

## 2019-12-07 DIAGNOSIS — T84021A Dislocation of internal left hip prosthesis, initial encounter: Secondary | ICD-10-CM

## 2019-12-07 DIAGNOSIS — E2740 Unspecified adrenocortical insufficiency: Secondary | ICD-10-CM

## 2019-12-07 DIAGNOSIS — W19XXXA Unspecified fall, initial encounter: Secondary | ICD-10-CM | POA: Diagnosis not present

## 2019-12-07 DIAGNOSIS — J449 Chronic obstructive pulmonary disease, unspecified: Secondary | ICD-10-CM

## 2019-12-07 DIAGNOSIS — I5032 Chronic diastolic (congestive) heart failure: Secondary | ICD-10-CM | POA: Diagnosis not present

## 2019-12-07 LAB — CBC
HCT: 28.7 % — ABNORMAL LOW (ref 36.0–46.0)
Hemoglobin: 9 g/dL — ABNORMAL LOW (ref 12.0–15.0)
MCH: 29.4 pg (ref 26.0–34.0)
MCHC: 31.4 g/dL (ref 30.0–36.0)
MCV: 93.8 fL (ref 80.0–100.0)
Platelets: 258 10*3/uL (ref 150–400)
RBC: 3.06 MIL/uL — ABNORMAL LOW (ref 3.87–5.11)
RDW: 14.2 % (ref 11.5–15.5)
WBC: 9.8 10*3/uL (ref 4.0–10.5)
nRBC: 0 % (ref 0.0–0.2)

## 2019-12-07 LAB — BASIC METABOLIC PANEL
Anion gap: 7 (ref 5–15)
BUN: 15 mg/dL (ref 6–20)
CO2: 31 mmol/L (ref 22–32)
Calcium: 8.7 mg/dL — ABNORMAL LOW (ref 8.9–10.3)
Chloride: 99 mmol/L (ref 98–111)
Creatinine, Ser: 0.6 mg/dL (ref 0.44–1.00)
GFR calc Af Amer: >60 mL/min (ref >60)
GFR calc non Af Amer: >60 mL/min (ref >60)
Glucose, Bld: 157 mg/dL — ABNORMAL HIGH (ref 70–99)
Potassium: 4 mmol/L (ref 3.5–5.1)
Sodium: 137 mmol/L (ref 135–145)

## 2019-12-07 LAB — GLUCOSE, CAPILLARY
Glucose-Capillary: 114 mg/dL — ABNORMAL HIGH (ref 70–99)
Glucose-Capillary: 119 mg/dL — ABNORMAL HIGH (ref 70–99)

## 2019-12-07 NOTE — Plan of Care (Signed)
3rd shift report informed me I was NOT to use patient's PICC.  However, patinet insisted that her ambx were to ONLY go in PICC.  Hospitalist asked to clarify and stated it is ok to use.  Vanco administered through PICC.

## 2019-12-07 NOTE — Anesthesia Postprocedure Evaluation (Signed)
Anesthesia Post Note  Patient: [name]  Procedure(s) Performed: CLOSED MANIPULATION HIP (Left Hip)  Patient location during evaluation: PACU Anesthesia Type: General Level of consciousness: awake and alert Pain management: pain level controlled Vital Signs Assessment: post-procedure vital signs reviewed and stable Respiratory status: spontaneous breathing, nonlabored ventilation, respiratory function stable and patient connected to nasal cannula oxygen Cardiovascular status: blood pressure returned to baseline and stable Postop Assessment: no apparent nausea or vomiting Anesthetic complications: no     Last Vitals:  Vitals:   12/06/19 2304 12/07/19 0412  BP: 122/70 103/70  Pulse: (!) 107 93  Resp: 18 18  Temp: 36.8 C 36.7 C  SpO2: 99% 92%    Last Pain:  Vitals:   12/07/19 0440  TempSrc:   PainSc: 5                  Precious Haws Kasai Beltran

## 2019-12-07 NOTE — TOC Transition Note (Signed)
Transition of Care [facility]) - CM/SW Discharge Note   Patient Details  Name: TIANNAH PAGANINI MRN: [id number] Date of Birth: [birth year]  Transition [facility] CM/SW Contact:  Su Hilt, RN Phone Number: 12/07/2019, 10:34 AM   Clinical Narrative:     Called EMS to arrange transport for noon today to go to her residence.  Bedside nurse is aware And agrees to noon pickup  Final next level of care: Home w Home Health Services Barriers to Discharge: Barriers Resolved   Patient Goals and CMS Choice Patient states their goals for this hospitalization and ongoing recovery are:: go home      Discharge Placement                       Discharge Plan and Services   Discharge Planning Services: CM Consult            DME Arranged: N/A         HH Arranged: NA          Social Determinants of Health (SDOH) Interventions     Readmission Risk Interventions No flowsheet data found.

## 2019-12-07 NOTE — Discharge Summary (Signed)
Physician Discharge Summary  [name] [id number] DOB: 1961/04/05 DOA: 12/06/2019  PCP: Townsend Roger, MD  Admit date: 12/06/2019 Discharge date: 12/07/2019  Admitted From: Home Disposition:  Home  Recommendations for Outpatient Follow-up:  1. Follow up with PCP in 1-2 weeks 2. Follow-up with orthopedic at [facility] 3. Please obtain BMP/CBC in one week 4. Please follow up on the following pending results:None  Home Health:yes Equipment/Devices: Rolling walker Discharge Condition: Stable CODE STATUS: Full Diet recommendation: Heart Healthy / Carb Modified   Brief/Interim Summary: [name] is a 59 y.o. female with medical history significant for Adrenal insufficiency on chronic prednisone, diastolic heart failure, COPD, diabetes, dementia who is status post recent left hip replacement April 2021, who presents to the emergency room following an accidental fall onto her left side striking her left hip and head.  States she was getting off of her bed and accidentally tripped on the dog's bed that was beside her bed and fell.  Denies loss of consciousness.  She denied preceding lightheadedness, chest pain, palpitations, visual disturbance or numbness or weakness on one side.  She sustained a laceration to the left scalp and was unable to get up on her own after the fall. Hip x-ray shows superior dislocation of the left femoral component of the hip replacement, she was taken to the OR and a successful close reduction was performed by orthopedic. Patient was advised to bear weight as tolerated and will follow up with her orthopedic surgeon at [facility] who performed the surgery.  Patient was placed on vancomycin for staph positive cultures during arthroplasty, she was advised to have vancomycin by ID at [facility] for 6 weeks. She will continue with her therapy and finished a course on 12/28/2019.  She will continue with rest of her home meds and follow-up with her PCP.   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) Active Problems:   COPD (chronic obstructive pulmonary disease) (HCC)   Adrenal insufficiency (HCC)   Chronic diastolic CHF (congestive heart failure) (HCC)   Current chronic use of systemic steroids   Preoperative clearance   Accidental fall   Scalp laceration, initial encounter   Type 2 diabetes mellitus without complication (HCC)   Dementia without behavioral disturbance (HCC)   S/P hip replacement, left   Discharge Instructions  Discharge Instructions    Diet - low sodium heart healthy   Complete by: As directed    Discharge instructions   Complete by: As directed    It was pleasure taking care of you. Please continue with your vancomycin as directed and follow-up with your infectious disease physician. Please follow-up with your surgeons at [facility].   Increase activity slowly   Complete by: As directed    Increase activity slowly   Complete by: As directed      Allergies as of 12/07/2019      Reactions   Bupropion Other (See Comments), Nausea Only, Shortness Of Breath, Nausea And Vomiting   cold sweats, extended release, only can take [facility] brand Issues with extended release medications. Other reaction(s): Unknown ER- cold sweats, extended release, only can take [employer] brand Issues with extended release medications.   Levaquin [levofloxacin In D5w] Anaphylaxis   Methylprednisolone Acetate Other (See Comments)   Amoxicillin Itching   Codeine Nausea And Vomiting   Cymbalta [duloxetine Hcl] Swelling   Gatifloxacin Hives, Swelling   Other Reaction: Other reaction: Swelling   Guaifenesin & Derivatives Hives, Swelling  Keflex [cephalexin] Other (See Comments)   Unknown   Klonopin [clonazepam] Other (See Comments)   "Makes me have bad thoughts"   Macrobid [nitrofurantoin Monohyd Macro] Other (See Comments)   "kidneys shuts down" Chills, Fever.   Penicillins Hives   Has patient had a PCN reaction  causing immediate rash, facial/tongue/throat swelling, SOB or lightheadedness with hypotension: Yes Has patient had a PCN reaction causing severe rash involving mucus membranes or skin necrosis: No Has patient had a PCN reaction that required hospitalization: No Has patient had a PCN reaction occurring within the last 10 years: No If all of the above answers are "NO", then may proceed with Cephalosporin use.   Sulfa Antibiotics Hives   Amitriptyline Rash   "It makes me want to fall."   Other Rash   cortisol      Medication List    TAKE these medications   Advair Diskus 250-50 MCG/DOSE Aepb Generic drug: Fluticasone-Salmeterol Inhale 1 puff into the lungs every 12 (twelve) hours.   ALPRAZolam 1 MG tablet Commonly known as: XANAX Take 1 mg by mouth 4 (four) times daily.   buPROPion 100 MG 12 hr tablet Commonly known as: WELLBUTRIN SR Take 100 mg by mouth 2 (two) times daily.   Calcium Carbonate-Vitamin D 600-400 MG-UNIT tablet Take 1 tablet by mouth daily.   cyclobenzaprine 10 MG tablet Commonly known as: FLEXERIL Take 10 mg by mouth 3 (three) times daily as needed.   donepezil 10 MG tablet Commonly known as: ARICEPT Take 10 mg by mouth at bedtime.   EPINEPHrine 0.3 mg/0.3 mL Soaj injection Commonly known as: EPI-PEN Inject 0.3 mg into the muscle once.   fluticasone 50 MCG/ACT nasal spray Commonly known as: FLONASE Place 2 sprays into the nose daily.   gabapentin 600 MG tablet Commonly known as: NEURONTIN Take 1 tablet by mouth 3 (three) times daily.   glipiZIDE 5 MG tablet Commonly known as: GLUCOTROL Take 5 mg by mouth daily.   hydrOXYzine 25 MG tablet Commonly known as: ATARAX/VISTARIL Take 25 mg by mouth 3 (three) times daily as needed for itching.   magnesium oxide 400 MG tablet Commonly known as: MAG-OX Take 400 mg by mouth daily.   meclizine 25 MG tablet Commonly known as: ANTIVERT Take 1 tablet (25 mg total) by mouth 3 (three) times daily as  needed for dizziness.   omeprazole 40 MG capsule Commonly known as: PRILOSEC Take 1 capsule by mouth 2 (two) times daily.   ondansetron 4 MG disintegrating tablet Commonly known as: ZOFRAN-ODT Take 1 tablet (4 mg total) by mouth every 8 (eight) hours as needed for nausea or vomiting.   oxyCODONE-acetaminophen 10-325 MG tablet Commonly known as: PERCOCET Take 1 tablet by mouth every 4 (four) hours as needed for pain (pt has to take [facility] brand).   potassium chloride SA 20 MEQ tablet Commonly known as: KLOR-CON Take 1 tablet (20 mEq total) by mouth 2 (two) times daily.   predniSONE 5 MG tablet Commonly known as: DELTASONE Take 15 mg by mouth daily with breakfast. What changed: Another medication with the same name was removed. Continue taking this medication, and follow the directions you see here.   ProAir HFA 108 (90 Base) MCG/ACT inhaler Generic drug: albuterol Inhale 2 puffs into the lungs every 6 (six) hours as needed.   promethazine 25 MG tablet Commonly known as: PHENERGAN Take 1 tablet (25 mg total) by mouth every 8 (eight) hours as needed for nausea or vomiting.  Follow-up Information    Townsend Roger, MD. Schedule an appointment as soon as possible for a visit.   Specialty: Internal Medicine Contact information: [address] [place] [address] [phone]          Allergies  Allergen Reactions  . Bupropion Other (See Comments), Nausea Only, Shortness Of Breath and Nausea And Vomiting    cold sweats, extended release, only can take [facility] brand Issues with extended release medications. Other reaction(s): Unknown ER- cold sweats, extended release, only can take [facility] brand Issues with extended release medications.  [place] [Levofloxacin In D5w] Anaphylaxis  . Methylprednisolone Acetate Other (See Comments)  . Amoxicillin Itching  . Codeine Nausea And Vomiting  . Cymbalta [Duloxetine Hcl] Swelling  . Gatifloxacin Hives and  Swelling    Other Reaction: Other reaction: Swelling  . Guaifenesin & Derivatives Hives and Swelling  . Keflex [Cephalexin] Other (See Comments)    Unknown  . Klonopin [Clonazepam] Other (See Comments)    "Makes me have bad thoughts"  . Macrobid [employer] Macro] Other (See Comments)    "kidneys shuts down" Chills, Fever.  . Penicillins Hives    Has patient had a PCN reaction causing immediate rash, facial/tongue/throat swelling, SOB or lightheadedness with hypotension: Yes Has patient had a PCN reaction causing severe rash involving mucus membranes or skin necrosis: No Has patient had a PCN reaction that required hospitalization: No Has patient had a PCN reaction occurring within the last 10 years: No If all of the above answers are "NO", then may proceed with Cephalosporin use.  . Sulfa Antibiotics Hives  . Amitriptyline Rash    "It makes me want to fall."  . Other Rash    cortisol    Consultations: Orthopedic surgery  Procedures/Studies: CT Head Wo Contrast  Result Date: 12/06/2019 CLINICAL DATA:  Head trauma EXAM: CT HEAD WITHOUT CONTRAST TECHNIQUE: Contiguous axial images were obtained from the base of the skull through the vertex without intravenous contrast. COMPARISON:  MRI 09/13/2019, CT brain 09/12/2019 FINDINGS: Brain: No acute territorial infarction, hemorrhage or intracranial mass. Mild atrophy. Stable ventricle size Vascular: No hyperdense vessels.  Carotid vascular calcification Skull: Normal. Negative for fracture or focal lesion. Sinuses/Orbits: Postsurgical changes of the maxillary and ethmoid sinuses with mild residual thickening Other: Left frontal scalp laceration IMPRESSION: 1. No CT evidence for acute intracranial abnormality. 2. Atrophy Electronically Signed   By: Donavan Foil M.D.   On: 12/06/2019 03:24   DG Pelvis Portable  Result Date: 12/06/2019 CLINICAL DATA:  Postop check. EXAM: PORTABLE PELVIS 1-2 VIEWS COMPARISON:  Earlier today FINDINGS:  Relocated left hip arthroplasty with well-seated arthroplasty. Mild irregularity at the greater trochanter which is unchanged from preoperative study. Remote right inferior pubic ramus fracture. IMPRESSION: Relocated left hip arthroplasty. Electronically Signed   By: Monte Fantasia M.D.   On: 12/06/2019 07:06   DG Chest Port 1 View  Result Date: 12/06/2019 CLINICAL DATA:  Preop. EXAM: PORTABLE CHEST 1 VIEW COMPARISON:  07/09/2018 FINDINGS: Right-sided PICC with tip at the upper cavoatrial junction. There is no edema, consolidation, effusion, or pneumothorax. Normal heart size and mediastinal contours. Trachea is deviated to the left at the lower neck, likely from chin positioning. IMPRESSION: No evidence of active disease. Electronically Signed   By: Monte Fantasia M.D.   On: 12/06/2019 04:21   DG HIP OPERATIVE UNILAT W OR W/O PELVIS LEFT  Result Date: 12/06/2019 CLINICAL DATA:  Closed reduction of left hip EXAM: OPERATIVE LEFT HIP (  WITH PELVIS IF PERFORMED) 1 VIEWS TECHNIQUE: Fluoroscopic spot image(s) were submitted for interpretation post-operatively. COMPARISON:  Radiography from earlier today FINDINGS: Unlabeled study but the patient only has a unilateral arthroplasty on the left. On the single lateral image the prosthesis is located. IMPRESSION: Fluoroscopy for left hip prosthesis reduction. Electronically Signed   By: Monte Fantasia M.D.   On: 12/06/2019 06:27   DG HIP UNILAT WITH PELVIS 2-3 VIEWS LEFT  Result Date: 12/06/2019 CLINICAL DATA:  Left hip pain after fall EXAM: DG HIP (WITH OR WITHOUT PELVIS) 2-3V LEFT COMPARISON:  CT 09/12/2019 FINDINGS: SI joints are non widened. Pubic symphysis and rami appear intact. Patient is status post left hip replacement. There is cranial dislocation of the femoral component with respect to the acetabular cup. No definitive fracture is visualized. IMPRESSION: Status post left hip replacement with superior dislocation of the left femoral component with  respect to the acetabular cup Electronically Signed   By: Donavan Foil M.D.   On: 12/06/2019 03:26    Subjective: Patient has no new complaints today.  She was little agitated and wants nursing staff to use a PICC line the way she was using it at home.  Discharge Exam: Vitals:   12/07/19 0412 12/07/19 0841  BP: 103/70 (!) 116/57  Pulse: 93 81  Resp: 18 16  Temp: 98.1 F (36.7 C) 98.6 F (37 C)  SpO2: 92% 97%   Vitals:   12/06/19 2223 12/06/19 2304 12/07/19 0412 12/07/19 0841  BP: 121/61 122/70 103/70 (!) 116/57  Pulse: (!) 104 (!) 107 93 81  Resp: 19 18 18 16   Temp: (!) 97.5 F (36.4 C) 98.2 F (36.8 C) 98.1 F (36.7 C) 98.6 F (37 C)  TempSrc: Oral Oral  Oral  SpO2: 96% 99% 92% 97%  Weight:      Height:        General: Pt is alert, awake, not in acute distress Cardiovascular: RRR, S1/S2 +, no rubs, no gallops Respiratory: CTA bilaterally, no wheezing, no rhonchi Abdominal: Soft, NT, ND, bowel sounds + Extremities: no edema, no cyanosis   The results of significant diagnostics from this hospitalization (including imaging, microbiology, ancillary and laboratory) are listed below for reference.    Microbiology: Recent Results (from the past 240 hour(s))  SARS Coronavirus 2 by RT PCR (hospital order, performed in [facility] hospital lab) Nasopharyngeal Nasopharyngeal Swab     Status: None   Collection Time: 12/06/19  3:19 AM   Specimen: Nasopharyngeal Swab  Result Value Ref Range Status   SARS Coronavirus 2 NEGATIVE NEGATIVE Final    Comment: (NOTE) SARS-CoV-2 target nucleic acids are NOT DETECTED. The SARS-CoV-2 RNA is generally detectable in upper and lower respiratory specimens during the acute phase of infection. The lowest concentration of SARS-CoV-2 viral copies this assay can detect is 250 copies / mL. A negative result does not preclude SARS-CoV-2 infection and should not be used as the sole basis for treatment or other patient management decisions.  A  negative result may occur with improper specimen collection / handling, submission of specimen other than nasopharyngeal swab, presence of viral mutation(s) within the areas targeted by this assay, and inadequate number of viral copies (<250 copies / mL). A negative result must be combined with clinical observations, patient history, and epidemiological information. Fact Sheet for Patients:   StrictlyIdeas.no Fact Sheet for Healthcare Providers: BankingDealers.co.za This test is not yet approved or cleared  by the [place] FDA and has been authorized for detection and/or  diagnosis of SARS-CoV-2 by FDA under an Emergency Use Authorization (EUA).  This EUA will remain in effect (meaning this test can be used) for the duration of the COVID-19 declaration under Section 564(b)(1) of the Act, 21 U.S.C. section 360bbb-3(b)(1), unless the authorization is terminated or revoked sooner. Performed at [facility], [address] [place], [place] [address]      Labs: BNP (last 3 results) No results for input(s): BNP in the last 8760 hours. Basic Metabolic Panel: Recent Labs  Lab 12/06/19 0319 12/07/19 0630  NA 138 137  K 3.5 4.0  CL 93* 99  CO2 34* 31  GLUCOSE 119* 157*  BUN 26* 15  CREATININE 0.82 0.60  CALCIUM 8.9 8.7*   Liver Function Tests: Recent Labs  Lab 12/06/19 0319  AST 25  ALT 22  ALKPHOS 132*  BILITOT 0.6  PROT 6.3*  ALBUMIN 3.4*   No results for input(s): LIPASE, AMYLASE in the last 168 hours. No results for input(s): AMMONIA in the last 168 hours. CBC: Recent Labs  Lab 12/06/19 0410 12/07/19 0630  WBC 12.8* 9.8  HGB 9.9* 9.0*  HCT 31.1* 28.7*  MCV 92.8 93.8  PLT 316 258   Cardiac Enzymes: No results for input(s): CKTOTAL, CKMB, CKMBINDEX, TROPONINI in the last 168 hours. BNP: Invalid input(s): POCBNP CBG: Recent Labs  Lab 12/06/19 1326 12/06/19 1708 12/06/19 2023 12/07/19 0036  12/07/19 0807  GLUCAP 135* 180* 125* 119* 114*   D-Dimer No results for input(s): DDIMER in the last 72 hours. Hgb A1c Recent Labs    12/06/19 0319  HGBA1C 5.3   Lipid Profile No results for input(s): CHOL, HDL, LDLCALC, TRIG, CHOLHDL, LDLDIRECT in the last 72 hours. Thyroid function studies No results for input(s): TSH, T4TOTAL, T3FREE, THYROIDAB in the last 72 hours.  Invalid input(s): FREET3 Anemia work up No results for input(s): VITAMINB12, FOLATE, FERRITIN, TIBC, IRON, RETICCTPCT in the last 72 hours. Urinalysis    Component Value Date/Time   COLORURINE STRAW (A) 09/12/2019 1315   APPEARANCEUR CLEAR (A) 09/12/2019 1315   LABSPEC 1.005 09/12/2019 1315   PHURINE 7.0 09/12/2019 1315   GLUCOSEU NEGATIVE 09/12/2019 1315   HGBUR SMALL (A) 09/12/2019 1315   BILIRUBINUR NEGATIVE 09/12/2019 1315   KETONESUR 5 (A) 09/12/2019 1315   PROTEINUR NEGATIVE 09/12/2019 1315   NITRITE NEGATIVE 09/12/2019 1315   LEUKOCYTESUR NEGATIVE 09/12/2019 1315   Sepsis Labs Invalid input(s): PROCALCITONIN,  WBC,  LACTICIDVEN Microbiology Recent Results (from the past 240 hour(s))  SARS Coronavirus 2 by RT PCR (hospital order, performed in [place] hospital lab) Nasopharyngeal Nasopharyngeal Swab     Status: None   Collection Time: 12/06/19  3:19 AM   Specimen: Nasopharyngeal Swab  Result Value Ref Range Status   SARS Coronavirus 2 NEGATIVE NEGATIVE Final    Comment: (NOTE) SARS-CoV-2 target nucleic acids are NOT DETECTED. The SARS-CoV-2 RNA is generally detectable in upper and lower respiratory specimens during the acute phase of infection. The lowest concentration of SARS-CoV-2 viral copies this assay can detect is 250 copies / mL. A negative result does not preclude SARS-CoV-2 infection and should not be used as the sole basis for treatment or other patient management decisions.  A negative result may occur with improper specimen collection / handling, submission of specimen  other than nasopharyngeal swab, presence of viral mutation(s) within the areas targeted by this assay, and inadequate number of viral copies (<250 copies / mL). A negative result must be combined with clinical observations, patient history,  and epidemiological information. Fact Sheet for Patients:   StrictlyIdeas.no Fact Sheet for Healthcare Providers: BankingDealers.co.za This test is not yet approved or cleared  by the [place] FDA and has been authorized for detection and/or diagnosis of SARS-CoV-2 by FDA under an Emergency Use Authorization (EUA).  This EUA will remain in effect (meaning this test can be used) for the duration of the COVID-19 declaration under Section 564(b)(1) of the Act, 21 U.S.C. section 360bbb-3(b)(1), unless the authorization is terminated or revoked sooner. Performed at [facility], [address] [place], [place] [address]     Time coordinating discharge: Over 30 minutes  SIGNED:  Lorella Nimrod, MD  [facility] 12/07/2019, 10:16 AM  If 7PM-7AM, please contact night-coverage [email or web address]  This record has been created using Systems analyst. Errors have been sought and corrected,but may not always be located. Such creation errors do not reflect on the standard of care.

## 2019-12-07 NOTE — Discharge Planning (Signed)
Patient IV removed.  RN assessment and VS revealed stability for DC to home with re-started [facility] services.  Patient PICC-line left in place per Dr. Montine Circle and administration of needed antibiotics.  Informed of suggested FU at PCP and appt made.  Discharge papers given, explained and educated.  EMS called to transport home.

## 2019-12-07 NOTE — Progress Notes (Signed)
Subjective:  Patient reports pain as moderate.    Objective:   VITALS:   Vitals:   12/06/19 2023 12/06/19 2223 12/06/19 2304 12/07/19 0412  BP: 106/88 121/61 122/70 103/70  Pulse: (!) 106 (!) 104 (!) 107 93  Resp: _0 Temp: 98.3 F (36.8 C) (!) 97.5 F (36.4 C) 98.2 F (36.8 C) 98.1 F (36.7 C)  TempSrc: Oral Oral Oral   SpO2:  96% 99% 92%  Weight:      Height:        PHYSICAL EXAM:  Neurologically intact ABD soft Neurovascular intact Sensation intact distally Intact pulses distally Dorsiflexion/Plantar flexion intact No cellulitis present Compartment soft  LABS  Results for orders placed or performed during the hospital encounter of 12/06/19 (from the past 24 hour(s))  Glucose, capillary     Status: Abnormal   Collection Time: 12/06/19  8:56 AM  Result Value Ref Range   Glucose-Capillary 148 (H) 70 - 99 mg/dL  Vancomycin, random     Status: None   Collection Time: 12/06/19  1:21 PM  Result Value Ref Range   Vancomycin Rm 7   Glucose, capillary     Status: Abnormal   Collection Time: 12/06/19  1:26 PM  Result Value Ref Range   Glucose-Capillary 135 (H) 70 - 99 mg/dL  Glucose, capillary     Status: Abnormal   Collection Time: 12/06/19  5:08 PM  Result Value Ref Range   Glucose-Capillary 180 (H) 70 - 99 mg/dL  Glucose, capillary     Status: Abnormal   Collection Time: 12/06/19  8:23 PM  Result Value Ref Range   Glucose-Capillary 125 (H) 70 - 99 mg/dL  Glucose, capillary     Status: Abnormal   Collection Time: 12/07/19 12:36 AM  Result Value Ref Range   Glucose-Capillary 119 (H) 70 - 99 mg/dL  CBC     Status: Abnormal   Collection Time: 12/07/19  6:30 AM  Result Value Ref Range   WBC 9.8 4.0 - 10.5 K/uL   RBC 3.06 (L) 3.87 - 5.11 MIL/uL   Hemoglobin 9.0 (L) 12.0 - 15.0 g/dL   HCT 28.7 (L) 36.0 - 46.0 %   MCV 93.8 80.0 - 100.0 fL   MCH 29.4 26.0 - 34.0 pg   MCHC 31.4 30.0 - 36.0 g/dL   RDW 14.2 11.5 - 15.5 %   Platelets 258 150 - 400 K/uL    nRBC 0.0 0.0 - 0.2 %  Basic metabolic panel     Status: Abnormal   Collection Time: 12/07/19  6:30 AM  Result Value Ref Range   Sodium 137 135 - 145 mmol/L   Potassium 4.0 3.5 - 5.1 mmol/L   Chloride 99 98 - 111 mmol/L   CO2 31 22 - 32 mmol/L   Glucose, Bld 157 (H) 70 - 99 mg/dL   BUN 15 6 - 20 mg/dL   Creatinine, Ser 0.60 0.44 - 1.00 mg/dL   Calcium 8.7 (L) 8.9 - 10.3 mg/dL   GFR calc non Af Amer >60 >60 mL/min   GFR calc Af Amer >60 >60 mL/min   Anion gap 7 5 - 15    CT Head Wo Contrast  Result Date: 12/06/2019 CLINICAL DATA:  Head trauma EXAM: CT HEAD WITHOUT CONTRAST TECHNIQUE: Contiguous axial images were obtained from the base of the skull through the vertex without intravenous contrast. COMPARISON:  MRI 09/13/2019, CT brain 09/12/2019 FINDINGS: Brain: No acute territorial infarction, hemorrhage or intracranial mass. Mild atrophy.  Stable ventricle size Vascular: No hyperdense vessels.  Carotid vascular calcification Skull: Normal. Negative for fracture or focal lesion. Sinuses/Orbits: Postsurgical changes of the maxillary and ethmoid sinuses with mild residual thickening Other: Left frontal scalp laceration IMPRESSION: 1. No CT evidence for acute intracranial abnormality. 2. Atrophy Electronically Signed   By: Donavan Foil M.D.   On: 12/06/2019 03:24   DG Pelvis Portable  Result Date: 12/06/2019 CLINICAL DATA:  Postop check. EXAM: PORTABLE PELVIS 1-2 VIEWS COMPARISON:  Earlier today FINDINGS: Relocated left hip arthroplasty with well-seated arthroplasty. Mild irregularity at the greater trochanter which is unchanged from preoperative study. Remote right inferior pubic ramus fracture. IMPRESSION: Relocated left hip arthroplasty. Electronically Signed   By: Monte Fantasia M.D.   On: 12/06/2019 07:06   DG Chest Port 1 View  Result Date: 12/06/2019 CLINICAL DATA:  Preop. EXAM: PORTABLE CHEST 1 VIEW COMPARISON:  07/09/2018 FINDINGS: Right-sided PICC with tip at the upper cavoatrial  junction. There is no edema, consolidation, effusion, or pneumothorax. Normal heart size and mediastinal contours. Trachea is deviated to the left at the lower neck, likely from chin positioning. IMPRESSION: No evidence of active disease. Electronically Signed   By: Monte Fantasia M.D.   On: 12/06/2019 04:21   DG HIP OPERATIVE UNILAT W OR W/O PELVIS LEFT  Result Date: 12/06/2019 CLINICAL DATA:  Closed reduction of left hip EXAM: OPERATIVE LEFT HIP (WITH PELVIS IF PERFORMED) 1 VIEWS TECHNIQUE: Fluoroscopic spot image(s) were submitted for interpretation post-operatively. COMPARISON:  Radiography from earlier today FINDINGS: Unlabeled study but the patient only has a unilateral arthroplasty on the left. On the single lateral image the prosthesis is located. IMPRESSION: Fluoroscopy for left hip prosthesis reduction. Electronically Signed   By: Monte Fantasia M.D.   On: 12/06/2019 06:27   DG HIP UNILAT WITH PELVIS 2-3 VIEWS LEFT  Result Date: 12/06/2019 CLINICAL DATA:  Left hip pain after fall EXAM: DG HIP (WITH OR WITHOUT PELVIS) 2-3V LEFT COMPARISON:  CT 09/12/2019 FINDINGS: SI joints are non widened. Pubic symphysis and rami appear intact. Patient is status post left hip replacement. There is cranial dislocation of the femoral component with respect to the acetabular cup. No definitive fracture is visualized. IMPRESSION: Status post left hip replacement with superior dislocation of the left femoral component with respect to the acetabular cup Electronically Signed   By: Donavan Foil M.D.   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 Asa Saunas at [facility] who performed surgery   Carlynn Spry , PA-C 12/07/2019, 7:28 AM

## 2019-12-07 NOTE — Progress Notes (Signed)
Physical Therapy Treatment Patient Details Name: [name] MRN: [id number] DOB: [birth year] Today's Date: 12/07/2019    History of Present Illness presented to ER secondary to fall, hitting L hip and head; admitted for management of L hip dislocation (s/p closed reduction 12/06/19) and L scalp laceration.  Original THR 11/13/19 with discharge to STR; had recently been discharged and home 3-4 days prior to this admission    PT Comments    Pt was long sitting in bed upon arriving with abduction pillow in place and SCDs applied. She agrees to PT session and voices concerns about care. Pt reports 9/10 pain but states she is always in pain. Pain does not limit session. She was able to supine to sit with CGA only. Pt reports dizziness upon sitting up that quickly resolved. BP 124/86. She was able to safely stand and ambulate 200 ft with RW without LOB or unsteadiness. Pt is anxious throughout session. Therapist spent a lot of time educating pt on POC and what to expect at DC. She demonstrates safe abilities to DC to home with continued HHPT to follow. [facility] has been treating pt prior to admission. Therapist recommends DC to home with HHPT to continue to progress strength and safe functional mobility. She was long sitting in bed with abduction pillow reapplied and SCDs donned. RN in room when therapist left room.      Follow Up Recommendations  Home health PT     Equipment Recommendations  None recommended by PT    Recommendations for Other Services       Precautions / Restrictions Precautions Precautions: Fall;Posterior Hip Precaution Booklet Issued: Yes (comment) Precaution Comments: hip abduction pillow Restrictions Weight Bearing Restrictions: Yes LLE Weight Bearing: Weight bearing as tolerated    Mobility  Bed Mobility Overal bed mobility: Needs Assistance Bed Mobility: Supine to Sit     Supine to sit: Min guard     General bed mobility comments: CGA + vcs for exiting and  re-entering bed. Vcs throughotu for technique and sequencing. Pt very slow moving and feels therapist was rushing her throughout session.  Transfers Overall transfer level: Needs assistance Equipment used: Rolling walker (2 wheeled) Transfers: Sit to/from Stand Sit to Stand: Min guard         General transfer comment: Pt demonstrated safe ability to STS from EOB 3 x throughotu session with CGA for safety. cues for handplacement and adhereing to precautions   Ambulation/Gait Ambulation/Gait assistance: Supervision Gait Distance (Feet): 200 Feet Assistive device: Rolling walker (2 wheeled) Gait Pattern/deviations: Step-through pattern;Trunk flexed Gait velocity: decreased   General Gait Details: pt ambulated with slow step through gait pattern with vcs throughout for improved posture and gait kinematics. She tolerated well and demonstrated no LOB or unsteadiness   Stairs             Wheelchair Mobility    Modified Rankin (Stroke Patients Only)       Balance Overall balance assessment: Needs assistance Sitting-balance support: No upper extremity supported;Feet supported Sitting balance-Leahy Scale: Good     Standing balance support: Bilateral upper extremity supported Standing balance-Leahy Scale: Good Standing balance comment: no LOB even with static standing without UE support. Does require use of RW in dynamic activities.                            Cognition Arousal/Alertness: Awake/alert Behavior During Therapy: WFL for tasks assessed/performed Overall Cognitive Status: Within Functional Limits for tasks  assessed                                 General Comments: Pt was long sitting in bed upon arriving. frustrated with RN. she required re-education of hip precautions but was able to state understanding.      Exercises      General Comments        Pertinent Vitals/Pain Pain Assessment: 0-10 Pain Score: 9  Pain Location: L  hip Pain Descriptors / Indicators: Aching;Guarding;Grimacing Pain Intervention(s): Limited activity within patient's tolerance;Monitored during session;Premedicated before session;Repositioned;Ice applied    Home Living                      Prior Function            PT Goals (current goals can now be found in the care plan section) Acute Rehab PT Goals Patient Stated Goal: to return home Progress towards PT goals: Progressing toward goals    Frequency    7X/week      PT Plan Current plan remains appropriate    Co-evaluation              AM-PAC PT "6 Clicks" Mobility   Outcome Measure  Help needed turning from your back to your side while in a flat bed without using bedrails?: None Help needed moving from lying on your back to sitting on the side of a flat bed without using bedrails?: A Little Help needed moving to and from a bed to a chair (including a wheelchair)?: A Little Help needed standing up from a chair using your arms (e.g., wheelchair or bedside chair)?: A Little Help needed to walk in hospital room?: A Little Help needed climbing 3-5 steps with a railing? : A Little 6 Click Score: 19    End of Session Equipment Utilized During Treatment: Gait belt Activity Tolerance: Patient tolerated treatment well Patient left: in bed;with call bell/phone within reach;with bed alarm set;with nursing/sitter in room;with SCD's reapplied Nurse Communication: Mobility status PT Visit Diagnosis: Muscle weakness (generalized) (M62.81);Difficulty in walking, not elsewhere classified (R26.2);Pain Pain - Right/Left: Left Pain - part of body: Hip     Time: [id number] PT Time Calculation (min) (ACUTE ONLY): 39 min  Charges:  $Gait Training: 8-22 mins $Therapeutic Exercise: 8-22 mins $Therapeutic Activity: 8-22 mins                     Julaine Fusi PTA 12/07/19, 10:05 AM

## 2019-12-07 NOTE — TOC Progression Note (Signed)
Transition of Care [facility]) - Progression Note    Patient Details  Name: [name] MRN: [id number] Date of Birth: [birth year]  Transition [facility] CM/SW Contact  Su Hilt, RN Phone Number: 12/07/2019, 9:32 AM  Clinical Narrative:    Received call from [place] with [facility] stating that they are open already with the patient for [facility] services        Expected Discharge Plan and Services                                                 Social Determinants of Health (SDOH) Interventions    Readmission Risk Interventions No flowsheet data found.

## 2020-03-14 ENCOUNTER — Other Ambulatory Visit: Payer: Self-pay

## 2020-03-14 ENCOUNTER — Telehealth: Payer: Self-pay

## 2020-03-14 ENCOUNTER — Encounter: Payer: Self-pay | Admitting: Podiatry

## 2020-03-14 ENCOUNTER — Ambulatory Visit: Payer: Medicare Other | Admitting: Podiatry

## 2020-03-14 DIAGNOSIS — L03031 Cellulitis of right toe: Secondary | ICD-10-CM

## 2020-03-14 DIAGNOSIS — L600 Ingrowing nail: Secondary | ICD-10-CM | POA: Diagnosis not present

## 2020-03-14 MED ORDER — DOXYCYCLINE HYCLATE 100 MG PO TABS: 100.0000 mg | ORAL_TABLET | Freq: Two times a day (BID) | ORAL | 0 refills | Status: DC

## 2020-03-14 NOTE — Telephone Encounter (Signed)
Pt had her toenail removed and was reading the instruction but they were not clear. Pt was concerned about showering and forgot to ask the nursing assistant.

## 2020-03-14 NOTE — Progress Notes (Signed)
Subjective:   Patient ID: [name], female   DOB: 59 y.o.   MRN: [id number]   HPI Patient presents stating that she has had trauma to her right big toe and its turn red the nail has gotten thickened and it is painful when pressed.  She does not remember what else may have happened but she does not have good mental capacity and she does present with caregiver.  Patient smokes half pack per day and is not currently active   Review of Systems  All other systems reviewed and are negative.       Objective:  Physical Exam Vitals and nursing note reviewed.  Constitutional:      Appearance: She is well-developed.  Pulmonary:     Effort: Pulmonary effort is normal.  Musculoskeletal:        General: Normal range of motion.  Skin:    General: Skin is warm.  Neurological:     Mental Status: She is alert.     Neurovascular status intact muscle strength was found to be adequate range of motion within normal limits.  Patient has a traumatized right hallux with redness of the toe itself thickness of the nailbed loosening of the nailbed and drainage of the underlying surface localized.  No proximal edema erythema or drainage was noted and patient was also concerned about the left big toenail which has mild deformity     Assessment:  Paronychia infection right hallux localized but involving the entire nail bed both medial lateral and the center portion of the nail along with probable low-grade fungal infection     Plan:  H&P all conditions reviewed.  At this point I have recommended nail removal and evacuating the abscess and I did anesthetized the digit 60 mg like Marcaine mixture sterile prep applied and using sterile instrumentation I completely remove the nail I flushed the bed copiously remove necrotic tissue from both medial lateral and central portions of the nailbed and then placed on doxycycline twice daily and gave instructions for soaks and applied sterile dressing.  This should  heal uneventfully with strict instructions given if any continued redness or indications of systemic infection were to occur she is to contact us immediately and her caregiver understands this also

## 2020-03-14 NOTE — Patient Instructions (Signed)

## 2020-03-15 ENCOUNTER — Telehealth: Payer: Self-pay | Admitting: *Deleted

## 2020-03-15 NOTE — Telephone Encounter (Signed)
Keely:  I called patient back this morning regarding her ingrown toenail.  Thank you, Rolly Pancake, CMA (AAMA)

## 2020-03-15 NOTE — Telephone Encounter (Signed)
Thank you :)

## 2020-03-15 NOTE — Telephone Encounter (Signed)
Called patient back this morning to answer questions they had about their ingrown toenail procedure they had done yesterday with Dr. Paulla Dolly.  Advised patient to start soaking her toes today, twice a day, rinse off with water, gently dry toenail off, and then put a small amount of neosporin on a band-aid and cover nail.  Told patient to do this twice a day for two weeks. If there is any further drainage after two weeks, to continue to soak nail in epsom salt.  Patient stated they understood.

## 2020-09-27 ENCOUNTER — Other Ambulatory Visit: Payer: Self-pay | Admitting: Adult Health Nurse Practitioner

## 2020-09-27 DIAGNOSIS — Z981 Arthrodesis status: Secondary | ICD-10-CM

## 2020-09-27 DIAGNOSIS — M542 Cervicalgia: Secondary | ICD-10-CM

## 2020-09-27 DIAGNOSIS — M79602 Pain in left arm: Secondary | ICD-10-CM

## 2020-10-14 ENCOUNTER — Ambulatory Visit
Admission: RE | Admit: 2020-10-14 | Discharge: 2020-10-14 | Disposition: A | Discharge status: Home / Self Care | Payer: Medicare Other | Source: Ambulatory Visit | Attending: Adult Health Nurse Practitioner | Admitting: Adult Health Nurse Practitioner

## 2020-10-14 ENCOUNTER — Other Ambulatory Visit: Payer: Self-pay

## 2020-10-14 DIAGNOSIS — Z981 Arthrodesis status: Secondary | ICD-10-CM

## 2020-10-14 DIAGNOSIS — M542 Cervicalgia: Secondary | ICD-10-CM | POA: Diagnosis present

## 2020-10-14 DIAGNOSIS — M79602 Pain in left arm: Secondary | ICD-10-CM

## 2020-11-07 ENCOUNTER — Emergency Department: Payer: Medicare Other

## 2020-11-07 ENCOUNTER — Emergency Department
Admission: EM | Admit: 2020-11-07 | Discharge: 2020-11-07 | Disposition: A | Discharge status: Home / Self Care | Payer: Medicare Other | Attending: Student in an Organized Health Care Education/Training Program | Admitting: Student in an Organized Health Care Education/Training Program

## 2020-11-07 ENCOUNTER — Other Ambulatory Visit: Payer: Self-pay

## 2020-11-07 DIAGNOSIS — Z7984 Long term (current) use of oral hypoglycemic drugs: Secondary | ICD-10-CM | POA: Insufficient documentation

## 2020-11-07 DIAGNOSIS — W01198A Fall on same level from slipping, tripping and stumbling with subsequent striking against other object, initial encounter: Secondary | ICD-10-CM | POA: Diagnosis not present

## 2020-11-07 DIAGNOSIS — F17210 Nicotine dependence, cigarettes, uncomplicated: Secondary | ICD-10-CM | POA: Insufficient documentation

## 2020-11-07 DIAGNOSIS — I2510 Atherosclerotic heart disease of native coronary artery without angina pectoris: Secondary | ICD-10-CM | POA: Diagnosis not present

## 2020-11-07 DIAGNOSIS — Z79899 Other long term (current) drug therapy: Secondary | ICD-10-CM | POA: Insufficient documentation

## 2020-11-07 DIAGNOSIS — S59911A Unspecified injury of right forearm, initial encounter: Secondary | ICD-10-CM | POA: Diagnosis present

## 2020-11-07 DIAGNOSIS — F0390 Unspecified dementia without behavioral disturbance: Secondary | ICD-10-CM | POA: Diagnosis not present

## 2020-11-07 DIAGNOSIS — S0990XA Unspecified injury of head, initial encounter: Secondary | ICD-10-CM | POA: Diagnosis not present

## 2020-11-07 DIAGNOSIS — S300XXA Contusion of lower back and pelvis, initial encounter: Secondary | ICD-10-CM | POA: Insufficient documentation

## 2020-11-07 DIAGNOSIS — Z85828 Personal history of other malignant neoplasm of skin: Secondary | ICD-10-CM | POA: Insufficient documentation

## 2020-11-07 DIAGNOSIS — W19XXXA Unspecified fall, initial encounter: Secondary | ICD-10-CM

## 2020-11-07 DIAGNOSIS — S51811A Laceration without foreign body of right forearm, initial encounter: Secondary | ICD-10-CM

## 2020-11-07 DIAGNOSIS — Z7951 Long term (current) use of inhaled steroids: Secondary | ICD-10-CM | POA: Diagnosis not present

## 2020-11-07 DIAGNOSIS — E119 Type 2 diabetes mellitus without complications: Secondary | ICD-10-CM | POA: Insufficient documentation

## 2020-11-07 LAB — CBC WITH DIFFERENTIAL/PLATELET
Abs Immature Granulocytes: 0.02 10*3/uL (ref 0.00–0.07)
Basophils Absolute: 0 10*3/uL (ref 0.0–0.1)
Basophils Relative: 0 %
Eosinophils Absolute: 0.1 10*3/uL (ref 0.0–0.5)
Eosinophils Relative: 2 %
HCT: 36.6 % (ref 36.0–46.0)
Hemoglobin: 11.9 g/dL — ABNORMAL LOW (ref 12.0–15.0)
Immature Granulocytes: 0 %
Lymphocytes Relative: 30 %
Lymphs Abs: 2.2 10*3/uL (ref 0.7–4.0)
MCH: 29.3 pg (ref 26.0–34.0)
MCHC: 32.5 g/dL (ref 30.0–36.0)
MCV: 90.1 fL (ref 80.0–100.0)
Monocytes Absolute: 0.5 10*3/uL (ref 0.1–1.0)
Monocytes Relative: 7 %
Neutro Abs: 4.4 10*3/uL (ref 1.7–7.7)
Neutrophils Relative %: 61 %
Platelets: 242 10*3/uL (ref 150–400)
RBC: 4.06 MIL/uL (ref 3.87–5.11)
RDW: 12.9 % (ref 11.5–15.5)
WBC: 7.3 10*3/uL (ref 4.0–10.5)
nRBC: 0 % (ref 0.0–0.2)

## 2020-11-07 LAB — URINE DRUG SCREEN, QUALITATIVE (ARMC ONLY)
Amphetamines, Ur Screen: NOT DETECTED
Barbiturates, Ur Screen: NOT DETECTED
Benzodiazepine, Ur Scrn: NOT DETECTED
Cannabinoid 50 Ng, Ur ~~LOC~~: NOT DETECTED
Cocaine Metabolite,Ur ~~LOC~~: NOT DETECTED
MDMA (Ecstasy)Ur Screen: NOT DETECTED
Methadone Scn, Ur: NOT DETECTED
Opiate, Ur Screen: POSITIVE — AB
Phencyclidine (PCP) Ur S: NOT DETECTED
Tricyclic, Ur Screen: POSITIVE — AB

## 2020-11-07 LAB — COMPREHENSIVE METABOLIC PANEL
ALT: 19 U/L (ref 0–44)
AST: 19 U/L (ref 15–41)
Albumin: 4.1 g/dL (ref 3.5–5.0)
Alkaline Phosphatase: 76 U/L (ref 38–126)
Anion gap: 11 (ref 5–15)
BUN: 12 mg/dL (ref 6–20)
CO2: 24 mmol/L (ref 22–32)
Calcium: 9.4 mg/dL (ref 8.9–10.3)
Chloride: 104 mmol/L (ref 98–111)
Creatinine, Ser: 0.77 mg/dL (ref 0.44–1.00)
GFR, Estimated: >60 mL/min (ref >60)
Glucose, Bld: 103 mg/dL — ABNORMAL HIGH (ref 70–99)
Potassium: 4.1 mmol/L (ref 3.5–5.1)
Sodium: 139 mmol/L (ref 135–145)
Total Bilirubin: 0.5 mg/dL (ref 0.3–1.2)
Total Protein: 7.1 g/dL (ref 6.5–8.1)

## 2020-11-07 LAB — URINALYSIS, COMPLETE (UACMP) WITH MICROSCOPIC
Bacteria, UA: NONE SEEN
Bilirubin Urine: NEGATIVE
Glucose, UA: NEGATIVE mg/dL
Hgb urine dipstick: NEGATIVE
Ketones, ur: NEGATIVE mg/dL
Leukocytes,Ua: NEGATIVE
Nitrite: NEGATIVE
Protein, ur: NEGATIVE mg/dL
Specific Gravity, Urine: 1.001 — ABNORMAL LOW (ref 1.005–1.030)
Squamous Epithelial / HPF: NONE SEEN (ref 0–5)
pH: 6 (ref 5.0–8.0)

## 2020-11-07 LAB — CORTISOL: Cortisol, Plasma: 1.7 ug/dL

## 2020-11-07 LAB — TROPONIN I (HIGH SENSITIVITY)
Troponin I (High Sensitivity): 4 ng/L (ref <18)
Troponin I (High Sensitivity): 5 ng/L (ref <18)

## 2020-11-07 LAB — LACTIC ACID, PLASMA
Lactic Acid, Venous: 2.2 mmol/L (ref 0.5–1.9)
Lactic Acid, Venous: 1.7 mmol/L (ref 0.5–1.9)

## 2020-11-07 MED ORDER — OXYCODONE-ACETAMINOPHEN 5-325 MG PO TABS
1.0000 | ORAL_TABLET | Freq: Once | ORAL | Status: AC
  Administered 2020-11-07: 1 via ORAL
  Filled 2020-11-07: qty 1

## 2020-11-07 MED ORDER — MORPHINE SULFATE (PF) 4 MG/ML IV SOLN
4.0000 mg | Freq: Once | INTRAVENOUS | Status: AC
  Administered 2020-11-07: 4 mg via INTRAVENOUS
  Filled 2020-11-07: qty 1

## 2020-11-07 MED ORDER — ONDANSETRON HCL 4 MG/2ML IJ SOLN
4.0000 mg | Freq: Once | INTRAMUSCULAR | Status: AC
  Administered 2020-11-07: 4 mg via INTRAVENOUS
  Filled 2020-11-07: qty 2

## 2020-11-07 MED ORDER — SODIUM CHLORIDE 0.9 % IV BOLUS
1000.0000 mL | Freq: Once | INTRAVENOUS | Status: AC
  Administered 2020-11-07: 1000 mL via INTRAVENOUS

## 2020-11-07 NOTE — ED Notes (Signed)
Pt back from CT

## 2020-11-07 NOTE — ED Notes (Signed)
Pt to CT

## 2020-11-07 NOTE — ED Notes (Signed)
Lab called. Lactic is 2.2. Manuela Schwartz, [place], informed face to face. No new orders at this time, only yo continue with plan for IV hydration and recheck lactic.

## 2020-11-07 NOTE — ED Notes (Signed)
Provided water per pt request.

## 2020-11-07 NOTE — ED Triage Notes (Signed)
Pt to ED POV for fall/possible syncopal episode on Tuesday. C/o headache, old skin tears to right arm. Unsure of blood thinner use.  Pt appears disoriented when asking questions Manuela Schwartz PA at bedside

## 2020-11-07 NOTE — ED Notes (Signed)
EDP at bedside.  ED tech obtaining orthostatic VS.  Pt provided urine sample already. Sent to lab.

## 2020-11-07 NOTE — ED Notes (Signed)
Wrapped R arm skin tears per pt request.

## 2020-11-07 NOTE — ED Notes (Signed)
Orthostatic VS Pulse 91 SpO2 94 BP 131/109 RR 30

## 2020-11-07 NOTE — ED Notes (Signed)
Cortisol collected and sent in red top per lab instructions.

## 2020-11-07 NOTE — ED Provider Notes (Signed)
[facility] Emergency Department Provider Note  ____________________________________________   Event Date/Time   First MD Initiated Contact with Patient 11/07/20 1125     (approximate)  I have reviewed the triage vital signs and the nursing notes.   HISTORY  Chief Complaint Fall    HPI [name] is a 60 y.o. female presents emergency department by private vehicle.  Patient states that on Tuesday she fell and hit her head and then woke up on the living room for the next morning.  States she is unsure if she passed out or if she tripped and fell hitting her head.  Patient is complaining of a headache, multiple skin tears to the right arm, back pain, confusion.  Patient has history of  Cushing's disease, chronic pain, multiple falls, etc. see past medical history in the chart   Past Medical History:  Diagnosis Date  . Adrenal insufficiency ([place])   . Anxiety   . CAD (coronary artery disease)   . Cancer ([place])    skin  . Chronic diastolic CHF (congestive heart failure) ([place])   . Chronic pain   . COPD (chronic obstructive pulmonary disease) ([place])   . Cushing disease ([place])   . Dementia ([place])   . GERD (gastroesophageal reflux disease)   . HLD (hyperlipidemia)     Patient Active Problem List   Diagnosis Date Noted  . Dislocation of internal left hip prosthesis ([place]) 12/06/2019  . Preoperative clearance 12/06/2019  . Accidental fall 12/06/2019  . Scalp laceration, initial encounter 12/06/2019  . Type 2 diabetes mellitus without complication ([place]) 00/93/8182  . Dementia without behavioral disturbance ([place]) 12/06/2019  . S/P hip replacement, left 12/06/2019  . Injury of conjunctiva and corneal abrasion of right eye without foreign body 11/19/2019  . Intractable nausea and vomiting 09/12/2019  . Vertigo 09/12/2019  . Chronic diastolic CHF (congestive heart failure) ([place]) 09/12/2019  . Current chronic use of systemic steroids 09/12/2019  . AKI (acute  kidney injury) ([place]) 09/12/2019  . Hypokalemia due to excessive gastrointestinal loss of potassium 09/12/2019  . 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 Hallucinations, visual 10/19/2018  . Tobacco abuse 03/08/2018  . Iron deficiency 11/17/2017  . Abdominal pain 09/07/2017  . Diarrhea   . Secondary adrenal insufficiency ([place]) 07/26/2017  . Flexor tenosynovitis of finger 05/15/2017  . Sepsis ([place]) 05/15/2017  . Early onset Alzheimer's dementia without behavioral disturbance ([place]) 02/16/2017  . Low back pain 02/09/2017  . Radiculopathy of lumbosacral region 01/07/2017  . Neuropathy 11/16/2016  . Disturbance of sleep 10/28/2016  . Pharyngeal dysphagia 09/24/2016  . Ulcer of left lower extremity with fat layer exposed ([place]) 09/24/2016  . Venous insufficiency 09/24/2016  . Sensorineural hearing loss (SNHL) of both ears 08/20/2016  . Sinusitis chronic, ethmoidal 08/20/2016  . At high risk for falls 07/21/2016  . 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 History of fall within past 90 days 07/21/2016  . Osteopenia 07/21/2016  . Polyneuropathy, idiopathic progressive 07/21/2016  . Sleep walking 07/21/2016  . Malignant melanoma of lower leg, left ([place]) 11/05/2015  . Chest pain 06/17/2015  . Elevated troponin 06/17/2015  . Fracture, ribs 06/17/2015  . GERD (gastroesophageal reflux disease) 06/17/2015  . COPD (chronic obstructive pulmonary disease) ([place]) 06/17/2015  . Chronic pain 06/17/2015  . Anxiety 06/17/2015  . Adrenal insufficiency ([place]) 06/17/2015  . Allergic rhinitis due to allergen 10/02/2011  Past Surgical History:  Procedure Laterality Date  . APPENDECTOMY    . HIP CLOSED REDUCTION Left 12/06/2019   Procedure: CLOSED MANIPULATION HIP;  Surgeon: Lovell Sheehan, MD;  Location: [facility] ORS;  Service: Orthopedics;  Laterality: Left;  . NASAL SEPTUM SURGERY    . NASAL SINUS SURGERY    .  UMBILICAL HERNIA REPAIR    . VAGINAL HYSTERECTOMY      Prior to Admission medications   Medication Sig Start Date End Date Taking? Authorizing Provider  ACCU-CHEK AVIVA PLUS test strip  09/25/19   [provider]  albuterol (PROAIR HFA) 108 (90 BASE) MCG/ACT inhaler Inhale 2 puffs into the lungs every 6 (six) hours as needed. 06/04/15   [provider]  ALPRAZolam Duanne Moron) 1 MG tablet Take 1 mg by mouth 4 (four) times daily.     [provider]  benzonatate (TESSALON) 200 MG capsule Take by mouth. 03/01/20   [provider]  buPROPion (WELLBUTRIN SR) 100 MG 12 hr tablet Take 100 mg by mouth 2 (two) times daily. 08/16/19   [provider]  Calcium Carbonate-Vitamin D 600-400 MG-UNIT tablet Take 1 tablet by mouth daily. 05/22/15 09/07/17  [provider]  cyclobenzaprine (FLEXERIL) 10 MG tablet Take 10 mg by mouth 3 (three) times daily as needed.     [provider]  donepezil (ARICEPT) 10 MG tablet Take 10 mg by mouth at bedtime.    [provider]  doxepin (SINEQUAN) 25 MG capsule Take by mouth. 03/01/20   [provider]  doxepin (SINEQUAN) 50 MG capsule Take by mouth. 03/10/18   [provider]  doxycycline (MONODOX) 100 MG capsule Take 100 mg by mouth 2 (two) times daily. 09/25/19   [provider]  doxycycline (VIBRA-TABS) 100 MG tablet Take 1 tablet (100 mg total) by mouth 2 (two) times daily. 03/14/20   Wallene Huh, DPM  EPINEPHrine 0.3 mg/0.3 mL IJ SOAJ injection Inject 0.3 mg into the muscle once. 04/01/18   [provider]  fluticasone (FLONASE) 50 MCG/ACT nasal spray Place 2 sprays into the nose daily. 01/02/13   [provider]  Fluticasone-Salmeterol (ADVAIR DISKUS) 250-50 MCG/DOSE AEPB Inhale 1 puff into the lungs every 12 (twelve) hours. 06/04/15 09/07/17  [provider]  furosemide (LASIX) 80 MG tablet Take by mouth. 03/01/20   [provider]  gabapentin  (NEURONTIN) 600 MG tablet Take 1 tablet by mouth 3 (three) times daily.    [provider]  glipiZIDE (GLUCOTROL) 5 MG tablet Take 5 mg by mouth daily. 08/16/19   [provider]  hydrOXYzine (ATARAX/VISTARIL) 25 MG tablet Take 25 mg by mouth 3 (three) times daily as needed for itching. 03/23/18   [provider]  magnesium oxide (MAG-OX) 400 MG tablet Take 400 mg by mouth daily.    [provider]  meclizine (ANTIVERT) 25 MG tablet Take 1 tablet (25 mg total) by mouth 3 (three) times daily as needed for dizziness. 09/14/19   Danford, Suann Larry, MD  memantine (NAMENDA) 5 MG tablet Take by mouth. 03/01/20   [provider]  NARCAN 4 MG/0.1ML LIQD nasal spray kit SMARTSIG:Both Nares 03/01/20   [provider]  omeprazole (PRILOSEC) 40 MG capsule Take 1 capsule by mouth 2 (two) times daily. 02/19/15 09/07/17  [provider]  ondansetron (ZOFRAN-ODT) 4 MG disintegrating tablet Take 1 tablet (4 mg total) by mouth every 8 (eight) hours as needed for nausea or vomiting. 09/14/19   Myrene Buddy  P, MD  oxyCODONE-acetaminophen (PERCOCET) 10-325 MG tablet Take 1 tablet by mouth every 4 (four) hours as needed for pain (pt has to take [place] brand).  10/05/12   [provider]  potassium chloride SA (K-DUR,KLOR-CON) 20 MEQ tablet Take 1 tablet (20 mEq total) by mouth 2 (two) times daily. 09/09/17   Epifanio Lesches, MD  predniSONE (DELTASONE) 5 MG tablet Take 15 mg by mouth daily with breakfast.    [provider]  PREMARIN vaginal cream Place vaginally. 03/01/20   [provider]  promethazine (PHENERGAN) 25 MG tablet Take 1 tablet (25 mg total) by mouth every 8 (eight) hours as needed for nausea or vomiting. 09/14/19   Danford, Suann Larry, MD  vancomycin (VANCOCIN) 1 g injection SMARTSIG:IV 12/27/19   [provider]  vancomycin (VANCOCIN) 5000 MG injection Inject into the vein. 12/21/19   [provider]    Allergies Bupropion, Levaquin [levofloxacin in d5w], Methylprednisolone acetate, Amoxicillin, Codeine, Cymbalta [duloxetine hcl], Gatifloxacin, Guaifenesin & derivatives, Keflex [cephalexin], Klonopin [clonazepam], Macrobid [nitrofurantoin monohyd macro], Penicillins, Sulfa antibiotics, Amitriptyline, and Other  Family History  Problem Relation Age of Onset  . Ovarian cancer Mother   . Breast cancer Mother   . Brain cancer Mother   . CAD Father   . Diabetes Father   . Hepatitis Father   . Liver disease Father   . Cancer Father   . Alcohol abuse Father   . Heart attack Brother   . Sleep apnea Brother   . Diabetes Brother   . ALS Paternal Grandmother   . Stroke Maternal Grandmother     Social History Social History   Tobacco Use  . Smoking status: Current Every Day Smoker    Packs/day: 0.50    Types: Cigarettes  . Smokeless tobacco: Never Used  Vaping Use  . Vaping Use: Never used  Substance Use Topics  . Alcohol use: Yes    Alcohol/week: 0.0 standard drinks    Comment: rarely  . Drug use: No    Review of Systems  Constitutional: No fever/chills Eyes: No visual changes. ENT: No sore throat. Respiratory: Denies cough Cardiovascular: Denies chest pain Gastrointestinal: Denies abdominal pain Genitourinary: Negative for dysuria. Musculoskeletal: Positive for back pain. Neuro: Altered mental status Skin: Negative for rash. Psychiatric: no mood changes,     ____________________________________________   PHYSICAL EXAM:  VITAL SIGNS: ED Triage Vitals  Enc Vitals Group     BP 11/07/20 1131 (!) 147/83     Pulse Rate 11/07/20 1132 92     Resp 11/07/20 1131 18     Temp 11/07/20 1131 98.8 F (37.1 C)     Temp Source 11/07/20 1131 Oral     SpO2 11/07/20 1131 96 %     Weight 11/07/20 1132 150 lb (68 kg)     Height 11/07/20 1132 $RemoveBefor'5\' 4"'[id number]$  (1.626 m)     Head Circumference --      Peak Flow --      Pain Score --      Pain Loc --      Pain Edu?  --      Excl. in [place]? --     Constitutional: Alert and oriented. Well appearing and in no acute distress. Eyes: Conjunctivae are normal.  Head: Skull is tender to palpation Nose: No congestion/rhinnorhea. Mouth/Throat: Mucous membranes are moist.   Neck:  supple no lymphadenopathy noted Cardiovascular: Normal rate, regular rhythm. Heart sounds are normal Respiratory: Normal respiratory effort.  No retractions, lungs c  t a  Abd: soft nontender bs normal all 4 quad GU: deferred Musculoskeletal: FROM all extremities, warm and well perfused, bruising noted to the left lower back, area is tender to palpation, extremities have no bony tenderness, neurovascular is intact, patient is able to ambulate Neurologic:  Normal speech and language.  Patient seems to be slightly confused and will forget what you Aster within a few minutes Skin:  Skin is warm, dry and intact. No rash noted. Psychiatric: Mood and affect are normal. Speech and behavior are normal.  ____________________________________________   LABS (all labs ordered are listed, but only abnormal results are displayed)  Labs Reviewed  COMPREHENSIVE METABOLIC PANEL - Abnormal; Notable for the following components:      Result Value   Glucose, Bld 103 (*)    All other components within normal limits  LACTIC ACID, PLASMA - Abnormal; Notable for the following components:   Lactic Acid, Venous 2.2 (*)    All other components within normal limits  CBC WITH DIFFERENTIAL/PLATELET - Abnormal; Notable for the following components:   Hemoglobin 11.9 (*)    All other components within normal limits  URINALYSIS, COMPLETE (UACMP) WITH MICROSCOPIC - Abnormal; Notable for the following components:   Color, Urine COLORLESS (*)    APPearance CLEAR (*)    Specific Gravity, Urine 1.001 (*)    All other components within normal limits  URINE DRUG SCREEN, QUALITATIVE ([facility] ONLY) - Abnormal; Notable for the following components:   Tricyclic, Ur Screen  POSITIVE (*)    Opiate, Ur Screen POSITIVE (*)    All other components within normal limits  LACTIC ACID, PLASMA  CORTISOL  TROPONIN I (HIGH SENSITIVITY)  TROPONIN I (HIGH SENSITIVITY)   ____________________________________________   ____________________________________________  RADIOLOGY  CT of the head X-ray T-spine and L-spine  ____________________________________________   PROCEDURES  Procedure(s) performed: No  Procedures    ____________________________________________   INITIAL IMPRESSION / ASSESSMENT AND PLAN / ED COURSE  Pertinent labs & imaging results that were available during my care of the patient were reviewed by me and considered in my medical decision making (see chart for details).   The patient is a 60 year old female presents after fall.  See HPI.  Physical exam shows patient to appear stable at this time.  DDx: Subdural, subarachnoid, UTI, sepsis, spinal fracture  CBC, metabolic panel, troponin, lactic acid, urinalysis, CT of the head and x-rays of the lumbar and T-spine.   Patient's labs are reassuring except for her lactic acid is increased.  Do not feel the patient has a bacterial infection at this time to start fluids.  Recheck lactic acid.  Imaging is reassuring.  CT of the head and C-spine show chronic changes but no acute X-ray of the lumbar and thoracic spines are negative for any acute abnormalities, images were reviewed by me and confirmed by radiology  At this time patient is being given fluids and pain medication.  ----------------------------------------- 3:52 PM on 11/07/2020 -----------------------------------------  Recheck of labs showed him to all be normal.  Troponin 1 and 2 were normal, lactic acid #2 was normal UDS shows tricyclics along with opiates.  Opiates could be from the morphine we gave her earlier.  Urinalysis is normal.  I did explain all the findings to the patient She seems to feel much better and is more  talkative than she was previously..  She is to follow-up with her regular doctor as needed.  Return emergency department worsening.  She was given instructions on how to  prevent falls in the home.   [name] was evaluated in Emergency Department on 11/07/2020 for the symptoms described in the history of present illness. She was evaluated in the context of the global COVID-19 pandemic, which necessitated consideration that the patient might be at risk for infection with the SARS-CoV-2 virus that causes COVID-19. Institutional protocols and algorithms that pertain to the evaluation of patients at risk for COVID-19 are in a state of rapid change based on information released by regulatory bodies including the CDC and federal and state organizations. These policies and algorithms were followed during the patient's care in the ED.    As part of my medical decision making, I reviewed the following data within the [facility] notes reviewed and incorporated, Labs reviewed , EKG interpreted NSR, Old chart reviewed, Radiograph reviewed , Evaluated by EM attending , Notes from prior ED visits and [place] Controlled Substance Database  ____________________________________________   FINAL CLINICAL IMPRESSION(S) / ED DIAGNOSES  Final diagnoses:  Fall, initial encounter  Minor head injury, initial encounter  Skin tear of right forearm without complication, initial encounter      NEW MEDICATIONS STARTED DURING THIS VISIT:  New Prescriptions   No medications on file     Note:  This document was prepared using Dragon voice recognition software and may include unintentional dictation errors.    Versie Starks, PA-C 11/07/20 1554    Merlyn Lot, MD 11/07/20 [phone]

## 2020-11-07 NOTE — Discharge Instructions (Addendum)
Regular doctor if not improving to 3 days.  Return emergency department worsening.

## 2020-11-20 ENCOUNTER — Ambulatory Visit: Payer: Self-pay

## 2020-11-20 NOTE — Telephone Encounter (Signed)
Pt. States she was in ED 11/07/20. States she received a copy of her labs. Asking for Cortisol normal range. Given information. States she will her doctor this information.

## 2020-11-28 ENCOUNTER — Other Ambulatory Visit (HOSPITAL_COMMUNITY): Payer: Self-pay | Admitting: Orthopedic Surgery

## 2020-11-28 ENCOUNTER — Other Ambulatory Visit: Payer: Self-pay | Admitting: Orthopedic Surgery

## 2020-11-28 DIAGNOSIS — E049 Nontoxic goiter, unspecified: Secondary | ICD-10-CM

## 2020-12-02 ENCOUNTER — Other Ambulatory Visit: Payer: Self-pay

## 2020-12-02 ENCOUNTER — Ambulatory Visit (HOSPITAL_COMMUNITY)
Admission: RE | Admit: 2020-12-02 | Discharge: 2020-12-02 | Disposition: A | Discharge status: Home / Self Care | Payer: Medicare Other | Source: Ambulatory Visit | Attending: Orthopedic Surgery | Admitting: Orthopedic Surgery

## 2020-12-02 DIAGNOSIS — E049 Nontoxic goiter, unspecified: Secondary | ICD-10-CM | POA: Diagnosis not present

## 2020-12-20 ENCOUNTER — Ambulatory Visit: Payer: Medicare Other

## 2021-01-08 ENCOUNTER — Other Ambulatory Visit: Payer: Self-pay | Admitting: Orthopedic Surgery

## 2021-01-08 DIAGNOSIS — M5417 Radiculopathy, lumbosacral region: Secondary | ICD-10-CM

## 2021-01-08 DIAGNOSIS — M79605 Pain in left leg: Secondary | ICD-10-CM

## 2021-01-08 DIAGNOSIS — M79604 Pain in right leg: Secondary | ICD-10-CM

## 2021-01-19 ENCOUNTER — Other Ambulatory Visit: Payer: Self-pay

## 2021-01-19 ENCOUNTER — Ambulatory Visit
Admission: RE | Admit: 2021-01-19 | Discharge: 2021-01-19 | Disposition: A | Discharge status: Home / Self Care | Payer: Medicare Other | Source: Ambulatory Visit | Attending: Orthopedic Surgery | Admitting: Orthopedic Surgery

## 2021-01-19 DIAGNOSIS — M79604 Pain in right leg: Secondary | ICD-10-CM

## 2021-01-19 DIAGNOSIS — M5417 Radiculopathy, lumbosacral region: Secondary | ICD-10-CM

## 2021-01-19 DIAGNOSIS — M79605 Pain in left leg: Secondary | ICD-10-CM

## 2021-01-19 DIAGNOSIS — M545 Low back pain, unspecified: Secondary | ICD-10-CM | POA: Diagnosis not present

## 2021-01-23 DIAGNOSIS — M438X2 Other specified deforming dorsopathies, cervical region: Secondary | ICD-10-CM | POA: Insufficient documentation

## 2021-02-14 ENCOUNTER — Other Ambulatory Visit (HOSPITAL_COMMUNITY): Payer: Self-pay | Admitting: Internal Medicine

## 2021-02-14 ENCOUNTER — Other Ambulatory Visit: Payer: Self-pay | Admitting: Internal Medicine

## 2021-02-14 DIAGNOSIS — M542 Cervicalgia: Secondary | ICD-10-CM

## 2021-02-14 DIAGNOSIS — R221 Localized swelling, mass and lump, neck: Secondary | ICD-10-CM

## 2021-02-19 ENCOUNTER — Other Ambulatory Visit: Payer: Self-pay

## 2021-02-19 ENCOUNTER — Ambulatory Visit
Admission: RE | Admit: 2021-02-19 | Discharge: 2021-02-19 | Disposition: A | Discharge status: Home / Self Care | Payer: Medicare Other | Source: Ambulatory Visit | Attending: Internal Medicine | Admitting: Internal Medicine

## 2021-02-19 DIAGNOSIS — M542 Cervicalgia: Secondary | ICD-10-CM | POA: Diagnosis present

## 2021-02-19 DIAGNOSIS — R221 Localized swelling, mass and lump, neck: Secondary | ICD-10-CM | POA: Diagnosis present

## 2021-04-03 ENCOUNTER — Other Ambulatory Visit: Payer: Self-pay | Admitting: Nurse Practitioner

## 2021-04-03 DIAGNOSIS — Z1231 Encounter for screening mammogram for malignant neoplasm of breast: Secondary | ICD-10-CM

## 2021-04-11 ENCOUNTER — Other Ambulatory Visit: Payer: Self-pay

## 2021-04-11 ENCOUNTER — Encounter: Payer: Self-pay | Admitting: Emergency Medicine

## 2021-04-11 ENCOUNTER — Emergency Department
Admission: EM | Admit: 2021-04-11 | Discharge: 2021-04-11 | Disposition: A | Discharge status: Home / Self Care | Payer: Medicare Other | Attending: Emergency Medicine | Admitting: Emergency Medicine

## 2021-04-11 DIAGNOSIS — F17210 Nicotine dependence, cigarettes, uncomplicated: Secondary | ICD-10-CM | POA: Diagnosis not present

## 2021-04-11 DIAGNOSIS — E119 Type 2 diabetes mellitus without complications: Secondary | ICD-10-CM | POA: Diagnosis not present

## 2021-04-11 DIAGNOSIS — F0390 Unspecified dementia without behavioral disturbance: Secondary | ICD-10-CM | POA: Diagnosis not present

## 2021-04-11 DIAGNOSIS — J449 Chronic obstructive pulmonary disease, unspecified: Secondary | ICD-10-CM | POA: Diagnosis not present

## 2021-04-11 DIAGNOSIS — Z7951 Long term (current) use of inhaled steroids: Secondary | ICD-10-CM | POA: Diagnosis not present

## 2021-04-11 DIAGNOSIS — S50811A Abrasion of right forearm, initial encounter: Secondary | ICD-10-CM | POA: Insufficient documentation

## 2021-04-11 DIAGNOSIS — Z23 Encounter for immunization: Secondary | ICD-10-CM | POA: Insufficient documentation

## 2021-04-11 DIAGNOSIS — Z96642 Presence of left artificial hip joint: Secondary | ICD-10-CM | POA: Diagnosis not present

## 2021-04-11 DIAGNOSIS — I2510 Atherosclerotic heart disease of native coronary artery without angina pectoris: Secondary | ICD-10-CM | POA: Insufficient documentation

## 2021-04-11 DIAGNOSIS — Z7984 Long term (current) use of oral hypoglycemic drugs: Secondary | ICD-10-CM | POA: Insufficient documentation

## 2021-04-11 DIAGNOSIS — Z85828 Personal history of other malignant neoplasm of skin: Secondary | ICD-10-CM | POA: Insufficient documentation

## 2021-04-11 DIAGNOSIS — I5032 Chronic diastolic (congestive) heart failure: Secondary | ICD-10-CM | POA: Insufficient documentation

## 2021-04-11 DIAGNOSIS — W5501XA Bitten by cat, initial encounter: Secondary | ICD-10-CM | POA: Diagnosis not present

## 2021-04-11 DIAGNOSIS — S59911A Unspecified injury of right forearm, initial encounter: Secondary | ICD-10-CM | POA: Diagnosis present

## 2021-04-11 MED ORDER — DOXYCYCLINE MONOHYDRATE 100 MG PO CAPS: 100.0000 mg | ORAL_CAPSULE | Freq: Two times a day (BID) | ORAL | 0 refills | Status: DC

## 2021-04-11 MED ORDER — TETANUS-DIPHTH-ACELL PERTUSSIS 5-2.5-18.5 LF-MCG/0.5 IM SUSY
0.5000 mL | PREFILLED_SYRINGE | Freq: Once | INTRAMUSCULAR | Status: AC
  Administered 2021-04-11: 0.5 mL via INTRAMUSCULAR
  Filled 2021-04-11: qty 0.5

## 2021-04-11 MED ORDER — METRONIDAZOLE 500 MG PO TABS: 500.0000 mg | ORAL_TABLET | Freq: Three times a day (TID) | ORAL | 0 refills | Status: AC

## 2021-04-11 NOTE — ED Provider Notes (Signed)
[facility] Emergency Department Provider Note   ____________________________________________   Event Date/Time   First MD Initiated Contact with Patient 04/11/21 1221     (approximate)  I have reviewed the triage vital signs and the nursing notes.   HISTORY  Chief Complaint Animal Bite    HPI [name] is a 60 y.o. female patient presenting for cat bite to the right forearm.  Patient also has scratches to the right forearm.  Incident happened 2 days ago.  Patient has been using hydrogen peroxide to clean wounds.  Patient states she is unsure of when she believes that cat shots are up-to-date.  She will again check with her neighbor.  Patient is unsure her last tetanus shot.         Past Medical History:  Diagnosis Date   Adrenal insufficiency (HCC)    Anxiety    CAD (coronary artery disease)    Cancer (HCC)    skin   Chronic diastolic CHF (congestive heart failure) (HCC)    Chronic pain    COPD (chronic obstructive pulmonary disease) ([place])    Cushing disease ([place])    Dementia ([place])    GERD (gastroesophageal reflux disease)    HLD (hyperlipidemia)     Patient Active Problem List   Diagnosis Date Noted   Dislocation of internal left hip prosthesis ([place]) 12/06/2019   Preoperative clearance 12/06/2019   Accidental fall 12/06/2019   Scalp laceration, initial encounter 12/06/2019   Type 2 diabetes mellitus without complication ([place]) 37/29/0211   Dementia without behavioral disturbance ([place]) 12/06/2019   S/P hip replacement, left 12/06/2019   Injury of conjunctiva and corneal abrasion of right eye without foreign body 11/19/2019   Intractable nausea and vomiting 09/12/2019   Vertigo 09/12/2019   Chronic diastolic CHF (congestive heart failure) ([place]) 09/12/2019   Current chronic use of systemic steroids 09/12/2019   AKI (acute kidney injury) ([place]) 09/12/2019   Hypokalemia due to excessive gastrointestinal loss of potassium 09/12/2019    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 03/28/2019   Cervical disc disorder at C4-C5 level with radiculopathy 03/28/2019   Hallucinations, visual 10/19/2018   Tobacco abuse 03/08/2018   Iron deficiency 11/17/2017   Abdominal pain 09/07/2017   Diarrhea    Secondary adrenal insufficiency ([place]) 07/26/2017   Flexor tenosynovitis of finger 05/15/2017   Sepsis ([place]) 05/15/2017   Early onset Alzheimer's dementia without behavioral disturbance ([place]) 02/16/2017   Low back pain 02/09/2017   Radiculopathy of lumbosacral region 01/07/2017   Neuropathy 11/16/2016   Disturbance of sleep 10/28/2016   Pharyngeal dysphagia 09/24/2016   Ulcer of left lower extremity with fat layer exposed ([place]) 09/24/2016   Venous insufficiency 09/24/2016   Sensorineural hearing loss (SNHL) of both ears 08/20/2016   Sinusitis chronic, ethmoidal 08/20/2016   At high risk for falls 07/21/2016   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   History of fall within past 90 days 07/21/2016   Osteopenia 07/21/2016   Polyneuropathy, idiopathic progressive 07/21/2016   Sleep walking 07/21/2016   Malignant melanoma of lower leg, left ([place]) 11/05/2015   Chest pain 06/17/2015   Elevated troponin 06/17/2015   Fracture, ribs 06/17/2015   GERD (gastroesophageal reflux disease) 06/17/2015   COPD (chronic obstructive pulmonary disease) ([place]) 06/17/2015   Chronic pain 06/17/2015   Anxiety 06/17/2015   Adrenal insufficiency ([place]) 06/17/2015   Allergic rhinitis due to allergen 10/02/2011    Past Surgical History:  Procedure Laterality Date   APPENDECTOMY  HIP CLOSED REDUCTION Left 12/06/2019   Procedure: CLOSED MANIPULATION HIP;  Surgeon: Lovell Sheehan, MD;  Location: [facility] ORS;  Service: Orthopedics;  Laterality: Left;   NASAL SEPTUM SURGERY     NASAL SINUS SURGERY     UMBILICAL HERNIA REPAIR     VAGINAL HYSTERECTOMY      Prior to Admission medications   Medication Sig Start Date End Date Taking? Authorizing Provider   doxycycline (MONODOX) 100 MG capsule Take 1 capsule (100 mg total) by mouth 2 (two) times daily. 04/11/21  Yes Sable Feil, PA-C  metroNIDAZOLE (FLAGYL) 500 MG tablet Take 1 tablet (500 mg total) by mouth 3 (three) times daily for 10 days. 04/11/21 04/21/21 Yes Sable Feil, PA-C  ACCU-CHEK AVIVA PLUS test strip  09/25/19   [provider]  albuterol (PROAIR HFA) 108 (90 BASE) MCG/ACT inhaler Inhale 2 puffs into the lungs every 6 (six) hours as needed. 06/04/15   [provider]  ALPRAZolam Duanne Moron) 1 MG tablet Take 1 mg by mouth 4 (four) times daily.     [provider]  benzonatate (TESSALON) 200 MG capsule Take by mouth. 03/01/20   [provider]  buPROPion (WELLBUTRIN SR) 100 MG 12 hr tablet Take 100 mg by mouth 2 (two) times daily. 08/16/19   [provider]  Calcium Carbonate-Vitamin D 600-400 MG-UNIT tablet Take 1 tablet by mouth daily. 05/22/15 09/07/17  [provider]  cyclobenzaprine (FLEXERIL) 10 MG tablet Take 10 mg by mouth 3 (three) times daily as needed.     [provider]  donepezil (ARICEPT) 10 MG tablet Take 10 mg by mouth at bedtime.    [provider]  doxepin (SINEQUAN) 25 MG capsule Take by mouth. 03/01/20   [provider]  doxepin (SINEQUAN) 50 MG capsule Take by mouth. 03/10/18   [provider]  doxycycline (MONODOX) 100 MG capsule Take 100 mg by mouth 2 (two) times daily. 09/25/19   [provider]  doxycycline (VIBRA-TABS) 100 MG tablet Take 1 tablet (100 mg total) by mouth 2 (two) times daily. 03/14/20   Wallene Huh, DPM  EPINEPHrine 0.3 mg/0.3 mL IJ SOAJ injection Inject 0.3 mg into the muscle once. 04/01/18   [provider]  fluticasone (FLONASE) 50 MCG/ACT nasal spray Place 2 sprays into the nose daily. 01/02/13   [provider]  Fluticasone-Salmeterol (ADVAIR DISKUS) 250-50 MCG/DOSE AEPB Inhale 1 puff into the lungs every 12 (twelve) hours. 06/04/15  09/07/17  [provider]  furosemide (LASIX) 80 MG tablet Take by mouth. 03/01/20   [provider]  gabapentin (NEURONTIN) 600 MG tablet Take 1 tablet by mouth 3 (three) times daily.    [provider]  glipiZIDE (GLUCOTROL) 5 MG tablet Take 5 mg by mouth daily. 08/16/19   [provider]  hydrOXYzine (ATARAX/VISTARIL) 25 MG tablet Take 25 mg by mouth 3 (three) times daily as needed for itching. 03/23/18   [provider]  magnesium oxide (MAG-OX) 400 MG tablet Take 400 mg by mouth daily.    [provider]  meclizine (ANTIVERT) 25 MG tablet Take 1 tablet (25 mg total) by mouth 3 (three) times daily as needed for dizziness. 09/14/19   Danford, Suann Larry, MD  memantine (NAMENDA) 5 MG tablet Take by mouth. 03/01/20   [provider]  NARCAN 4 MG/0.1ML LIQD nasal spray kit SMARTSIG:Both Nares 03/01/20   [provider]  omeprazole (PRILOSEC) 40 MG capsule Take 1 capsule by mouth 2 (  two) times daily. 02/19/15 09/07/17  [provider]  ondansetron (ZOFRAN-ODT) 4 MG disintegrating tablet Take 1 tablet (4 mg total) by mouth every 8 (eight) hours as needed for nausea or vomiting. 09/14/19   Danford, Suann Larry, MD  oxyCODONE-acetaminophen (PERCOCET) 10-325 MG tablet Take 1 tablet by mouth every 4 (four) hours as needed for pain (pt has to take [place] brand).  10/05/12   [provider]  potassium chloride SA (K-DUR,KLOR-CON) 20 MEQ tablet Take 1 tablet (20 mEq total) by mouth 2 (two) times daily. 09/09/17   Epifanio Lesches, MD  predniSONE (DELTASONE) 5 MG tablet Take 15 mg by mouth daily with breakfast.    [provider]  PREMARIN vaginal cream Place vaginally. 03/01/20   [provider]  promethazine (PHENERGAN) 25 MG tablet Take 1 tablet (25 mg total) by mouth every 8 (eight) hours as needed for nausea or vomiting. 09/14/19   Danford, Suann Larry, MD  vancomycin (VANCOCIN) 1 g injection  SMARTSIG:IV 12/27/19   [provider]  vancomycin (VANCOCIN) 5000 MG injection Inject into the vein. 12/21/19   [provider]    Allergies Bupropion, Levaquin [levofloxacin in d5w], Methylprednisolone acetate, Amoxicillin, Codeine, Cymbalta [duloxetine hcl], Gatifloxacin, Guaifenesin & derivatives, Keflex [cephalexin], Klonopin [clonazepam], Macrobid [nitrofurantoin monohyd macro], Penicillins, Sulfa antibiotics, Amitriptyline, and Other  Family History  Problem Relation Age of Onset   Ovarian cancer Mother    Breast cancer Mother    Brain cancer Mother    CAD Father    Diabetes Father    Hepatitis Father    Liver disease Father    Cancer Father    Alcohol abuse Father    Heart attack Brother    Sleep apnea Brother    Diabetes Brother    ALS Paternal Grandmother    Stroke Maternal Grandmother     Social History Social History   Tobacco Use   Smoking status: Every Day    Packs/day: 0.50    Types: Cigarettes   Smokeless tobacco: Never  Vaping Use   Vaping Use: Never used  Substance Use Topics   Alcohol use: Yes    Alcohol/week: 0.0 standard drinks    Comment: rarely   Drug use: No    Review of Systems  Constitutional: No fever/chills Eyes: No visual changes. ENT: No sore throat. Cardiovascular: Denies chest pain.  History of congestive heart failure. Respiratory: Denies shortness of breath. Gastrointestinal: No abdominal pain.  No nausea, no vomiting.  No diarrhea.  No constipation. Genitourinary: Negative for dysuria. Musculoskeletal: Negative for back pain. Skin: Negative for rash. Neurological: Negative for headaches, focal weakness or numbness. Psychiatric: Anxiety and dementia. Endocrine: Hyperlipidemia. Allergic/Immunilogical: See extensive medication allergy list. ____________________________________________   PHYSICAL EXAM:  VITAL SIGNS: ED Triage Vitals  Enc Vitals Group     BP 04/11/21 1147 (!) 143/101     Pulse Rate 04/11/21  1147 (!) 105     Resp 04/11/21 1147 19     Temp 04/11/21 1147 98.9 F (37.2 C)     Temp Source 04/11/21 1147 Oral     SpO2 04/11/21 1147 92 %     Weight 04/11/21 1147 149 lb 14.6 oz (68 kg)     Height 04/11/21 1147 5' 4"  (1.626 m)     Head Circumference --      Peak Flow --      Pain Score 04/11/21 1146 10     Pain Loc --      Pain Edu? --  Excl. in [place]? --     Constitutional: Alert and oriented. Well appearing and in no acute distress. Eyes: Conjunctivae are normal. PERRL. EOMI. Head: Atraumatic. Nose: No congestion/rhinnorhea. Mouth/Throat: Mucous membranes are moist.  Oropharynx non-erythematous. Neck: No stridor.  No cervical spine tenderness to palpation. Hematological/Lymphatic/Immunilogical: No cervical lymphadenopathy. Cardiovascular: Normal rate, regular rhythm. Grossly normal heart sounds.  Good peripheral circulation.  Elevated blood pressure. Respiratory: Normal respiratory effort.  No retractions. Lungs CTAB. Gastrointestinal: Soft and nontender. No distention. No abdominal bruits. No CVA tenderness. Genitourinary: Deferred Musculoskeletal: No lower extremity tenderness nor edema.  No joint effusions. Neurologic:  Normal speech and language. No gross focal neurologic deficits are appreciated. No gait instability. Skin: Multiple superficial bites and scratches of the right forearm. Psychiatric: Mood and affect are normal. Speech and behavior are normal.  ____________________________________________   LABS (all labs ordered are listed, but only abnormal results are displayed)  Labs Reviewed - No data to display ____________________________________________  EKG   ____________________________________________  RADIOLOGY I, Sable Feil, personally viewed and evaluated these images (plain radiographs) as part of my medical decision making, as well as reviewing the written report by the radiologist.  ED MD interpretation:    Official radiology  report(s): No results found.  ____________________________________________   PROCEDURES  Procedure(s) performed (including Critical Care):  Procedures   ____________________________________________   INITIAL IMPRESSION / ASSESSMENT AND PLAN / ED COURSE  As part of my medical decision making, I reviewed the following data within the [place]         Patient presents with superficial cat bite and scratches to the right upper forearm which occurred 2 days ago.  Patient is allergic to amoxicillin and explained the rationale for putting around 2 different antibiotics.  Patient given discharge care instructions.  Patient given Tdap prior to departure.  Patient advised to validate immunization status of her neighbor's cat.  Return right ED if condition worsens.      ____________________________________________   FINAL CLINICAL IMPRESSION(S) / ED DIAGNOSES  Final diagnoses:  Cat bite, initial encounter     ED Discharge Orders          Ordered    doxycycline (MONODOX) 100 MG capsule  2 times daily        04/11/21 1232    metroNIDAZOLE (FLAGYL) 500 MG tablet  3 times daily        04/11/21 1232             Note:  This document was prepared using Dragon voice recognition software and may include unintentional dictation errors.    Sable Feil, PA-C 04/11/21 1238    Lavonia Drafts, MD 04/11/21 [phone]

## 2021-04-11 NOTE — Discharge Instructions (Addendum)
Read and follow discharge care instruction.  Since you are allergic to amoxicillin you must take your antibiotics as directed.

## 2021-04-11 NOTE — ED Triage Notes (Signed)
Pt comes into the ED via POV c/o cat bite and possible scracth to the right forearm.  Pt states this happened on Wednesday of this week.  Pt has been using peroxide to clean it.  Pt in NAD at this time.  Pt states she doesn't know if the cat is up to date on its rabies vaccine.

## 2021-05-12 ENCOUNTER — Emergency Department: Payer: Medicare Other

## 2021-05-12 ENCOUNTER — Other Ambulatory Visit: Payer: Self-pay

## 2021-05-12 DIAGNOSIS — Z880 Allergy status to penicillin: Secondary | ICD-10-CM

## 2021-05-12 DIAGNOSIS — Z85828 Personal history of other malignant neoplasm of skin: Secondary | ICD-10-CM

## 2021-05-12 DIAGNOSIS — R519 Headache, unspecified: Secondary | ICD-10-CM | POA: Diagnosis present

## 2021-05-12 DIAGNOSIS — R1310 Dysphagia, unspecified: Secondary | ICD-10-CM | POA: Diagnosis present

## 2021-05-12 DIAGNOSIS — Z66 Do not resuscitate: Secondary | ICD-10-CM | POA: Diagnosis present

## 2021-05-12 DIAGNOSIS — Z8249 Family history of ischemic heart disease and other diseases of the circulatory system: Secondary | ICD-10-CM

## 2021-05-12 DIAGNOSIS — M4802 Spinal stenosis, cervical region: Secondary | ICD-10-CM | POA: Diagnosis present

## 2021-05-12 DIAGNOSIS — E2740 Unspecified adrenocortical insufficiency: Secondary | ICD-10-CM | POA: Diagnosis present

## 2021-05-12 DIAGNOSIS — M5030 Other cervical disc degeneration, unspecified cervical region: Secondary | ICD-10-CM | POA: Diagnosis present

## 2021-05-12 DIAGNOSIS — F0280 Dementia in other diseases classified elsewhere without behavioral disturbance: Secondary | ICD-10-CM | POA: Diagnosis present

## 2021-05-12 DIAGNOSIS — W010XXA Fall on same level from slipping, tripping and stumbling without subsequent striking against object, initial encounter: Secondary | ICD-10-CM | POA: Diagnosis present

## 2021-05-12 DIAGNOSIS — S2241XA Multiple fractures of ribs, right side, initial encounter for closed fracture: Principal | ICD-10-CM | POA: Diagnosis present

## 2021-05-12 DIAGNOSIS — Z885 Allergy status to narcotic agent status: Secondary | ICD-10-CM

## 2021-05-12 DIAGNOSIS — T380X5A Adverse effect of glucocorticoids and synthetic analogues, initial encounter: Secondary | ICD-10-CM | POA: Diagnosis present

## 2021-05-12 DIAGNOSIS — F17210 Nicotine dependence, cigarettes, uncomplicated: Secondary | ICD-10-CM | POA: Diagnosis present

## 2021-05-12 DIAGNOSIS — S21111A Laceration without foreign body of right front wall of thorax without penetration into thoracic cavity, initial encounter: Secondary | ICD-10-CM | POA: Diagnosis present

## 2021-05-12 DIAGNOSIS — R490 Dysphonia: Secondary | ICD-10-CM | POA: Diagnosis present

## 2021-05-12 DIAGNOSIS — E785 Hyperlipidemia, unspecified: Secondary | ICD-10-CM | POA: Diagnosis present

## 2021-05-12 DIAGNOSIS — T1490XA Injury, unspecified, initial encounter: Secondary | ICD-10-CM | POA: Diagnosis not present

## 2021-05-12 DIAGNOSIS — Z9049 Acquired absence of other specified parts of digestive tract: Secondary | ICD-10-CM

## 2021-05-12 DIAGNOSIS — E249 Cushing's syndrome, unspecified: Secondary | ICD-10-CM | POA: Diagnosis present

## 2021-05-12 DIAGNOSIS — J309 Allergic rhinitis, unspecified: Secondary | ICD-10-CM | POA: Diagnosis present

## 2021-05-12 DIAGNOSIS — Z888 Allergy status to other drugs, medicaments and biological substances status: Secondary | ICD-10-CM

## 2021-05-12 DIAGNOSIS — Z79899 Other long term (current) drug therapy: Secondary | ICD-10-CM

## 2021-05-12 DIAGNOSIS — G300 Alzheimer's disease with early onset: Secondary | ICD-10-CM | POA: Diagnosis present

## 2021-05-12 DIAGNOSIS — M50121 Cervical disc disorder at C4-C5 level with radiculopathy: Secondary | ICD-10-CM | POA: Diagnosis present

## 2021-05-12 DIAGNOSIS — Z96642 Presence of left artificial hip joint: Secondary | ICD-10-CM | POA: Diagnosis present

## 2021-05-12 DIAGNOSIS — Z881 Allergy status to other antibiotic agents status: Secondary | ICD-10-CM

## 2021-05-12 DIAGNOSIS — Z20822 Contact with and (suspected) exposure to covid-19: Secondary | ICD-10-CM | POA: Diagnosis present

## 2021-05-12 DIAGNOSIS — E876 Hypokalemia: Secondary | ICD-10-CM | POA: Diagnosis present

## 2021-05-12 DIAGNOSIS — J449 Chronic obstructive pulmonary disease, unspecified: Secondary | ICD-10-CM | POA: Diagnosis present

## 2021-05-12 DIAGNOSIS — I5032 Chronic diastolic (congestive) heart failure: Secondary | ICD-10-CM | POA: Diagnosis present

## 2021-05-12 DIAGNOSIS — K219 Gastro-esophageal reflux disease without esophagitis: Secondary | ICD-10-CM | POA: Diagnosis present

## 2021-05-12 DIAGNOSIS — E1165 Type 2 diabetes mellitus with hyperglycemia: Secondary | ICD-10-CM | POA: Diagnosis present

## 2021-05-12 DIAGNOSIS — Z882 Allergy status to sulfonamides status: Secondary | ICD-10-CM

## 2021-05-12 DIAGNOSIS — Z90710 Acquired absence of both cervix and uterus: Secondary | ICD-10-CM

## 2021-05-12 DIAGNOSIS — M25551 Pain in right hip: Secondary | ICD-10-CM | POA: Diagnosis present

## 2021-05-12 DIAGNOSIS — G8929 Other chronic pain: Secondary | ICD-10-CM | POA: Diagnosis present

## 2021-05-12 DIAGNOSIS — Z7952 Long term (current) use of systemic steroids: Secondary | ICD-10-CM

## 2021-05-12 DIAGNOSIS — I2510 Atherosclerotic heart disease of native coronary artery without angina pectoris: Secondary | ICD-10-CM | POA: Diagnosis present

## 2021-05-12 LAB — COMPREHENSIVE METABOLIC PANEL
ALT: 31 U/L (ref 0–44)
AST: 23 U/L (ref 15–41)
Albumin: 4.1 g/dL (ref 3.5–5.0)
Alkaline Phosphatase: 79 U/L (ref 38–126)
Anion gap: 14 (ref 5–15)
BUN: 27 mg/dL — ABNORMAL HIGH (ref 6–20)
CO2: 32 mmol/L (ref 22–32)
Calcium: 9.1 mg/dL (ref 8.9–10.3)
Chloride: 88 mmol/L — ABNORMAL LOW (ref 98–111)
Creatinine, Ser: 1.08 mg/dL — ABNORMAL HIGH (ref 0.44–1.00)
GFR, Estimated: 59 mL/min — ABNORMAL LOW (ref >60)
Glucose, Bld: 106 mg/dL — ABNORMAL HIGH (ref 70–99)
Potassium: 3.3 mmol/L — ABNORMAL LOW (ref 3.5–5.1)
Sodium: 134 mmol/L — ABNORMAL LOW (ref 135–145)
Total Bilirubin: 0.8 mg/dL (ref 0.3–1.2)
Total Protein: 7.5 g/dL (ref 6.5–8.1)

## 2021-05-12 LAB — TROPONIN I (HIGH SENSITIVITY): Troponin I (High Sensitivity): 7 ng/L (ref <18)

## 2021-05-12 LAB — CBC WITH DIFFERENTIAL/PLATELET
Abs Immature Granulocytes: 0.07 10*3/uL (ref 0.00–0.07)
Basophils Absolute: 0.1 10*3/uL (ref 0.0–0.1)
Basophils Relative: 1 %
Eosinophils Absolute: 0.2 10*3/uL (ref 0.0–0.5)
Eosinophils Relative: 2 %
HCT: 39 % (ref 36.0–46.0)
Hemoglobin: 13.4 g/dL (ref 12.0–15.0)
Immature Granulocytes: 1 %
Lymphocytes Relative: 38 %
Lymphs Abs: 3.9 10*3/uL (ref 0.7–4.0)
MCH: 31.5 pg (ref 26.0–34.0)
MCHC: 34.4 g/dL (ref 30.0–36.0)
MCV: 91.5 fL (ref 80.0–100.0)
Monocytes Absolute: 0.8 10*3/uL (ref 0.1–1.0)
Monocytes Relative: 8 %
Neutro Abs: 5.2 10*3/uL (ref 1.7–7.7)
Neutrophils Relative %: 50 %
Platelets: 310 10*3/uL (ref 150–400)
RBC: 4.26 MIL/uL (ref 3.87–5.11)
RDW: 14.2 % (ref 11.5–15.5)
WBC: 10.2 10*3/uL (ref 4.0–10.5)
nRBC: 0 % (ref 0.0–0.2)

## 2021-05-12 MED ORDER — IOHEXOL 300 MG/ML  SOLN
100.0000 mL | Freq: Once | INTRAMUSCULAR | Status: AC | PRN
  Administered 2021-05-12: 100 mL via INTRAVENOUS

## 2021-05-12 NOTE — ED Provider Notes (Signed)
Emergency Medicine Provider Triage Evaluation Note  [name] , a 60 y.o. female  was evaluated in triage.  Pt complains of multiple pain complaints after what appears to be a mechanical fall.  Patient fell 2 days ago, fell, knocked over a lamp in the process and landed on the globe.  The glass globe ruptured causing a deep laceration along her chest wall.  Patient states that she did hit her head but does not believe she lost consciousness.  She endorses pain to the scalp, headache, right-sided neck pain, pain over the laceration.  She denies any abdominal pain but does endorse right hip pain.  Patient denies subsequent loss of consciousness..  Review of Systems  Positive: Fall, deep laceration along the chest wall, multiple pain complaints Negative: Loss of consciousness, abdominal pain  Physical Exam  BP 126/85 (BP Location: Right Arm)   Pulse (!) 106   Temp 98.2 F (36.8 C) (Oral)   Resp (!) 22   Ht 5\' 8"  (1.727 m)   Wt 68 kg   SpO2 92%   BMI 22.81 kg/m  Gen:   Awake, no distress   Resp:  Normal effort.  No absent or decreased breath sounds MSK:   Moves extremities without difficulty.  Patient has a deep laceration along her ribs.  No bubbling.  Patient is exquisitely tender to palpation.  I cannot assess the bottom of the laceration at this time. Other:  Multiple areas of contusion on the right side consistent with fall  Medical Decision Making  Medically screening exam initiated at 8:00 PM.  Appropriate orders placed.  SUSI GOSLIN was informed that the remainder of the evaluation will be completed by another provider, this initial triage assessment does not replace that evaluation, and the importance of remaining in the ED until their evaluation is complete.  Patient presents emergency department after falling on a glass lamp.  The broken glass lacerated deeply along her chest wall.  There is no bubbling at this time.  No absent breath sounds.  However patient is endorsing some  shortness of breath.  I will image the patient with CT head and neck, CT scan of the chest and x-ray of the hip.  Patient will have accompanying labs   [name] 05/12/21 Margarito Liner, MD 05/12/21 2256

## 2021-05-12 NOTE — ED Triage Notes (Addendum)
Pt fell 2 days states tripped over a lamp and fell onto it. States lamp broke and piece of glass lacerated her right rib area. Large lac noted to same, with right hip bruising, also co head and neck pain. Pt unsure of loc.

## 2021-05-13 ENCOUNTER — Inpatient Hospital Stay
Admission: EM | Admit: 2021-05-13 | Discharge: 2021-05-16 | DRG: 988 | Disposition: A | Discharge status: Home Health | Payer: Medicare Other | Attending: Hospitalist | Admitting: Hospitalist

## 2021-05-13 ENCOUNTER — Encounter: Payer: Self-pay | Admitting: Internal Medicine

## 2021-05-13 DIAGNOSIS — E2740 Unspecified adrenocortical insufficiency: Secondary | ICD-10-CM | POA: Diagnosis present

## 2021-05-13 DIAGNOSIS — M25551 Pain in right hip: Secondary | ICD-10-CM | POA: Diagnosis present

## 2021-05-13 DIAGNOSIS — I5032 Chronic diastolic (congestive) heart failure: Secondary | ICD-10-CM | POA: Diagnosis present

## 2021-05-13 DIAGNOSIS — S2241XB Multiple fractures of ribs, right side, initial encounter for open fracture: Secondary | ICD-10-CM | POA: Diagnosis not present

## 2021-05-13 DIAGNOSIS — K219 Gastro-esophageal reflux disease without esophagitis: Secondary | ICD-10-CM | POA: Diagnosis present

## 2021-05-13 DIAGNOSIS — W19XXXA Unspecified fall, initial encounter: Secondary | ICD-10-CM

## 2021-05-13 DIAGNOSIS — M4802 Spinal stenosis, cervical region: Secondary | ICD-10-CM | POA: Diagnosis present

## 2021-05-13 DIAGNOSIS — S21111A Laceration without foreign body of right front wall of thorax without penetration into thoracic cavity, initial encounter: Secondary | ICD-10-CM | POA: Diagnosis present

## 2021-05-13 DIAGNOSIS — Y92009 Unspecified place in unspecified non-institutional (private) residence as the place of occurrence of the external cause: Secondary | ICD-10-CM

## 2021-05-13 DIAGNOSIS — F0390 Unspecified dementia without behavioral disturbance: Secondary | ICD-10-CM | POA: Diagnosis not present

## 2021-05-13 DIAGNOSIS — Z20822 Contact with and (suspected) exposure to covid-19: Secondary | ICD-10-CM | POA: Diagnosis present

## 2021-05-13 DIAGNOSIS — Z720 Tobacco use: Secondary | ICD-10-CM | POA: Diagnosis not present

## 2021-05-13 DIAGNOSIS — F0280 Dementia in other diseases classified elsewhere without behavioral disturbance: Secondary | ICD-10-CM | POA: Diagnosis present

## 2021-05-13 DIAGNOSIS — T1490XA Injury, unspecified, initial encounter: Secondary | ICD-10-CM

## 2021-05-13 DIAGNOSIS — G8929 Other chronic pain: Secondary | ICD-10-CM | POA: Diagnosis present

## 2021-05-13 DIAGNOSIS — M50121 Cervical disc disorder at C4-C5 level with radiculopathy: Secondary | ICD-10-CM | POA: Diagnosis present

## 2021-05-13 DIAGNOSIS — E785 Hyperlipidemia, unspecified: Secondary | ICD-10-CM | POA: Diagnosis present

## 2021-05-13 DIAGNOSIS — I2510 Atherosclerotic heart disease of native coronary artery without angina pectoris: Secondary | ICD-10-CM | POA: Insufficient documentation

## 2021-05-13 DIAGNOSIS — E876 Hypokalemia: Secondary | ICD-10-CM | POA: Diagnosis present

## 2021-05-13 DIAGNOSIS — J449 Chronic obstructive pulmonary disease, unspecified: Secondary | ICD-10-CM | POA: Diagnosis present

## 2021-05-13 DIAGNOSIS — E249 Cushing's syndrome, unspecified: Secondary | ICD-10-CM | POA: Diagnosis present

## 2021-05-13 DIAGNOSIS — R519 Headache, unspecified: Secondary | ICD-10-CM | POA: Diagnosis present

## 2021-05-13 DIAGNOSIS — E119 Type 2 diabetes mellitus without complications: Secondary | ICD-10-CM

## 2021-05-13 DIAGNOSIS — F418 Other specified anxiety disorders: Secondary | ICD-10-CM | POA: Diagnosis present

## 2021-05-13 DIAGNOSIS — G300 Alzheimer's disease with early onset: Secondary | ICD-10-CM | POA: Diagnosis present

## 2021-05-13 DIAGNOSIS — Z66 Do not resuscitate: Secondary | ICD-10-CM | POA: Diagnosis present

## 2021-05-13 DIAGNOSIS — J309 Allergic rhinitis, unspecified: Secondary | ICD-10-CM | POA: Diagnosis present

## 2021-05-13 DIAGNOSIS — T380X5A Adverse effect of glucocorticoids and synthetic analogues, initial encounter: Secondary | ICD-10-CM | POA: Diagnosis present

## 2021-05-13 DIAGNOSIS — F17210 Nicotine dependence, cigarettes, uncomplicated: Secondary | ICD-10-CM | POA: Diagnosis present

## 2021-05-13 DIAGNOSIS — W010XXA Fall on same level from slipping, tripping and stumbling without subsequent striking against object, initial encounter: Secondary | ICD-10-CM | POA: Diagnosis present

## 2021-05-13 DIAGNOSIS — E1165 Type 2 diabetes mellitus with hyperglycemia: Secondary | ICD-10-CM | POA: Diagnosis present

## 2021-05-13 DIAGNOSIS — S2231XB Fracture of one rib, right side, initial encounter for open fracture: Secondary | ICD-10-CM | POA: Diagnosis present

## 2021-05-13 DIAGNOSIS — Z96642 Presence of left artificial hip joint: Secondary | ICD-10-CM | POA: Diagnosis present

## 2021-05-13 DIAGNOSIS — S2241XA Multiple fractures of ribs, right side, initial encounter for closed fracture: Secondary | ICD-10-CM | POA: Diagnosis present

## 2021-05-13 LAB — CBG MONITORING, ED
Glucose-Capillary: 134 mg/dL — ABNORMAL HIGH (ref 70–99)
Glucose-Capillary: 185 mg/dL — ABNORMAL HIGH (ref 70–99)
Glucose-Capillary: 101 mg/dL — ABNORMAL HIGH (ref 70–99)

## 2021-05-13 LAB — RESP PANEL BY RT-PCR (FLU A&B, COVID) ARPGX2
Influenza A by PCR: NEGATIVE
Influenza B by PCR: NEGATIVE
SARS Coronavirus 2 by RT PCR: NEGATIVE

## 2021-05-13 LAB — PROTIME-INR
INR: 0.9 (ref 0.8–1.2)
Prothrombin Time: 12.3 seconds (ref 11.4–15.2)

## 2021-05-13 LAB — GLUCOSE, CAPILLARY: Glucose-Capillary: 235 mg/dL — ABNORMAL HIGH (ref 70–99)

## 2021-05-13 LAB — MAGNESIUM: Magnesium: 1.8 mg/dL (ref 1.7–2.4)

## 2021-05-13 LAB — BRAIN NATRIURETIC PEPTIDE: B Natriuretic Peptide: 22.9 pg/mL (ref 0.0–100.0)

## 2021-05-13 LAB — TROPONIN I (HIGH SENSITIVITY): Troponin I (High Sensitivity): 9 ng/L (ref <18)

## 2021-05-13 MED ORDER — CYCLOBENZAPRINE HCL 10 MG PO TABS
10.0000 mg | ORAL_TABLET | Freq: Three times a day (TID) | ORAL | Status: DC | PRN
  Administered 2021-05-13 – 2021-05-16 (×5): 10 mg via ORAL
  Filled 2021-05-13 (×6): qty 1

## 2021-05-13 MED ORDER — BUPROPION HCL ER (SR) 100 MG PO TB12
100.0000 mg | ORAL_TABLET | Freq: Two times a day (BID) | ORAL | Status: DC
  Administered 2021-05-14: 100 mg via ORAL
  Filled 2021-05-13 (×3): qty 1

## 2021-05-13 MED ORDER — TRAZODONE HCL 100 MG PO TABS
100.0000 mg | ORAL_TABLET | Freq: Every day | ORAL | Status: DC
  Administered 2021-05-14 – 2021-05-15 (×2): 100 mg via ORAL
  Filled 2021-05-13 (×2): qty 1

## 2021-05-13 MED ORDER — OYSTER SHELL CALCIUM/D3 500-5 MG-MCG PO TABS
1.0000 | ORAL_TABLET | Freq: Every day | ORAL | Status: DC
  Administered 2021-05-14 – 2021-05-16 (×3): 1 via ORAL
  Filled 2021-05-13 (×3): qty 1

## 2021-05-13 MED ORDER — DONEPEZIL HCL 5 MG PO TABS: 10.0000 mg | ORAL_TABLET | Freq: Every day | ORAL | Status: DC

## 2021-05-13 MED ORDER — MECLIZINE HCL 25 MG PO TABS
25.0000 mg | ORAL_TABLET | Freq: Three times a day (TID) | ORAL | Status: DC | PRN
  Filled 2021-05-13: qty 1

## 2021-05-13 MED ORDER — FUROSEMIDE 40 MG PO TABS
80.0000 mg | ORAL_TABLET | Freq: Every day | ORAL | Status: DC
  Administered 2021-05-14 – 2021-05-16 (×3): 80 mg via ORAL
  Filled 2021-05-13 (×3): qty 2

## 2021-05-13 MED ORDER — DIPHENHYDRAMINE HCL 50 MG/ML IJ SOLN: 12.5000 mg | Freq: Three times a day (TID) | INTRAMUSCULAR | Status: DC | PRN

## 2021-05-13 MED ORDER — OXYCODONE-ACETAMINOPHEN 5-325 MG PO TABS
1.0000 | ORAL_TABLET | ORAL | Status: DC | PRN
  Administered 2021-05-13 – 2021-05-14 (×3): 1 via ORAL
  Filled 2021-05-13 (×4): qty 1

## 2021-05-13 MED ORDER — POTASSIUM CHLORIDE CRYS ER 20 MEQ PO TBCR
40.0000 meq | EXTENDED_RELEASE_TABLET | Freq: Once | ORAL | Status: AC
  Administered 2021-05-13: 40 meq via ORAL
  Filled 2021-05-13: qty 2

## 2021-05-13 MED ORDER — PREDNISONE 50 MG PO TABS
50.0000 mg | ORAL_TABLET | Freq: Four times a day (QID) | ORAL | Status: DC
  Administered 2021-05-13 – 2021-05-14 (×6): 50 mg via ORAL
  Filled 2021-05-13 (×2): qty 1
  Filled 2021-05-13: qty 3
  Filled 2021-05-13: qty 1
  Filled 2021-05-13: qty 3
  Filled 2021-05-13: qty 1

## 2021-05-13 MED ORDER — ACETAMINOPHEN 500 MG PO TABS
1000.0000 mg | ORAL_TABLET | Freq: Once | ORAL | Status: AC
  Administered 2021-05-13: 1000 mg via ORAL
  Filled 2021-05-13: qty 2

## 2021-05-13 MED ORDER — FLUTICASONE PROPIONATE 50 MCG/ACT NA SUSP
2.0000 | Freq: Every day | NASAL | Status: DC
  Administered 2021-05-14 – 2021-05-16 (×3): 2 via NASAL
  Filled 2021-05-13: qty 16

## 2021-05-13 MED ORDER — PANTOPRAZOLE SODIUM 40 MG PO TBEC
40.0000 mg | DELAYED_RELEASE_TABLET | Freq: Every day | ORAL | Status: DC
  Administered 2021-05-14 – 2021-05-16 (×3): 40 mg via ORAL
  Filled 2021-05-13 (×3): qty 1

## 2021-05-13 MED ORDER — INSULIN ASPART 100 UNIT/ML IJ SOLN
0.0000 [IU] | Freq: Every day | INTRAMUSCULAR | Status: DC
  Administered 2021-05-13: 2 [IU] via SUBCUTANEOUS
  Filled 2021-05-13: qty 1

## 2021-05-13 MED ORDER — GABAPENTIN 600 MG PO TABS
600.0000 mg | ORAL_TABLET | Freq: Three times a day (TID) | ORAL | Status: DC
  Administered 2021-05-14 – 2021-05-16 (×7): 600 mg via ORAL
  Filled 2021-05-13 (×7): qty 1

## 2021-05-13 MED ORDER — MAGNESIUM OXIDE 400 MG PO TABS
400.0000 mg | ORAL_TABLET | Freq: Every day | ORAL | Status: DC
  Administered 2021-05-14 – 2021-05-16 (×3): 400 mg via ORAL
  Filled 2021-05-13 (×5): qty 1

## 2021-05-13 MED ORDER — ALPRAZOLAM 0.5 MG PO TABS
1.0000 mg | ORAL_TABLET | Freq: Four times a day (QID) | ORAL | Status: DC
  Administered 2021-05-14: 1 mg via ORAL
  Filled 2021-05-13: qty 2

## 2021-05-13 MED ORDER — ONDANSETRON HCL 4 MG/2ML IJ SOLN
4.0000 mg | Freq: Three times a day (TID) | INTRAMUSCULAR | Status: DC | PRN
  Administered 2021-05-13 – 2021-05-16 (×7): 4 mg via INTRAVENOUS
  Filled 2021-05-13 (×6): qty 2

## 2021-05-13 MED ORDER — ACETAMINOPHEN 325 MG PO TABS: 650.0000 mg | ORAL_TABLET | Freq: Four times a day (QID) | ORAL | Status: DC | PRN

## 2021-05-13 MED ORDER — MOMETASONE FURO-FORMOTEROL FUM 200-5 MCG/ACT IN AERO
2.0000 | INHALATION_SPRAY | Freq: Two times a day (BID) | RESPIRATORY_TRACT | Status: DC
  Administered 2021-05-14 – 2021-05-16 (×5): 2 via RESPIRATORY_TRACT
  Filled 2021-05-13: qty 8.8

## 2021-05-13 MED ORDER — CEFAZOLIN SODIUM-DEXTROSE 2-4 GM/100ML-% IV SOLN
2.0000 g | Freq: Three times a day (TID) | INTRAVENOUS | Status: DC
  Administered 2021-05-13 – 2021-05-16 (×9): 2 g via INTRAVENOUS
  Filled 2021-05-13 (×11): qty 100

## 2021-05-13 MED ORDER — BENZONATATE 100 MG PO CAPS: 100.0000 mg | ORAL_CAPSULE | ORAL | Status: DC | PRN

## 2021-05-13 MED ORDER — INSULIN ASPART 100 UNIT/ML IJ SOLN
0.0000 [IU] | Freq: Three times a day (TID) | INTRAMUSCULAR | Status: DC
  Administered 2021-05-13: 2 [IU] via SUBCUTANEOUS
  Administered 2021-05-14: 3 [IU] via SUBCUTANEOUS
  Administered 2021-05-14: 2 [IU] via SUBCUTANEOUS
  Administered 2021-05-14: 3 [IU] via SUBCUTANEOUS
  Administered 2021-05-15 (×3): 2 [IU] via SUBCUTANEOUS
  Filled 2021-05-13 (×7): qty 1

## 2021-05-13 MED ORDER — LIDOCAINE HCL (PF) 1 % IJ SOLN
10.0000 mL | Freq: Once | INTRAMUSCULAR | Status: AC
  Administered 2021-05-13: 10 mL
  Filled 2021-05-13: qty 10

## 2021-05-13 MED ORDER — FENTANYL CITRATE PF 50 MCG/ML IJ SOSY
50.0000 ug | PREFILLED_SYRINGE | Freq: Once | INTRAMUSCULAR | Status: AC
Start: 2021-05-13 — End: 2021-05-13
  Administered 2021-05-13: 50 ug via INTRAVENOUS
  Filled 2021-05-13: qty 1

## 2021-05-13 MED ORDER — CEFAZOLIN SODIUM-DEXTROSE 2-4 GM/100ML-% IV SOLN
2.0000 g | Freq: Once | INTRAVENOUS | Status: AC
  Administered 2021-05-13: 2 g via INTRAVENOUS
  Filled 2021-05-13: qty 100

## 2021-05-13 MED ORDER — MORPHINE SULFATE (PF) 2 MG/ML IV SOLN
2.0000 mg | INTRAVENOUS | Status: DC | PRN
  Administered 2021-05-13 – 2021-05-14 (×5): 2 mg via INTRAVENOUS
  Filled 2021-05-13 (×5): qty 1

## 2021-05-13 MED ORDER — DIPHENHYDRAMINE HCL 25 MG PO CAPS: 25.0000 mg | ORAL_CAPSULE | Freq: Three times a day (TID) | ORAL | Status: DC | PRN

## 2021-05-13 MED ORDER — CEFAZOLIN SODIUM-DEXTROSE 2-4 GM/100ML-% IV SOLN
2.0000 g | Freq: Three times a day (TID) | INTRAVENOUS | Status: DC
  Filled 2021-05-13 (×3): qty 100

## 2021-05-13 MED ORDER — ALBUTEROL SULFATE (2.5 MG/3ML) 0.083% IN NEBU: 2.5000 mg | INHALATION_SOLUTION | RESPIRATORY_TRACT | Status: DC | PRN

## 2021-05-13 MED ORDER — MAGNESIUM OXIDE 400 MG PO TABS
400.0000 mg | ORAL_TABLET | Freq: Every day | ORAL | Status: DC
  Filled 2021-05-13 (×2): qty 1

## 2021-05-13 MED ORDER — DONEPEZIL HCL 5 MG PO TABS
10.0000 mg | ORAL_TABLET | Freq: Every day | ORAL | Status: DC
  Administered 2021-05-14 – 2021-05-15 (×2): 10 mg via ORAL
  Filled 2021-05-13 (×3): qty 2

## 2021-05-13 MED ORDER — ALPRAZOLAM 0.5 MG PO TABS: 1.0000 mg | ORAL_TABLET | Freq: Four times a day (QID) | ORAL | Status: DC

## 2021-05-13 MED ORDER — GABAPENTIN 600 MG PO TABS: 600.0000 mg | ORAL_TABLET | Freq: Three times a day (TID) | ORAL | Status: DC

## 2021-05-13 MED ORDER — HYDROXYZINE HCL 25 MG PO TABS
25.0000 mg | ORAL_TABLET | Freq: Three times a day (TID) | ORAL | Status: DC | PRN
  Filled 2021-05-13: qty 1

## 2021-05-13 MED ORDER — MORPHINE SULFATE (PF) 4 MG/ML IV SOLN
4.0000 mg | Freq: Once | INTRAVENOUS | Status: AC
Start: 2021-05-13 — End: 2021-05-13
  Administered 2021-05-13: 4 mg via INTRAVENOUS
  Filled 2021-05-13: qty 1

## 2021-05-13 MED ORDER — NICOTINE 21 MG/24HR TD PT24
21.0000 mg | MEDICATED_PATCH | Freq: Every day | TRANSDERMAL | Status: DC
  Filled 2021-05-13 (×2): qty 1

## 2021-05-13 NOTE — ED Provider Notes (Signed)
[facility] Emergency Department Provider Note  ____________________________________________  Time seen: Approximately 5:14 AM  I have reviewed the triage vital signs and the nursing notes.   HISTORY  Chief Complaint Fall   HPI [name] is a 60 y.o. female with history as listed below who presents for evaluation after mechanical fall.  Patient reports that 2 days ago she tripped and fell over a lamp.  The lamp broke and she sustained a large deep laceration to the right lateral chest wall.  She is complaining of pain in her chest wall, pain in her right hip, headache and neck pain.  She has a history of chronic neck pain for which she is on oxycodone at home.  She reports that the fall was mechanical in nature.  She has been dressing and sterilizing the wound at home.  Last tetanus shot was this year.  She is complaining of severe constant pain on the right side of her body.  She also reports feeling short of breath because it hurts to take a deep breath.  No back pain.  She hit her head but denies LOC.  She is not on blood thinners  Past Medical History:  Diagnosis Date   Adrenal insufficiency (HCC)    Anxiety    CAD (coronary artery disease)    Cancer (HCC)    skin   Chronic diastolic CHF (congestive heart failure) (HCC)    Chronic pain    COPD (chronic obstructive pulmonary disease) ([place])    Cushing disease ([place])    Dementia ([place])    GERD (gastroesophageal reflux disease)    HLD (hyperlipidemia)     Patient Active Problem List   Diagnosis Date Noted   Fall 05/13/2021   Hypokalemia 05/13/2021   CAD (coronary artery disease)    Fall at home, initial encounter    Laceration of right chest wall    Open fracture of rib of right side_7th and 8th rib    Depression with anxiety    Dislocation of internal left hip prosthesis ([place]) 12/06/2019   Preoperative clearance 12/06/2019   Accidental fall 12/06/2019   Scalp laceration, initial encounter  12/06/2019   Type 2 diabetes mellitus without complication ([place]) 63/84/6659   Dementia without behavioral disturbance ([place]) 12/06/2019   S/P hip replacement, left 12/06/2019   Injury of conjunctiva and corneal abrasion of right eye without foreign body 11/19/2019   Intractable nausea and vomiting 09/12/2019   Vertigo 09/12/2019   Chronic diastolic CHF (congestive heart failure) ([place]) 09/12/2019   Current chronic use of systemic steroids 09/12/2019   AKI (acute kidney injury) ([place]) 09/12/2019   Hypokalemia due to excessive gastrointestinal loss of potassium 09/12/2019   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 03/28/2019   Cervical disc disorder at C4-C5 level with radiculopathy 03/28/2019   Hallucinations, visual 10/19/2018   Tobacco abuse 03/08/2018   Iron deficiency 11/17/2017   Abdominal pain 09/07/2017   Diarrhea    Secondary adrenal insufficiency ([place]) 07/26/2017   Flexor tenosynovitis of finger 05/15/2017   Sepsis ([place]) 05/15/2017   Early onset Alzheimer's dementia without behavioral disturbance ([place]) 02/16/2017   Low back pain 02/09/2017   Radiculopathy of lumbosacral region 01/07/2017   Neuropathy 11/16/2016   Disturbance of sleep 10/28/2016   Pharyngeal dysphagia 09/24/2016   Ulcer of left lower extremity with fat layer exposed ([place]) 09/24/2016   Venous insufficiency 09/24/2016   Sensorineural hearing loss (SNHL) of both ears 08/20/2016   Sinusitis chronic, ethmoidal 08/20/2016  At high risk for falls 07/21/2016   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   History of fall within past 90 days 07/21/2016   Osteopenia 07/21/2016   Polyneuropathy, idiopathic progressive 07/21/2016   Sleep walking 07/21/2016   Malignant melanoma of lower leg, left ([place]) 11/05/2015   Chest pain 06/17/2015   Elevated troponin 06/17/2015   Fracture, ribs 06/17/2015   GERD (gastroesophageal reflux disease) 06/17/2015   COPD (chronic obstructive pulmonary disease) ([place]) 06/17/2015    Chronic pain 06/17/2015   Anxiety 06/17/2015   Adrenal insufficiency ([place]) 06/17/2015   Allergic rhinitis due to allergen 10/02/2011    Past Surgical History:  Procedure Laterality Date   APPENDECTOMY     HIP CLOSED REDUCTION Left 12/06/2019   Procedure: CLOSED MANIPULATION HIP;  Surgeon: Lovell Sheehan, MD;  Location: [facility] ORS;  Service: Orthopedics;  Laterality: Left;   NASAL SEPTUM SURGERY     NASAL SINUS SURGERY     UMBILICAL HERNIA REPAIR     VAGINAL HYSTERECTOMY      Prior to Admission medications   Medication Sig Start Date End Date Taking? Authorizing Provider  albuterol (VENTOLIN HFA) 108 (90 Base) MCG/ACT inhaler Inhale 2 puffs into the lungs every 6 (six) hours as needed. 06/04/15  Yes [provider]  ALPRAZolam Duanne Moron) 1 MG tablet Take 1 mg by mouth 4 (four) times daily.    Yes [provider]  benzonatate (TESSALON) 200 MG capsule Take by mouth. 03/01/20  Yes [provider]  buPROPion (WELLBUTRIN SR) 100 MG 12 hr tablet Take 100 mg by mouth 2 (two) times daily. 08/16/19  Yes [provider]  cyclobenzaprine (FLEXERIL) 10 MG tablet Take 10 mg by mouth 3 (three) times daily as needed.    Yes [provider]  donepezil (ARICEPT) 10 MG tablet Take 10 mg by mouth at bedtime.   Yes [provider]  EPINEPHrine 0.3 mg/0.3 mL IJ SOAJ injection Inject 0.3 mg into the muscle once. 04/01/18  Yes [provider]  fluticasone (FLONASE) 50 MCG/ACT nasal spray Place 2 sprays into the nose daily. 01/02/13  Yes [provider]  furosemide (LASIX) 80 MG tablet Take by mouth. 03/01/20  Yes [provider]  gabapentin (NEURONTIN) 600 MG tablet Take 1 tablet by mouth 3 (three) times daily.   Yes [provider]  glipiZIDE (GLUCOTROL) 5 MG tablet Take 5 mg by mouth daily. 08/16/19  Yes [provider]  hydrOXYzine (ATARAX/VISTARIL) 25 MG tablet Take 25 mg by mouth 3 (three) times daily as needed for  itching. 03/23/18  Yes [provider]  magnesium oxide (MAG-OX) 400 MG tablet Take 400 mg by mouth daily.   Yes [provider]  meclizine (ANTIVERT) 25 MG tablet Take 1 tablet (25 mg total) by mouth 3 (three) times daily as needed for dizziness. 09/14/19  Yes Danford, Suann Larry, MD  NARCAN 4 MG/0.1ML LIQD nasal spray kit SMARTSIG:Both Nares 03/01/20  Yes [provider]  ondansetron (ZOFRAN-ODT) 4 MG disintegrating tablet Take 1 tablet (4 mg total) by mouth every 8 (eight) hours as needed for nausea or vomiting. 09/14/19  Yes Danford, Suann Larry, MD  oxyCODONE-acetaminophen (PERCOCET) 10-325 MG tablet Take 1 tablet by mouth every 4 (four) hours as needed for pain (pt has to take [place] brand).  10/05/12  Yes [provider]  potassium chloride SA (K-DUR,KLOR-CON) 20 MEQ tablet Take 1 tablet (20 mEq total) by mouth 2 (two) times daily. 09/09/17  Yes Epifanio Lesches, MD  predniSONE (  DELTASONE) 5 MG tablet Take 15 mg by mouth daily with breakfast.   Yes [provider]  PREMARIN vaginal cream Place vaginally. 03/01/20  Yes [provider]  promethazine (PHENERGAN) 25 MG tablet Take 1 tablet (25 mg total) by mouth every 8 (eight) hours as needed for nausea or vomiting. 09/14/19  Yes Danford, Suann Larry, MD  traZODone (DESYREL) 100 MG tablet Take 100 mg by mouth at bedtime. 02/12/21  Yes [provider]  Calcium Carbonate-Vitamin D 600-400 MG-UNIT tablet Take 1 tablet by mouth daily. 05/22/15 09/07/17  [provider]  doxepin (SINEQUAN) 25 MG capsule Take by mouth. Patient not taking: No sig reported 03/01/20   [provider]  doxepin (SINEQUAN) 50 MG capsule Take by mouth. Patient not taking: No sig reported 03/10/18   [provider]  doxycycline (MONODOX) 100 MG capsule Take 100 mg by mouth 2 (two) times daily. Patient not taking: No sig reported 09/25/19   [provider]  doxycycline  (MONODOX) 100 MG capsule Take 1 capsule (100 mg total) by mouth 2 (two) times daily. Patient not taking: No sig reported 04/11/21   Sable Feil, PA-C  Fluticasone-Salmeterol (ADVAIR DISKUS) 250-50 MCG/DOSE AEPB Inhale 1 puff into the lungs every 12 (twelve) hours. 06/04/15 09/07/17  [provider]  memantine (NAMENDA) 5 MG tablet Take by mouth. Patient not taking: No sig reported 03/01/20   [provider]  omeprazole (PRILOSEC) 40 MG capsule Take 1 capsule by mouth 2 (two) times daily. 02/19/15 09/07/17  [provider]    Allergies Bupropion, Levaquin [levofloxacin in d5w], Methylprednisolone acetate, Amoxicillin, Codeine, Cymbalta [duloxetine hcl], Gatifloxacin, Guaifenesin & derivatives, Keflex [cephalexin], Klonopin [clonazepam], Macrobid [nitrofurantoin monohyd macro], Penicillins, Sulfa antibiotics, Amitriptyline, and Other  Family History  Problem Relation Age of Onset   Ovarian cancer Mother    Breast cancer Mother    Brain cancer Mother    CAD Father    Diabetes Father    Hepatitis Father    Liver disease Father    Cancer Father    Alcohol abuse Father    Heart attack Brother    Sleep apnea Brother    Diabetes Brother    ALS Paternal Grandmother    Stroke Maternal Grandmother     Social History Social History   Tobacco Use   Smoking status: Every Day    Packs/day: 0.50    Types: Cigarettes   Smokeless tobacco: Never  Vaping Use   Vaping Use: Never used  Substance Use Topics   Alcohol use: Yes    Alcohol/week: 0.0 standard drinks    Comment: rarely   Drug use: No    Review of Systems  Constitutional: Negative for fever. Eyes: Negative for visual changes. ENT: Negative for facial injury or neck injury Cardiovascular: Negative for chest injury. Respiratory: + shortness of breath and chest wall pain Gastrointestinal: Negative for abdominal pain or injury. Genitourinary: Negative for dysuria. Musculoskeletal: Negative for back  injury, + R hip pain Skin: + R lateral chest wall laceration Neurological: + head injury.   ____________________________________________   PHYSICAL EXAM:  VITAL SIGNS: ED Triage Vitals  Enc Vitals Group     BP 05/12/21 1934 126/85     Pulse Rate 05/12/21 1934 (!) 106     Resp 05/12/21 1934 (!) 22     Temp 05/12/21 1934 98.2 F (36.8 C)     Temp Source 05/12/21 1934 Oral     SpO2 05/12/21 1934 92 %  Weight 05/12/21 1941 150 lb (68 kg)     Height 05/12/21 1941 5' 8"  (1.727 m)     Head Circumference --      Peak Flow --      Pain Score 05/12/21 1938 10     Pain Loc --      Pain Edu? --      Excl. in [place]? --     Full spinal precautions maintained throughout the trauma exam. Constitutional: Alert and oriented. No acute distress. Does not appear intoxicated. HEENT Head: Normocephalic and atraumatic. Face: No facial bony tenderness. Stable midface Ears: No hemotympanum bilaterally. No Battle sign Eyes: No eye injury. PERRL. No raccoon eyes Nose: Nontender. No epistaxis. No rhinorrhea Mouth/Throat: Mucous membranes are moist. No oropharyngeal blood. No dental injury. Airway patent without stridor. Normal voice. Neck: no C-collar. No midline c-spine tenderness.  Cardiovascular: Normal rate, regular rhythm. Normal and symmetric distal pulses are present in all extremities. Pulmonary/Chest: Chest wall is stable and nontender to palpation/compression. Normal respiratory effort. Breath sounds are normal. No crepitus.  Abdominal: Soft, nontender, non distended. Musculoskeletal: Nontender with normal full range of motion in all extremities. No deformities. No thoracic or lumbar midline spinal tenderness. Pelvis is stable. Large bruise on the R hip area. Skin: Skin is warm, dry and intact. Large, deep laceration on the R lateral chest wall Psychiatric: Speech and behavior are appropriate. Neurological: Normal speech and language. Moves all extremities to command. No gross focal  neurologic deficits are appreciated.  Glascow Coma Score: 4 - Opens eyes on own 6 - Follows simple motor commands 5 - Alert and oriented GCS: 15   ____________________________________________   LABS (all labs ordered are listed, but only abnormal results are displayed)  Labs Reviewed  COMPREHENSIVE METABOLIC PANEL - Abnormal; Notable for the following components:      Result Value   Sodium 134 (*)    Potassium 3.3 (*)    Chloride 88 (*)    Glucose, Bld 106 (*)    BUN 27 (*)    Creatinine, Ser 1.08 (*)    GFR, Estimated 59 (*)    All other components within normal limits  GLUCOSE, CAPILLARY - Abnormal; Notable for the following components:   Glucose-Capillary 235 (*)    All other components within normal limits  CBG MONITORING, ED - Abnormal; Notable for the following components:   Glucose-Capillary 134 (*)    All other components within normal limits  CBG MONITORING, ED - Abnormal; Notable for the following components:   Glucose-Capillary 185 (*)    All other components within normal limits  CBG MONITORING, ED - Abnormal; Notable for the following components:   Glucose-Capillary 101 (*)    All other components within normal limits  RESP PANEL BY RT-PCR (FLU A&B, COVID) ARPGX2  CBC WITH DIFFERENTIAL/PLATELET  MAGNESIUM  PROTIME-INR  BRAIN NATRIURETIC PEPTIDE  BASIC METABOLIC PANEL  HIV ANTIBODY (ROUTINE TESTING W REFLEX)  TROPONIN I (HIGH SENSITIVITY)  TROPONIN I (HIGH SENSITIVITY)   ____________________________________________  EKG  ED ECG REPORT I, Rudene Re, the attending physician, personally viewed and interpreted this ECG.  Sinus tachycardia with a rate of 101, LVH, no ST elevations or depressions ____________________________________________  RADIOLOGY  I have personally reviewed the images performed during this visit and I agree with the Radiologist's read.   Interpretation by Radiologist:  No results found.    ____________________________________________   PROCEDURES  Procedure(s) performed:yes .Marland KitchenLaceration Repair  Date/Time: 05/13/2021 5:15 AM Performed by: Rudene Re, MD Authorized  by: Rudene Re, MD   Consent:    Consent obtained:  Verbal   Consent given by:  Patient   Risks, benefits, and alternatives were discussed: yes     Risks discussed:  Infection, poor cosmetic result, nerve damage, poor wound healing and need for additional repair   Alternatives discussed:  Observation Universal protocol:    Patient identity confirmed:  Verbally with patient Anesthesia:    Anesthesia method:  Local infiltration   Local anesthetic:  Lidocaine 1% w/o epi Laceration details:    Location:  Trunk   Trunk location:  R chest   Length (cm):  10   Depth (mm):  5 Pre-procedure details:    Preparation:  Patient was prepped and draped in usual sterile fashion and imaging obtained to evaluate for foreign bodies Exploration:    Hemostasis achieved with:  Direct pressure   Imaging outcome: foreign body not noted     Wound exploration: entire depth of wound visualized     Wound extent: muscle damage and underlying fracture     Wound extent: no fascia violation noted, no foreign bodies/material noted, no nerve damage noted and no vascular damage noted     Contaminated: no   Treatment:    Area cleansed with:  Povidone-iodine   Amount of cleaning:  Extensive   Irrigation solution:  Sterile saline   Irrigation volume:  1000   Irrigation method:  Pressure wash   Layers/structures repaired:  Deep subcutaneous Deep subcutaneous:    Suture size:  4-0 and 3-0   Suture material:  Vicryl   Suture technique:  Horizontal mattress   Number of sutures:  3 Skin repair:    Repair method:  Sutures   Suture size:  3-0   Wound skin closure material used: vicryl.   Suture technique:  Simple interrupted   Number of sutures:  4 Approximation:    Approximation:  Close Repair type:    Repair  type:  Complex Post-procedure details:    Dressing:  Non-adherent dressing   Procedure completion:  Tolerated well, no immediate complications   Critical Care performed:  None ____________________________________________   INITIAL IMPRESSION / ASSESSMENT AND PLAN / ED COURSE  60 y.o. female with history as listed below who presents for evaluation after mechanical fall.  Patient with a mechanical fall 2 days ago with right-sided chest wall, upper abdominal and hip pain.  Also had trauma with acute on chronic neck pain.  She has a deep complex laceration of the right chest wall which was repaired per procedure note above.  CT of the chest that shows lateral right seventh and eighth rib fracture with no pneumothorax.  Therefore presentation is concerning for an open fracture.  Wound was washed thoroughly and sterilized with povidone iodine.  Will give IV Ancef.  CT abdomen with no traumatic injury.  No hip fracture or dislocation.  CT head and cervical spine with no acute traumatic injury.  Patient has a history of chronic pain.  Has received IV fentanyl with no significant improvement of her pain. Will give a dose of morphine and admit to the hospitalist for IV abx, pain control, incentive spirometer.  Limited records reviewed.  Patient placed on telemetry for close monitoring.  Tetanus shot is up-to-date.  EKG showing no signs of dysrhythmias or ischemia.  Labs showing no signs of anemia, significant electrolyte derangements.  Mild dehydration with a small bump in her creatinine.  Will give IV fluids and admit for pain control, IV abx for  open rib fractures, and pulmonary hygiene.        ____________________________________________  Please note:  Patient was evaluated in Emergency Department today for the symptoms described in the history of present illness. Patient was evaluated in the context of the global COVID-19 pandemic, which necessitated consideration that the patient might be at risk for  infection with the SARS-CoV-2 virus that causes COVID-19. Institutional protocols and algorithms that pertain to the evaluation of patients at risk for COVID-19 are in a state of rapid change based on information released by regulatory bodies including the CDC and federal and state organizations. These policies and algorithms were followed during the patient's care in the ED.  Some ED evaluations and interventions may be delayed as a result of limited staffing during the pandemic.   ____________________________________________   FINAL CLINICAL IMPRESSION(S) / ED DIAGNOSES   Final diagnoses:  Trauma  Open fracture of multiple ribs of right side, initial encounter      NEW MEDICATIONS STARTED DURING THIS VISIT:  ED Discharge Orders     None        Note:  This document was prepared using Dragon voice recognition software and may include unintentional dictation errors.    Rudene Re, MD 05/13/21 2312

## 2021-05-13 NOTE — Consult Note (Signed)
[facility] Nurse wound consult note Consultation was completed by review of records, images and assistance from the bedside nurse/clinical staff.  Reason for Consult:laceration Repaired in the ED after mechanical fall at home Wound type: trauma Pressure Injury POA: NA Measurement: see nursing flowsheet Wound XEX:PFRH, moist; approximated but not smooth approximation due to location I will assume  Drainage (amount, consistency, odor) unable to assess; recent repair Periwound: intact  Dressing procedure/placement/frequency:  Dry nonadherent dressing, replace PRN strike through. Requested ED staff to add orders for when sutures should be removed so that patient can be directed for follow up.   Re consult if needed, will not follow at this time. Thanks  Eusebia Grulke [employer], RN,CWOCN, CNS, [place] [phone])

## 2021-05-13 NOTE — H&P (Signed)
History and Physical    DOT SPLINTER DGU:[id number] DOB: 1961-02-18 DOA: 05/13/2021  Referring MD/NP/PA:   PCP: Townsend Roger, MD   Patient coming from:  The patient is coming from home.    Chief Complaint: fall, right chest wall laceration and pain  HPI: [name] is a 60 y.o. female with medical history significant of dementia, hyperlipidemia, diabetes mellitus, COPD, GERD, depression with anxiety, chronic pain, dCHF, melanoma, tobacco abuse, CAD, adrenal insufficiency, who presents with fall, right chest wall laceration and pain.  Patient states that she fell accidentally 2 days ago, but I called her brother, per his brother patient actually fell 7 days ago. She states that she tripped over a lamp and fell onto it, she was lacerated by broken lamp piece of glass in her right lateral chest wall, causing severe pain over that area.  The laceration is already sutured up by ED physician.  No active bleeding. Patient also complains of scalp pain, neck pain and right hip pain.  No loss of consciousness.  No unilateral numbness or tinglings in extremities.  Patient has mild cough, mild shortness breath.  She has nausea, no vomiting, diarrhea or abdominal pain.  No symptoms of UTI.   ED Course: pt was found to have WBC 10.2, troponin level 7, 9, pending COVID-19 PCR, creatinine 1.08, potassium 3.3, temperature normal, blood pressure 118/73, heart rate 106, RR 22, 18, oxygen sat 92-95% on room air.  CT of head is negative for acute intracranial abnormalities.  CT of C-spine is negative for acute injury, but showed degenerative disc disease.  X-ray of right hip is negative for bony fracture.  CT of her chest/abdomen/pelvis that showed right lateral seventh and eighth rib fracture.  Patient is admitted to [facility] bed as inpatient.  CT-abd/pelvis/chest: 1. Minimally displaced fractures of the lateral right seventh and eighth ribs. No pneumothorax. 2. A 1.7 x 2.7 cm integrated area in the lateral  right chest wall, possibly a small hematoma or seroma related to traumatic injury. No large fluid collection. 3. Sigmoid diverticulosis. No bowel obstruction. 4. Aortic Atherosclerosis (ICD10-I70.0).   Review of Systems:   General: no fevers, chills, no body weight gain, has fatigue HEENT: no blurry vision, hearing changes or sore throat Respiratory: has dyspnea, coughing, no wheezing CV: no chest pain, no palpitations GI: has nausea, no vomiting, abdominal pain, diarrhea, constipation GU: no dysuria, burning on urination, increased urinary frequency, hematuria  Ext: no leg edema Neuro: no unilateral weakness, numbness, or tingling, no vision change or hearing loss Skin: has a large laceration over right lateral chest wall which is sutured up, no active bleeding MSK: has right lateral chest wall pain  Heme: No easy bruising.  Travel history: No recent long distant travel.  Allergy:  Allergies  Allergen Reactions   Bupropion Other (See Comments), Nausea Only, Shortness Of Breath and Nausea And Vomiting    cold sweats, extended release, only can take [employer] brand Issues with extended release medications. Other reaction(s): Unknown ER- cold sweats, extended release, only can take [employer] brand Issues with extended release medications.   Levaquin [Levofloxacin In D5w] Anaphylaxis   Methylprednisolone Acetate Other (See Comments)   Amoxicillin Itching   Codeine Nausea And Vomiting   Cymbalta [Duloxetine Hcl] Swelling   Gatifloxacin Hives and Swelling    Other Reaction: Other reaction: Swelling   Guaifenesin & Derivatives Hives and Swelling   Keflex [Cephalexin] Other (See Comments)    Unknown   Klonopin [Clonazepam] Other (See  Comments)    "Makes me have bad thoughts"   Macrobid [Nitrofurantoin Monohyd Macro] Other (See Comments)    "kidneys shuts down" Chills, Fever.   Penicillins Hives    Has patient had a PCN reaction causing immediate rash, facial/tongue/throat swelling,  SOB or lightheadedness with hypotension: Yes Has patient had a PCN reaction causing severe rash involving mucus membranes or skin necrosis: No Has patient had a PCN reaction that required hospitalization: No Has patient had a PCN reaction occurring within the last 10 years: No If all of the above answers are "NO", then may proceed with Cephalosporin use.   Sulfa Antibiotics Hives   Amitriptyline Rash    "It makes me want to fall."   Other Rash    cortisol    Past Medical History:  Diagnosis Date   Adrenal insufficiency (HCC)    Anxiety    CAD (coronary artery disease)    Cancer (HCC)    skin   Chronic diastolic CHF (congestive heart failure) (HCC)    Chronic pain    COPD (chronic obstructive pulmonary disease) (HCC)    Cushing disease (HCC)    Dementia (HCC)    GERD (gastroesophageal reflux disease)    HLD (hyperlipidemia)     Past Surgical History:  Procedure Laterality Date   APPENDECTOMY     HIP CLOSED REDUCTION Left 12/06/2019   Procedure: CLOSED MANIPULATION HIP;  Surgeon: Lovell Sheehan, MD;  Location: [facility] ORS;  Service: Orthopedics;  Laterality: Left;   NASAL SEPTUM SURGERY     NASAL SINUS SURGERY     UMBILICAL HERNIA REPAIR     VAGINAL HYSTERECTOMY      Social History:  reports that she has been smoking cigarettes. She has been smoking an average of .5 packs per day. She has never used smokeless tobacco. She reports current alcohol use. She reports that she does not use drugs.  Family History:  Family History  Problem Relation Age of Onset   Ovarian cancer Mother    Breast cancer Mother    Brain cancer Mother    CAD Father    Diabetes Father    Hepatitis Father    Liver disease Father    Cancer Father    Alcohol abuse Father    Heart attack Brother    Sleep apnea Brother    Diabetes Brother    ALS Paternal Grandmother    Stroke Maternal Grandmother      Prior to Admission medications   Medication Sig Start Date End Date Taking? Authorizing  Provider  albuterol (PROAIR HFA) 108 (90 BASE) MCG/ACT inhaler Inhale 2 puffs into the lungs every 6 (six) hours as needed. 06/04/15   [provider]  ALPRAZolam Duanne Moron) 1 MG tablet Take 1 mg by mouth 4 (four) times daily.     [provider]  benzonatate (TESSALON) 200 MG capsule Take by mouth. 03/01/20   [provider]  buPROPion (WELLBUTRIN SR) 100 MG 12 hr tablet Take 100 mg by mouth 2 (two) times daily. 08/16/19   [provider]  Calcium Carbonate-Vitamin D 600-400 MG-UNIT tablet Take 1 tablet by mouth daily. 05/22/15 09/07/17  [provider]  cyclobenzaprine (FLEXERIL) 10 MG tablet Take 10 mg by mouth 3 (three) times daily as needed.     [provider]  donepezil (ARICEPT) 10 MG tablet Take 10 mg by mouth at bedtime.    [provider]  doxepin (SINEQUAN) 25 MG capsule Take by mouth. 03/01/20   [provider]  doxepin (SINEQUAN) 50 MG capsule Take by mouth. 03/10/18   [provider]  doxycycline (MONODOX) 100 MG capsule Take 100 mg by mouth 2 (two) times daily. 09/25/19   [provider]  doxycycline (MONODOX) 100 MG capsule Take 1 capsule (100 mg total) by mouth 2 (two) times daily. 04/11/21   Sable Feil, PA-C  EPINEPHrine 0.3 mg/0.3 mL IJ SOAJ injection Inject 0.3 mg into the muscle once. 04/01/18   [provider]  fluticasone (FLONASE) 50 MCG/ACT nasal spray Place 2 sprays into the nose daily. 01/02/13   [provider]  Fluticasone-Salmeterol (ADVAIR DISKUS) 250-50 MCG/DOSE AEPB Inhale 1 puff into the lungs every 12 (twelve) hours. 06/04/15 09/07/17  [provider]  furosemide (LASIX) 80 MG tablet Take by mouth. 03/01/20   [provider]  gabapentin (NEURONTIN) 600 MG tablet Take 1 tablet by mouth 3 (three) times daily.    [provider]  glipiZIDE (GLUCOTROL) 5 MG tablet Take 5 mg by mouth daily. 08/16/19   [provider]  hydrOXYzine  (ATARAX/VISTARIL) 25 MG tablet Take 25 mg by mouth 3 (three) times daily as needed for itching. 03/23/18   [provider]  magnesium oxide (MAG-OX) 400 MG tablet Take 400 mg by mouth daily.    [provider]  meclizine (ANTIVERT) 25 MG tablet Take 1 tablet (25 mg total) by mouth 3 (three) times daily as needed for dizziness. 09/14/19   Danford, Suann Larry, MD  memantine (NAMENDA) 5 MG tablet Take by mouth. 03/01/20   [provider]  NARCAN 4 MG/0.1ML LIQD nasal spray kit SMARTSIG:Both Nares 03/01/20   [provider]  omeprazole (PRILOSEC) 40 MG capsule Take 1 capsule by mouth 2 (two) times daily. 02/19/15 09/07/17  [provider]  ondansetron (ZOFRAN-ODT) 4 MG disintegrating tablet Take 1 tablet (4 mg total) by mouth every 8 (eight) hours as needed for nausea or vomiting. 09/14/19   Danford, Suann Larry, MD  oxyCODONE-acetaminophen (PERCOCET) 10-325 MG tablet Take 1 tablet by mouth every 4 (four) hours as needed for pain (pt has to take [place]ro[place] brand).  10/05/12   [provider]  potassium chloride SA (K-DUR,KLOR-CON) 20 MEQ tablet Take 1 tablet (20 mEq total) by mouth 2 (two) times daily. 09/09/17   Epifanio Lesches, MD  predniSONE (DELTASONE) 5 MG tablet Take 15 mg by mouth daily with breakfast.    [provider]  PREMARIN vaginal cream Place vaginally. 03/01/20   [provider]  promethazine (PHENERGAN) 25 MG tablet Take 1 tablet (25 mg total) by mouth every 8 (eight) hours as needed for nausea or vomiting. 09/14/19   Edwin Dada, MD    Physical Exam: Vitals:   05/13/21 0048 05/13/21 0232 05/13/21 0436 05/13/21 0746  BP: 118/81 121/62 118/73 127/70  Pulse: (!) 101 (!) 103 93 (!) 103  Resp: _0 Temp: 98.4 F (36.9 C) 98.2 F (36.8 C) 98.2 F (36.8 C) (!) 97.4 F (36.3 C)  TempSrc: Oral Oral Oral Oral  SpO2: 95% 95% 95% 92%  Weight:      Height:       General: Not in acute  distress HEENT:       Eyes: PERRL, EOMI, no scleral icterus.       ENT: No discharge from the ears and nose       Neck: No JVD, no bruit, no mass felt. Heme: No neck lymph node enlargement. Cardiac: S1/S2, RRR, No murmurs, No  gallops or rubs. Respiratory: No rales, wheezing, rhonchi or rubs. GI: Soft, nondistended, nontender, no rebound pain, no organomegaly, BS present. GU: No hematuria Ext: has trace leg edema bilaterally, has chronic venous insufficiency change in both legs,  1+DP/PT pulse bilaterally. Skin: has a large laceration over right lateral chest wall which is sutured up, no active bleeding     MSK: has right lateral chest wall pain . Neuro: Alert, oriented X3, cranial nerves II-XII grossly intact, moves all extremities normally.  Psych: Patient is not psychotic, no suicidal or hemocidal ideation.  Labs on Admission: I have personally reviewed following labs and imaging studies  CBC: Recent Labs  Lab 05/12/21 1946  WBC 10.2  NEUTROABS 5.2  HGB 13.4  HCT 39.0  MCV 91.5  PLT 474   Basic Metabolic Panel: Recent Labs  Lab 05/12/21 1946 05/13/21 0014  NA 134*  --   K 3.3*  --   CL 88*  --   CO2 32  --   GLUCOSE 106*  --   BUN 27*  --   CREATININE 1.08*  --   CALCIUM 9.1  --   MG  --  1.8   GFR: Estimated Creatinine Clearance: 56.6 mL/min (A) (by C-G formula based on SCr of 1.08 mg/dL (H)). Liver Function Tests: Recent Labs  Lab 05/12/21 1946  AST 23  ALT 31  ALKPHOS 79  BILITOT 0.8  PROT 7.5  ALBUMIN 4.1   No results for input(s): LIPASE, AMYLASE in the last 168 hours. No results for input(s): AMMONIA in the last 168 hours. Coagulation Profile: Recent Labs  Lab 05/13/21 0755  INR 0.9   Cardiac Enzymes: No results for input(s): CKTOTAL, CKMB, CKMBINDEX, TROPONINI in the last 168 hours. BNP (last 3 results) No results for input(s): PROBNP in the last 8760 hours. HbA1C: No results for input(s): HGBA1C in the last 72 hours. CBG: Recent  Labs  Lab 05/13/21 0751  GLUCAP 134*   Lipid Profile: No results for input(s): CHOL, HDL, LDLCALC, TRIG, CHOLHDL, LDLDIRECT in the last 72 hours. Thyroid Function Tests: No results for input(s): TSH, T4TOTAL, FREET4, T3FREE, THYROIDAB in the last 72 hours. Anemia Panel: No results for input(s): VITAMINB12, FOLATE, FERRITIN, TIBC, IRON, RETICCTPCT in the last 72 hours. Urine analysis:    Component Value Date/Time   COLORURINE COLORLESS (A) 11/07/2020 1124   APPEARANCEUR CLEAR (A) 11/07/2020 1124   LABSPEC 1.001 (L) 11/07/2020 1124   PHURINE 6.0 11/07/2020 1124   GLUCOSEU NEGATIVE 11/07/2020 1124   HGBUR NEGATIVE 11/07/2020 1124   BILIRUBINUR NEGATIVE 11/07/2020 1124   [place] 11/07/2020 1124   PROTEINUR NEGATIVE 11/07/2020 1124   NITRITE NEGATIVE 11/07/2020 1124   LEUKOCYTESUR NEGATIVE 11/07/2020 1124   Sepsis Labs: _0 (procalcitonin:4,lacticidven:4) ) Recent Results (from the past 240 hour(s))  Resp Panel by RT-PCR (Flu A&B, Covid) Nasopharyngeal Swab     Status: None   Collection Time: 05/13/21  7:44 AM   Specimen: Nasopharyngeal Swab; Nasopharyngeal(NP) swabs in vial transport medium  Result Value Ref Range Status   SARS Coronavirus 2 by RT PCR NEGATIVE NEGATIVE Final    Comment: (NOTE) SARS-CoV-2 target nucleic acids are NOT DETECTED.  The SARS-CoV-2 RNA is generally detectable in upper respiratory specimens during the acute phase of infection. The lowest concentration of SARS-CoV-2 viral copies this assay can detect is 138 copies/mL. A negative result does not preclude SARS-Cov-2 infection and should not be used as the sole basis for treatment or other patient management decisions. A negative result may  occur with  improper specimen collection/handling, submission of specimen other than nasopharyngeal swab, presence of viral mutation(s) within the areas targeted by this assay, and inadequate number of viral copies(<138 copies/mL). A negative result  must be combined with clinical observations, patient history, and epidemiological information. The expected result is Negative.  Fact Sheet for Patients:  EntrepreneurPulse.com.au  Fact Sheet for Healthcare Providers:  IncredibleEmployment.be  This test is no t yet approved or cleared by the [place] FDA and  has been authorized for detection and/or diagnosis of SARS-CoV-2 by FDA under an Emergency Use Authorization (EUA). This EUA will remain  in effect (meaning this test can be used) for the duration of the COVID-19 declaration under Section 564(b)(1) of the Act, 21 U.S.C.section 360bbb-3(b)(1), unless the authorization is terminated  or revoked sooner.       Influenza A by PCR NEGATIVE NEGATIVE Final   Influenza B by PCR NEGATIVE NEGATIVE Final    Comment: (NOTE) The Xpert Xpress SARS-CoV-2/FLU/RSV plus assay is intended as an aid in the diagnosis of influenza from Nasopharyngeal swab specimens and should not be used as a sole basis for treatment. Nasal washings and aspirates are unacceptable for Xpert Xpress SARS-CoV-2/FLU/RSV testing.  Fact Sheet for Patients: EntrepreneurPulse.com.au  Fact Sheet for Healthcare Providers: IncredibleEmployment.be  This test is not yet approved or cleared by the [place] FDA and has been authorized for detection and/or diagnosis of SARS-CoV-2 by FDA under an Emergency Use Authorization (EUA). This EUA will remain in effect (meaning this test can be used) for the duration of the COVID-19 declaration under Section 564(b)(1) of the Act, 21 U.S.C. section 360bbb-3(b)(1), unless the authorization is terminated or revoked.  Performed at [facility], [address] [place], [place] [address]      Radiological Exams on Admission: CT HEAD WO CONTRAST (5MM)  Result Date: 05/12/2021 CLINICAL DATA:  Polytrauma, critical, head/C-spine injury  suspected. Fall. EXAM: CT HEAD WITHOUT CONTRAST TECHNIQUE: Contiguous axial images were obtained from the base of the skull through the vertex without intravenous contrast. COMPARISON:  11/07/2020 FINDINGS: Brain: No acute intracranial abnormality. Specifically, no hemorrhage, hydrocephalus, mass lesion, acute infarction, or significant intracranial injury. Vascular: No hyperdense vessel or unexpected calcification. Skull: No acute calvarial abnormality. Sinuses/Orbits: No acute findings Other: None IMPRESSION: No acute intracranial abnormality. Electronically Signed   By: Rolm Baptise M.D.   On: 05/12/2021 21:12   CT Cervical Spine Wo Contrast  Result Date: 05/12/2021 CLINICAL DATA:  Fall. Polytrauma, critical, head/C-spine injury suspected EXAM: CT CERVICAL SPINE WITHOUT CONTRAST TECHNIQUE: Multidetector CT imaging of the cervical spine was performed without intravenous contrast. Multiplanar CT image reconstructions were also generated. COMPARISON:  11/07/2020 FINDINGS: Alignment: 4 mm of anterolisthesis of C5 on C6. 5 mm of anterolisthesis of C6 on C7. 3 mm of anterolisthesis of C7 on T1. Findings are stable. These are related to diffuse advanced degenerative facet disease. Skull base and vertebrae: No visible fracture. No focal bone lesion. Soft tissues and spinal canal: No prevertebral fluid or swelling. No visible canal hematoma. Disc levels:  Prior anterior at C4-5. Upper chest: No acute findings Other: None IMPRESSION: Diffuse advanced degenerative disc disease and facet disease. Grade 1-2 anterolisthesis at multiple levels as above, stable since prior study. No acute bony abnormality. Electronically Signed   By: Rolm Baptise M.D.   On: 05/12/2021 21:17   CT CHEST ABDOMEN PELVIS W CONTRAST  Result Date: 05/12/2021 CLINICAL DATA:  Chest trauma. EXAM: CT CHEST, ABDOMEN, AND PELVIS WITH CONTRAST  TECHNIQUE: Multidetector CT imaging of the chest, abdomen and pelvis was performed following the standard  protocol during bolus administration of intravenous contrast. CONTRAST:  120m OMNIPAQUE IOHEXOL 300 MG/ML  SOLN COMPARISON:  Chest radiograph dated 11/07/2020. CT abdomen pelvis dated 09/12/2019. FINDINGS: CT CHEST FINDINGS Cardiovascular: No cardiomegaly. Trace pericardial effusion. Coronary vascular calcification. Mild atherosclerotic calcification of the thoracic aorta. No aneurysmal dilatation or dissection. The origins of the great vessels of the aortic arch appear patent as visualized. The central pulmonary arteries appear unremarkable. Mediastinum/Nodes: No hilar or mediastinal adenopathy. The esophagus is grossly unremarkable. No mediastinal fluid collection. Lungs/Pleura: Bibasilar subpleural atelectasis. No focal consolidation, pleural effusion, or pneumothorax. The central airways are patent. Musculoskeletal: Degenerative changes of the spine and cervical ACDF. Multilevel cervical anterolisthesis. Minimally displaced fractures of the lateral right seventh and eighth ribs. Several additional old healed bilateral rib fractures noted. There is a 1.7 x 2.7 cm integrated area in the lateral right chest wall (39/3), possibly a small hematoma or seroma related to traumatic injury. No large fluid collection. CT ABDOMEN PELVIS FINDINGS No intra-abdominal free air or free fluid. Hepatobiliary: The liver is unremarkable. There is mild intrahepatic biliary dilatation. The gallbladder is unremarkable. Pancreas: Unremarkable. No pancreatic ductal dilatation or surrounding inflammatory changes. Spleen: Normal in size without focal abnormality. Adrenals/Urinary Tract: The adrenal glands are unremarkable. There is a 3 mm nonobstructing left renal upper pole calculus. No hydronephrosis. There is symmetric enhancement and excretion of contrast by both kidneys. The visualized ureters and urinary bladder appear unremarkable. Stomach/Bowel: There is sigmoid diverticulosis without active inflammatory changes. There is no bowel  obstruction or active inflammation. Appendectomy. Vascular/Lymphatic: Mild aortoiliac atherosclerotic disease. The IVC is unremarkable. No portal gas. There is no adenopathy. Reproductive: Hysterectomy. Other: None Musculoskeletal: Degenerative changes of the spine. Total left hip arthroplasty. Old right pubic bone fracture. No acute osseous pathology. IMPRESSION: 1. Minimally displaced fractures of the lateral right seventh and eighth ribs. No pneumothorax. 2. A 1.7 x 2.7 cm integrated area in the lateral right chest wall, possibly a small hematoma or seroma related to traumatic injury. No large fluid collection. 3. Sigmoid diverticulosis. No bowel obstruction. 4. Aortic Atherosclerosis (ICD10-I70.0). Electronically Signed   By: AAnner CreteM.D.   On: 05/12/2021 21:21   DG Hip Unilat W or Wo Pelvis 2-3 Views Right  Result Date: 05/12/2021 CLINICAL DATA:  Fall, right hip pain EXAM: DG HIP (WITH OR WITHOUT PELVIS) 2-3V RIGHT COMPARISON:  None. FINDINGS: Prior left hip replacement. Remote healed right superior and inferior pubic rami fractures. No acute fracture, subluxation or dislocation. IMPRESSION: No acute bony abnormality. Electronically Signed   By: KRolm BaptiseM.D.   On: 05/12/2021 20:47     EKG: I have personally reviewed.  Sinus rhythm, QTC 451, low voltage, LAE   Assessment/Plan Principal Problem:   Open fracture of rib of right side_7th and 8th rib Active Problems:   GERD (gastroesophageal reflux disease)   COPD (chronic obstructive pulmonary disease) (HCC)   Adrenal insufficiency (HCC)   Chronic diastolic CHF (congestive heart failure) (HCC)   Type 2 diabetes mellitus without complication (HCC)   Dementia without behavioral disturbance (HCC)   Tobacco abuse   Hypokalemia   Fall at home, initial encounter   Laceration of right chest wall   Depression with anxiety   Open fracture of rib of right side_7th and 8th rib and laceration of right chest wall: Currently patient does  not have fever or leukocytosis, no signs for infection now, but  patient is at high risk of developing infection.  -Admitted to MedSurg bed as inpatient -Ancef empirically, 2 g, 3 times daily -Pain control: As needed Percocet, morphine, Tylenol -Incentive spirometry -wound Care consult -Fall precaution  GERD (gastroesophageal reflux disease) -Protonix  COPD (chronic obstructive pulmonary disease) ([place]): Stable -Bronchodilators  Adrenal insufficiency ([place]): Patient still taking 15 mg of prednisone at home.  She is allergic to Solu-Cortef -Start prednisone 50 mg every 6 hours  Chronic diastolic CHF (congestive heart failure) ([place]): 2D echo on 08/09/2014 showed EF > 55%, patient has trace leg edema, no respiratory distress, does not seem to have CHF exacerbation -Continue home Lasix 80 mg daily -check BNP  Type 2 diabetes mellitus without complication ([place]): Recent A1c 5.3, well controlled.  Patient taking glipizide at home -Sliding scale insulin  Dementia without behavioral disturbance ([place]) -Continue home donepezil  Tobacco abuse -Nicotine patch  Fall -Fall precaution -PT  Hypokalemia: Potassium 3.3 -Repleted potassium -check magnesium level  Depression with anxiety -Continue home medications         DVT ppx: SCD Code Status: DNR per pt and her brother who is POA Family Communication:   Yes, patient's   brother by phone Disposition Plan:  Anticipate discharge back to previous environment Consults called:  none Admission status and Level of care: Med-Surg:  as inpt      Status is: Inpatient  Remains inpatient appropriate because: Patient has multiple complaints, now presents with fall, a large laceration over right chest wall, and rib fracture.  Patient is at high risk of developing infection, at high risk of deteriorating.  Will need to be treated in hospital for at least 2 days   Date of Service 05/13/2021    [facility] [facility]   If  7PM-7AM, please contact night-coverage [email or web address] 05/13/2021, 9:37 AM

## 2021-05-13 NOTE — ED Notes (Signed)
Pt placed in hospital bed with no issue. Call light in reach, bed low.

## 2021-05-14 DIAGNOSIS — S2241XB Multiple fractures of ribs, right side, initial encounter for open fracture: Secondary | ICD-10-CM | POA: Diagnosis not present

## 2021-05-14 LAB — GLUCOSE, CAPILLARY
Glucose-Capillary: 191 mg/dL — ABNORMAL HIGH (ref 70–99)
Glucose-Capillary: 237 mg/dL — ABNORMAL HIGH (ref 70–99)
Glucose-Capillary: 223 mg/dL — ABNORMAL HIGH (ref 70–99)
Glucose-Capillary: 167 mg/dL — ABNORMAL HIGH (ref 70–99)

## 2021-05-14 LAB — BASIC METABOLIC PANEL
Anion gap: 11 (ref 5–15)
BUN: 24 mg/dL — ABNORMAL HIGH (ref 6–20)
CO2: 29 mmol/L (ref 22–32)
Calcium: 9.4 mg/dL (ref 8.9–10.3)
Chloride: 94 mmol/L — ABNORMAL LOW (ref 98–111)
Creatinine, Ser: 1.33 mg/dL — ABNORMAL HIGH (ref 0.44–1.00)
GFR, Estimated: 46 mL/min — ABNORMAL LOW (ref >60)
Glucose, Bld: 238 mg/dL — ABNORMAL HIGH (ref 70–99)
Potassium: 3.6 mmol/L (ref 3.5–5.1)
Sodium: 134 mmol/L — ABNORMAL LOW (ref 135–145)

## 2021-05-14 LAB — HIV ANTIBODY (ROUTINE TESTING W REFLEX): HIV Screen 4th Generation wRfx: NONREACTIVE

## 2021-05-14 MED ORDER — PREDNISONE 10 MG PO TABS
15.0000 mg | ORAL_TABLET | Freq: Every day | ORAL | Status: DC
  Administered 2021-05-15 – 2021-05-16 (×2): 15 mg via ORAL
  Filled 2021-05-14 (×2): qty 2

## 2021-05-14 MED ORDER — OXYCODONE-ACETAMINOPHEN 5-325 MG PO TABS
2.0000 | ORAL_TABLET | ORAL | Status: DC | PRN
  Administered 2021-05-14 – 2021-05-16 (×3): 2 via ORAL
  Filled 2021-05-14 (×3): qty 2

## 2021-05-14 MED ORDER — ALPRAZOLAM 0.5 MG PO TABS
1.0000 mg | ORAL_TABLET | Freq: Four times a day (QID) | ORAL | Status: DC | PRN
  Administered 2021-05-14 – 2021-05-16 (×6): 1 mg via ORAL
  Filled 2021-05-14 (×7): qty 2

## 2021-05-14 MED ORDER — MORPHINE SULFATE 15 MG PO TABS
15.0000 mg | ORAL_TABLET | ORAL | Status: DC | PRN
  Administered 2021-05-14 – 2021-05-16 (×6): 15 mg via ORAL
  Filled 2021-05-14 (×6): qty 1

## 2021-05-14 NOTE — Plan of Care (Signed)

## 2021-05-14 NOTE — Progress Notes (Signed)
PROGRESS NOTE    [name]  PVV:[id number] DOB: 03/22/1961 DOA: 05/13/2021 PCP: Townsend Roger, MD  [id number]/[id number]-AA   Assessment & Plan:   Principal Problem:   Open fracture of rib of right side_7th and 8th rib Active Problems:   GERD (gastroesophageal reflux disease)   COPD (chronic obstructive pulmonary disease) (HCC)   Adrenal insufficiency (HCC)   Chronic diastolic CHF (congestive heart failure) ([place])   Type 2 diabetes mellitus without complication ([place])   Dementia without behavioral disturbance ([place])   Tobacco abuse   Hypokalemia   Fall at home, initial encounter   Laceration of right chest wall   Depression with anxiety   [name] is a 60 y.o. female with medical history significant of dementia, diabetes mellitus, COPD, depression with anxiety, chronic pain, dCHF, melanoma, tobacco abuse, CAD, adrenal insufficiency, who presented with fall, right chest wall laceration and pain.   Patient states that she fell accidentally 2 days ago, but per brother patient actually fell 7 days ago. She states that she tripped over a lamp and fell onto it, she was lacerated by broken lamp piece of glass in her right lateral chest wall, causing severe pain over that area.  The laceration is already sutured up by ED physician.  No active bleeding.   Minimally displaced fractures of the lateral right seventh and eighth ribs --cont home oxycodone --d/c IV morphine and switch to oral morphine for extra pain -Incentive spirometry  Skin laceration  --over right posterior lateral chest wall --sutured by ED provider --started on ppx Ancef on admission.  Currently no signs of infection Plan: --cont Ancef for now  GERD (gastroesophageal reflux disease) -Protonix   COPD (chronic obstructive pulmonary disease) ([place]): Stable --cont home bronchodilators   Adrenal insufficiency [facility]):  Patient still taking 15 mg of prednisone at home.  She is reportedly allergic to Solu-Cortef -started  on prednisone 50 mg every 6 hours on admission Plan: --change back to home dose of prednisone 15 mg daily starting tomorrow   Chronic diastolic CHF (congestive heart failure) ([place]): 2D echo on 08/09/2014 showed EF > 55%, patient has trace leg edema, no respiratory distress, does not seem to have CHF exacerbation -Continue home Lasix 80 mg daily   Type 2 diabetes mellitus without complication ([place]), well controlled Hyperglycemia 2/2 steroid use Recent A1c 5.3, well controlled.  Patient taking glipizide at home --SSI ACHS for now due to elevated BG   Dementia without behavioral disturbance (HCC) -Continue home donepezil   Tobacco abuse --declined nicotine patch   Fall -Fall precaution -PT   Hypokalemia:  --monitor and replete PRN   Depression with anxiety --cont home Xanax 1 mg QID PRN  Chronic pain on chronic opioids --cont home percocet 10, q4h PRN  Diffuse advanced degenerative cervical disc disease and facet disease Grade 1-2 anterolisthesis at multiple levels  --followed by ortho Dr. Macario Carls at [facility], who had referred pt to neurosurgery for surgical fixation. --cont outpatient followup at [facility]    DVT prophylaxis: SCD/Compression stockings Code Status: DNR  Family Communication:  Level of care: Med-Surg Dispo:   The patient is from: home Anticipated d/c is to: home Anticipated d/c date is: tomorrow Patient currently is medically ready to d/c.   Subjective and Interval History:  Normal oral intake.  No problem taking breaths.  Asking for a lot of consults that are appropriate for outpatient.   Objective: Vitals:   05/14/21 0437 05/14/21 0741 05/14/21 1208 05/14/21 1652  BP: (!) 141/76 128/69  118/78 (!) 124/58  Pulse: (!) 103 99 (!) 108 (!) 110  Resp: 16  17 16   Temp:  98.3 F (36.8 C) 98.3 F (36.8 C) 97.9 F (36.6 C)  TempSrc:      SpO2: 97% 99% 100% 100%  Weight:      Height:        Intake/Output Summary (Last 24 hours) at 05/14/2021 1734 Last  data filed at 05/14/2021 1411 Gross per 24 hour  Intake 320 ml  Output --  Net 320 ml   Filed Weights   05/12/21 1941  Weight: 68 kg    Examination:   Constitutional: NAD, AAOx3 HEENT: conjunctivae and lids normal, EOMI CV: No cyanosis.   RESP: normal respiratory effort, on RA Extremities: No effusions, edema in BLE SKIN: warm, dry.  Suture line over right posterior-lateral chest wall clean and dry. Neuro: II - XII grossly intact.     Data Reviewed: I have personally reviewed following labs and imaging studies  CBC: Recent Labs  Lab 05/12/21 1946  WBC 10.2  NEUTROABS 5.2  HGB 13.4  HCT 39.0  MCV 91.5  PLT 272   Basic Metabolic Panel: Recent Labs  Lab 05/12/21 1946 05/13/21 0014 05/14/21 0120  NA 134*  --  134*  K 3.3*  --  3.6  CL 88*  --  94*  CO2 32  --  29  GLUCOSE 106*  --  238*  BUN 27*  --  24*  CREATININE 1.08*  --  1.33*  CALCIUM 9.1  --  9.4  MG  --  1.8  --    GFR: Estimated Creatinine Clearance: 45.9 mL/min (A) (by C-G formula based on SCr of 1.33 mg/dL (H)). Liver Function Tests: Recent Labs  Lab 05/12/21 1946  AST 23  ALT 31  ALKPHOS 79  BILITOT 0.8  PROT 7.5  ALBUMIN 4.1   No results for input(s): LIPASE, AMYLASE in the last 168 hours. No results for input(s): AMMONIA in the last 168 hours. Coagulation Profile: Recent Labs  Lab 05/13/21 0755  INR 0.9   Cardiac Enzymes: No results for input(s): CKTOTAL, CKMB, CKMBINDEX, TROPONINI in the last 168 hours. BNP (last 3 results) No results for input(s): PROBNP in the last 8760 hours. HbA1C: No results for input(s): HGBA1C in the last 72 hours. CBG: Recent Labs  Lab 05/13/21 1717 05/13/21 2047 05/14/21 0744 05/14/21 1208 05/14/21 1655  GLUCAP 101* 235* 191* 237* 223*   Lipid Profile: No results for input(s): CHOL, HDL, LDLCALC, TRIG, CHOLHDL, LDLDIRECT in the last 72 hours. Thyroid Function Tests: No results for input(s): TSH, T4TOTAL, FREET4, T3FREE, THYROIDAB in the  last 72 hours. Anemia Panel: No results for input(s): VITAMINB12, FOLATE, FERRITIN, TIBC, IRON, RETICCTPCT in the last 72 hours. Sepsis Labs: No results for input(s): PROCALCITON, LATICACIDVEN in the last 168 hours.  Recent Results (from the past 240 hour(s))  Resp Panel by RT-PCR (Flu A&B, Covid) Nasopharyngeal Swab     Status: None   Collection Time: 05/13/21  7:44 AM   Specimen: Nasopharyngeal Swab; Nasopharyngeal(NP) swabs in vial transport medium  Result Value Ref Range Status   SARS Coronavirus 2 by RT PCR NEGATIVE NEGATIVE Final    Comment: (NOTE) SARS-CoV-2 target nucleic acids are NOT DETECTED.  The SARS-CoV-2 RNA is generally detectable in upper respiratory specimens during the acute phase of infection. The lowest concentration of SARS-CoV-2 viral copies this assay can detect is 138 copies/mL. A negative result does not preclude SARS-Cov-2 infection and should not  be used as the sole basis for treatment or other patient management decisions. A negative result may occur with  improper specimen collection/handling, submission of specimen other than nasopharyngeal swab, presence of viral mutation(s) within the areas targeted by this assay, and inadequate number of viral copies(<138 copies/mL). A negative result must be combined with clinical observations, patient history, and epidemiological information. The expected result is Negative.  Fact Sheet for Patients:  EntrepreneurPulse.com.au  Fact Sheet for Healthcare Providers:  IncredibleEmployment.be  This test is no t yet approved or cleared by the [place] FDA and  has been authorized for detection and/or diagnosis of SARS-CoV-2 by FDA under an Emergency Use Authorization (EUA). This EUA will remain  in effect (meaning this test can be used) for the duration of the COVID-19 declaration under Section 564(b)(1) of the Act, 21 U.S.C.section 360bbb-3(b)(1), unless the authorization is  terminated  or revoked sooner.       Influenza A by PCR NEGATIVE NEGATIVE Final   Influenza B by PCR NEGATIVE NEGATIVE Final    Comment: (NOTE) The Xpert Xpress SARS-CoV-2/FLU/RSV plus assay is intended as an aid in the diagnosis of influenza from Nasopharyngeal swab specimens and should not be used as a sole basis for treatment. Nasal washings and aspirates are unacceptable for Xpert Xpress SARS-CoV-2/FLU/RSV testing.  Fact Sheet for Patients: EntrepreneurPulse.com.au  Fact Sheet for Healthcare Providers: IncredibleEmployment.be  This test is not yet approved or cleared by the [place] FDA and has been authorized for detection and/or diagnosis of SARS-CoV-2 by FDA under an Emergency Use Authorization (EUA). This EUA will remain in effect (meaning this test can be used) for the duration of the COVID-19 declaration under Section 564(b)(1) of the Act, 21 U.S.C. section 360bbb-3(b)(1), unless the authorization is terminated or revoked.  Performed at [facility], [address] [place], [place] [address]       Radiology Studies: CT HEAD WO CONTRAST (5MM)  Result Date: 05/12/2021 CLINICAL DATA:  Polytrauma, critical, head/C-spine injury suspected. Fall. EXAM: CT HEAD WITHOUT CONTRAST TECHNIQUE: Contiguous axial images were obtained from the base of the skull through the vertex without intravenous contrast. COMPARISON:  11/07/2020 FINDINGS: Brain: No acute intracranial abnormality. Specifically, no hemorrhage, hydrocephalus, mass lesion, acute infarction, or significant intracranial injury. Vascular: No hyperdense vessel or unexpected calcification. Skull: No acute calvarial abnormality. Sinuses/Orbits: No acute findings Other: None IMPRESSION: No acute intracranial abnormality. Electronically Signed   By: Rolm Baptise M.D.   On: 05/12/2021 21:12   CT Cervical Spine Wo Contrast  Result Date: 05/12/2021 CLINICAL DATA:  Fall.  Polytrauma, critical, head/C-spine injury suspected EXAM: CT CERVICAL SPINE WITHOUT CONTRAST TECHNIQUE: Multidetector CT imaging of the cervical spine was performed without intravenous contrast. Multiplanar CT image reconstructions were also generated. COMPARISON:  11/07/2020 FINDINGS: Alignment: 4 mm of anterolisthesis of C5 on C6. 5 mm of anterolisthesis of C6 on C7. 3 mm of anterolisthesis of C7 on T1. Findings are stable. These are related to diffuse advanced degenerative facet disease. Skull base and vertebrae: No visible fracture. No focal bone lesion. Soft tissues and spinal canal: No prevertebral fluid or swelling. No visible canal hematoma. Disc levels:  Prior anterior at C4-5. Upper chest: No acute findings Other: None IMPRESSION: Diffuse advanced degenerative disc disease and facet disease. Grade 1-2 anterolisthesis at multiple levels as above, stable since prior study. No acute bony abnormality. Electronically Signed   By: Rolm Baptise M.D.   On: 05/12/2021 21:17   CT CHEST ABDOMEN PELVIS W CONTRAST  Result Date: 05/12/2021 CLINICAL DATA:  Chest trauma. EXAM: CT CHEST, ABDOMEN, AND PELVIS WITH CONTRAST TECHNIQUE: Multidetector CT imaging of the chest, abdomen and pelvis was performed following the standard protocol during bolus administration of intravenous contrast. CONTRAST:  151mL OMNIPAQUE IOHEXOL 300 MG/ML  SOLN COMPARISON:  Chest radiograph dated 11/07/2020. CT abdomen pelvis dated 09/12/2019. FINDINGS: CT CHEST FINDINGS Cardiovascular: No cardiomegaly. Trace pericardial effusion. Coronary vascular calcification. Mild atherosclerotic calcification of the thoracic aorta. No aneurysmal dilatation or dissection. The origins of the great vessels of the aortic arch appear patent as visualized. The central pulmonary arteries appear unremarkable. Mediastinum/Nodes: No hilar or mediastinal adenopathy. The esophagus is grossly unremarkable. No mediastinal fluid collection. Lungs/Pleura: Bibasilar  subpleural atelectasis. No focal consolidation, pleural effusion, or pneumothorax. The central airways are patent. Musculoskeletal: Degenerative changes of the spine and cervical ACDF. Multilevel cervical anterolisthesis. Minimally displaced fractures of the lateral right seventh and eighth ribs. Several additional old healed bilateral rib fractures noted. There is a 1.7 x 2.7 cm integrated area in the lateral right chest wall (39/3), possibly a small hematoma or seroma related to traumatic injury. No large fluid collection. CT ABDOMEN PELVIS FINDINGS No intra-abdominal free air or free fluid. Hepatobiliary: The liver is unremarkable. There is mild intrahepatic biliary dilatation. The gallbladder is unremarkable. Pancreas: Unremarkable. No pancreatic ductal dilatation or surrounding inflammatory changes. Spleen: Normal in size without focal abnormality. Adrenals/Urinary Tract: The adrenal glands are unremarkable. There is a 3 mm nonobstructing left renal upper pole calculus. No hydronephrosis. There is symmetric enhancement and excretion of contrast by both kidneys. The visualized ureters and urinary bladder appear unremarkable. Stomach/Bowel: There is sigmoid diverticulosis without active inflammatory changes. There is no bowel obstruction or active inflammation. Appendectomy. Vascular/Lymphatic: Mild aortoiliac atherosclerotic disease. The IVC is unremarkable. No portal gas. There is no adenopathy. Reproductive: Hysterectomy. Other: None Musculoskeletal: Degenerative changes of the spine. Total left hip arthroplasty. Old right pubic bone fracture. No acute osseous pathology. IMPRESSION: 1. Minimally displaced fractures of the lateral right seventh and eighth ribs. No pneumothorax. 2. A 1.7 x 2.7 cm integrated area in the lateral right chest wall, possibly a small hematoma or seroma related to traumatic injury. No large fluid collection. 3. Sigmoid diverticulosis. No bowel obstruction. 4. Aortic Atherosclerosis  (ICD10-I70.0). Electronically Signed   By: Anner Crete M.D.   On: 05/12/2021 21:21   DG Hip Unilat W or Wo Pelvis 2-3 Views Right  Result Date: 05/12/2021 CLINICAL DATA:  Fall, right hip pain EXAM: DG HIP (WITH OR WITHOUT PELVIS) 2-3V RIGHT COMPARISON:  None. FINDINGS: Prior left hip replacement. Remote healed right superior and inferior pubic rami fractures. No acute fracture, subluxation or dislocation. IMPRESSION: No acute bony abnormality. Electronically Signed   By: Rolm Baptise M.D.   On: 05/12/2021 20:47     Scheduled Meds:  buPROPion ER  100 mg Oral BID   calcium-vitamin D  1 tablet Oral Daily   donepezil  10 mg Oral QHS   fluticasone  2 spray Each Nare Daily   furosemide  80 mg Oral Daily   gabapentin  600 mg Oral TID   insulin aspart  0-5 Units Subcutaneous QHS   insulin aspart  0-9 Units Subcutaneous TID WC   magnesium oxide  400 mg Oral Daily   mometasone-formoterol  2 puff Inhalation BID   nicotine  21 mg Transdermal Daily   pantoprazole  40 mg Oral Daily   predniSONE  50 mg Oral Q6H   traZODone  100 mg Oral  QHS   Continuous Infusions:   ceFAZolin (ANCEF) IV 2 g (05/14/21 1413)     LOS: 1 day     Enzo Bi, MD [facility] If 7PM-7AM, please contact night-coverage 05/14/2021, 5:34 PM

## 2021-05-14 NOTE — Evaluation (Addendum)
Occupational Therapy Evaluation Patient Details Name: [name] MRN: [id number] DOB: October 04, 1960 Today's Date: 05/14/2021   History of Present Illness [name] is a 45yoF who comes [facility] on 10/[facility] after tripping over and falling atop a lamp. Pt arrives with Rt lateral thorax laceration and Rt hip bruising, neck pain. Pt reports fall 2d PTA, however brother endorses fall 7d PTA- further chart review reveals a documented phone call that indicated this fall occured on 03/21/21. Imaging revealing of displaced fractures of Right ribs 7 and 8. PMH: lewey body dementia, HLD, DM, COPD, GERD, despression, GAD, dCHF, adrenal insufficiceny. 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 At time of evaluation rigid collar is not in room, but reported to be at home.   Clinical Impression   Patient presenting with decreased Ind in self care, balance, functional mobility/transfers, endurance, and safety awareness. Patient reports living at home with a friend and being independent at baseline. She has had several falls and reports having some concern with ADL tasks. OT briefly discussed energy conservation and the possible need for AE to increase independence and safety with self care tasks. Pt does not have hard collar in room and reports it is at home. Cuing for safety with ambulation to bathroom this session with overall supervision. OT recommended HHOT at discharge to look at home set up and recommended needed equipment for home use to increase safety as wel as continue energy conservation strategies. Patient will benefit from acute OT to increase overall independence in the areas of ADLs, functional mobility, and safety awareness in order to safely discharge home.     Recommendations for follow up therapy are one component of a multi-disciplinary discharge planning process, led by the  attending physician.  Recommendations may be updated based on patient status, additional functional criteria and insurance authorization.   Follow Up Recommendations  Home health OT    Assistance Recommended at Discharge Intermittent Supervision/Assistance  Functional Status Assessment  Patient has had a recent decline in their functional status and demonstrates the ability to make significant improvements in function in a reasonable and predictable amount of time.  Equipment Recommendations  None recommended by OT       Precautions / Restrictions Precautions Precautions: Fall Precaution Comments: rib fractures and thorax wound; severe cervical spine disease with longstanding recommendations for rigid collar use. Restrictions Weight Bearing Restrictions: No      Mobility Bed Mobility Overal bed mobility: Modified Independent             General bed mobility comments: no physical assistance provided    Transfers Overall transfer level: Needs assistance Equipment used: None Transfers: Sit to/from Stand;Stand Pivot Transfers Sit to Stand: Supervision Stand pivot transfers: Supervision                Balance Overall balance assessment: History of Falls;Mild deficits observed, not formally tested;Needs assistance Sitting-balance support: Single extremity supported Sitting balance-Leahy Scale: Good     Standing balance support: During functional activity Standing balance-Leahy Scale: Fair Standing balance comment: close supervision                           ADL either performed or assessed with clinical judgement       Pertinent Vitals/Pain Pain Assessment: 0-10 Pain Score: 8  Pain Location: thorax surtures wound and ribs, neck  and back pain Pain Descriptors / Indicators: Aching Pain Intervention(s): Limited activity within patient's tolerance;Monitored during session;Premedicated before session     Hand Dominance Right   Extremity/Trunk  Assessment Upper Extremity Assessment Upper Extremity Assessment: Overall WFL for tasks assessed   Lower Extremity Assessment Lower Extremity Assessment: Overall WFL for tasks assessed   Cervical / Trunk Assessment Cervical / Trunk Assessment: Normal   Communication Communication Communication: No difficulties   Cognition Arousal/Alertness: Awake/alert Behavior During Therapy: WFL for tasks assessed/performed Overall Cognitive Status: History of cognitive impairments - at baseline                                 General Comments: has lewey body dementia                Home Living Family/patient expects to be discharged to:: Private residence Living Arrangements: Other relatives Available Help at Discharge: Family Type of Home: House Home Access: Ramped entrance     Home Layout: One level     Bathroom Shower/Tub: Tub/shower unit         Home Equipment: Conservation officer, nature (2 wheels);Rollator (4 wheels);Cane - single point          Prior Functioning/Environment Prior Level of Function : Independent/Modified Independent             Mobility Comments: balance difficulty at baseline, worse in past 3-6 months; reports supposed to be in a cervical hard collar when up per neurosurgeon. ADLs Comments: Independent in ADL; doesn't feel safe in bathroom; tolerates household distances at baseline.        OT Problem List: Decreased strength;Decreased activity tolerance;Impaired balance (sitting and/or standing);Decreased safety awareness      OT Treatment/Interventions: Self-care/ADL training;Therapeutic exercise;Therapeutic activities;Energy conservation;Cognitive remediation/compensation;DME and/or AE instruction;Patient/family education;Balance training;Manual therapy    OT Goals(Current goals can be found in the care plan section) Acute Rehab OT Goals Patient Stated Goal: to return home OT Goal Formulation: With patient Time For Goal Achievement:  05/28/21 Potential to Achieve Goals: Good  OT Frequency: Min 2X/week   Barriers to D/C:    none known at this time          AM-PAC OT "6 Clicks" Daily Activity     Outcome Measure Help from another person eating meals?: None Help from another person taking care of personal grooming?: None Help from another person toileting, which includes using toliet, bedpan, or urinal?: A Little Help from another person bathing (including washing, rinsing, drying)?: A Little Help from another person to put on and taking off regular upper body clothing?: None Help from another person to put on and taking off regular lower body clothing?: A Little 6 Click Score: 21   End of Session Nurse Communication: Mobility status  Activity Tolerance: Patient tolerated treatment well Patient left: in bed;with call bell/phone within reach;with bed alarm set  OT Visit Diagnosis: Unsteadiness on feet (R26.81);Repeated falls (R29.6);Muscle weakness (generalized) (M62.81)                Time: 4315-4008 OT Time Calculation (min): 27 min Charges:  OT General Charges $OT Visit: 1 Visit OT Evaluation $OT Eval Low Complexity: 1 Low OT Treatments $Self Care/Home Management : 23-37 mins  Darleen Crocker, MS, OTR/L , CBIS ascom [phone]  05/14/21, 4:10 PM

## 2021-05-14 NOTE — Evaluation (Signed)
Physical Therapy Evaluation Patient Details Name: [name] MRN: [id number] DOB: 03/31/1961 Today's Date: 05/14/2021  History of Present Illness  [name] is a 65yoF who comes to [facility] on 10/24 after tripping over and falling atop a lamp. Pt arrives with Rt lateral thorax laceration and Rt hip bruising, neck pain. Pt reports fall 2d PTA, however brother endorses fall 7d PTA- further chart review reveals a documented phone call that indicated this fall occured on 03/21/21. Imaging revealing of displaced fractures of Right ribs 7 and 8. PMH: lewey body dementia, HLD, DM, COPD, GERD, despression, GAD, dCHF, adrenal insufficiceny. 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 At time of evaluation rigid collar is not in room, but reported to be at home.  Clinical Impression  Pt admitted with above diagnosis. Pt currently with functional limitations due to the deficits listed below (see "PT Problem List"). Upon entry, pt in bed, awake and agreeable to participate. The pt is alert, pleasant, interactive, and able to provide info regarding prior level of function, both in tolerance and independence. Pt able to mobilize very near her recent baseline, however she has experienced a decline in balance and strength over the past few months and continues to be at increased risk of falls. Per pt report, compliance with assistive devices is not very good, and unclear how compliant pt has been with use of rigid cervical collar. Pt does not have good insight into her spine disease risks per her description of the situation in contrast to review of notes. Patient's performance this date reveals decreased ability, independence, and tolerance in performing all basic mobility required for performance of activities of daily living. Pt requires additional DME, close physical assistance, and cues for  safe participate in mobility. Pt will benefit from skilled PT intervention to increase independence and safety with basic mobility in preparation for discharge to the venue listed below.    MD made aware of standing cervical collar orders from outpatient and absence of collar here in hospital.       Recommendations for follow up therapy are one component of a multi-disciplinary discharge planning process, led by the attending physician.  Recommendations may be updated based on patient status, additional functional criteria and insurance authorization.  Follow Up Recommendations Home health PT    Assistance Recommended at Discharge Frequent or constant Supervision/Assistance  Functional Status Assessment Patient has had a recent decline in their functional status and demonstrates the ability to make significant improvements in function in a reasonable and predictable amount of time.  Equipment Recommendations       Recommendations for Other Services       Precautions / Restrictions Precautions Precautions: Fall Precaution Comments: rib fractures and thorax wound; severe cervical spine disease with longstanding recommendations for rigid collar use. Restrictions Weight Bearing Restrictions: No      Mobility  Bed Mobility Overal bed mobility: Modified Independent                  Transfers Overall transfer level: Needs assistance   Transfers: Sit to/from Stand Sit to Stand: Min guard           General transfer comment: easy LOB    Ambulation/Gait Ambulation/Gait assistance: Min guard Gait Distance (Feet): 120 Feet Assistive device: None   Gait velocity: 0.11m/s; HR 111bpm at rest, 117bpm while AMB   General Gait Details: no  gross gait deviations, but has intermittent LOB with gait inititation or abrupt directional changes  Stairs            Wheelchair Mobility    Modified Rankin (Stroke Patients Only)       Balance Overall balance assessment:  History of Falls;Mild deficits observed, not formally tested;Needs assistance Sitting-balance support: Single extremity supported       Standing balance support: During functional activity   Standing balance comment: unsafe to AMB without device;                             Pertinent Vitals/Pain Pain Assessment: 0-10 Pain Score: 8  Pain Location: thorax surtures wound and ribs, neck and back pain Pain Descriptors / Indicators: Aching Pain Intervention(s): Limited activity within patient's tolerance;Monitored during session;Premedicated before session    Home Living Family/patient expects to be discharged to:: Private residence Living Arrangements: Other relatives [name] comes and goes, has no place to live.) Available Help at Discharge: Family (brother) Type of Home: House Home Access: Ramped entrance       Home Layout: One level Home Equipment: Conservation officer, nature (2 wheels);Rollator (4 wheels);Cane - single point      Prior Function Prior Level of Function : Independent/Modified Independent             Mobility Comments: balance difficulty at baseline, worse in past 3-6 months; reports supposed to be in a cervical hard collar when up per neurosurgeon. ADLs Comments: Independent in ADL; doesn't feel safe in bathroom; tolerates household distances at baseline.     Hand Dominance   Dominant Hand: Right    Extremity/Trunk Assessment   Upper Extremity Assessment Upper Extremity Assessment: Overall WFL for tasks assessed    Lower Extremity Assessment Lower Extremity Assessment: Overall WFL for tasks assessed    Cervical / Trunk Assessment Cervical / Trunk Assessment: Normal  Communication   Communication: No difficulties  Cognition Arousal/Alertness: Awake/alert Behavior During Therapy: WFL for tasks assessed/performed Overall Cognitive Status: History of cognitive impairments - at baseline                                  General Comments: has lewey body dementia        General Comments      Exercises     Assessment/Plan    PT Assessment Patient needs continued PT services  PT Problem List Decreased strength;Decreased range of motion;Decreased activity tolerance;Decreased balance;Decreased mobility;Decreased coordination;Decreased cognition;Decreased knowledge of use of DME;Decreased safety awareness;Decreased knowledge of precautions;Pain       PT Treatment Interventions DME instruction;Gait training;Functional mobility training;Therapeutic activities;Therapeutic exercise;Stair training;Balance training;Cognitive remediation;Patient/family education    PT Goals (Current goals can be found in the Care Plan section)  Acute Rehab PT Goals Patient Stated Goal: get stronger, improve balance PT Goal Formulation: With patient Time For Goal Achievement: 05/28/21 Potential to Achieve Goals: Good    Frequency Min 2X/week   Barriers to discharge Decreased caregiver support      Co-evaluation               AM-PAC PT "6 Clicks" Mobility  Outcome Measure Help needed turning from your back to your side while in a flat bed without using bedrails?: A Little Help needed moving from lying on your back to sitting on the side of a flat bed without using bedrails?: A Little Help needed moving  to and from a bed to a chair (including a wheelchair)?: A Little Help needed standing up from a chair using your arms (e.g., wheelchair or bedside chair)?: A Little Help needed to walk in hospital room?: A Little Help needed climbing 3-5 steps with a railing? : A Little 6 Click Score: 18    End of Session Equipment Utilized During Treatment: Gait belt Activity Tolerance: Patient tolerated treatment well;No increased pain Patient left: in bed;with call bell/phone within reach;with chair alarm set Nurse Communication: Mobility status PT Visit Diagnosis: Unsteadiness on feet (R26.81);Difficulty in walking, not  elsewhere classified (R26.2);Muscle weakness (generalized) (M62.81);History of falling (Z91.81);Dizziness and giddiness (R42)    Time: 1610-9604 PT Time Calculation (min) (ACUTE ONLY): 24 min   Charges:   PT Evaluation $PT Eval Moderate Complexity: 1 Mod PT Treatments $Therapeutic Activity: 8-22 mins       12:15 PM, 05/14/21 Etta Grandchild, PT, DPT Physical Therapist - [facility]  [phone] ([place])    [place] C 05/14/2021, 12:11 PM

## 2021-05-15 DIAGNOSIS — S2241XB Multiple fractures of ribs, right side, initial encounter for open fracture: Secondary | ICD-10-CM | POA: Diagnosis not present

## 2021-05-15 LAB — GLUCOSE, CAPILLARY
Glucose-Capillary: 157 mg/dL — ABNORMAL HIGH (ref 70–99)
Glucose-Capillary: 187 mg/dL — ABNORMAL HIGH (ref 70–99)
Glucose-Capillary: 154 mg/dL — ABNORMAL HIGH (ref 70–99)
Glucose-Capillary: 187 mg/dL — ABNORMAL HIGH (ref 70–99)

## 2021-05-15 LAB — CBC
HCT: 37.7 % (ref 36.0–46.0)
Hemoglobin: 12.2 g/dL (ref 12.0–15.0)
MCH: 29.8 pg (ref 26.0–34.0)
MCHC: 32.4 g/dL (ref 30.0–36.0)
MCV: 92.2 fL (ref 80.0–100.0)
Platelets: 279 10*3/uL (ref 150–400)
RBC: 4.09 MIL/uL (ref 3.87–5.11)
RDW: 14 % (ref 11.5–15.5)
WBC: 19.2 10*3/uL — ABNORMAL HIGH (ref 4.0–10.5)
nRBC: 0 % (ref 0.0–0.2)

## 2021-05-15 LAB — BASIC METABOLIC PANEL
Anion gap: 9 (ref 5–15)
BUN: 28 mg/dL — ABNORMAL HIGH (ref 6–20)
CO2: 31 mmol/L (ref 22–32)
Calcium: 8.7 mg/dL — ABNORMAL LOW (ref 8.9–10.3)
Chloride: 96 mmol/L — ABNORMAL LOW (ref 98–111)
Creatinine, Ser: 0.84 mg/dL (ref 0.44–1.00)
GFR, Estimated: >60 mL/min (ref >60)
Glucose, Bld: 185 mg/dL — ABNORMAL HIGH (ref 70–99)
Potassium: 3.6 mmol/L (ref 3.5–5.1)
Sodium: 136 mmol/L (ref 135–145)

## 2021-05-15 LAB — MAGNESIUM: Magnesium: 2.3 mg/dL (ref 1.7–2.4)

## 2021-05-15 LAB — PROCALCITONIN: Procalcitonin: <0.1 ng/mL

## 2021-05-15 MED ORDER — BUTALBITAL-APAP-CAFFEINE 50-325-40 MG PO TABS
2.0000 | ORAL_TABLET | Freq: Once | ORAL | Status: AC
  Administered 2021-05-15: 2 via ORAL
  Filled 2021-05-15: qty 2

## 2021-05-15 NOTE — Plan of Care (Signed)

## 2021-05-15 NOTE — Progress Notes (Addendum)
Chaplain requested by social worker to speak with patient who was having anxiety. Lengthy visit with patient who shared some of her concerns. She was also apologetic about some of interactions and believe she was not being heard. I told her I would relay some of her concerns with Jiles Garter the social worker assigned.

## 2021-05-15 NOTE — Progress Notes (Signed)
PROGRESS NOTE    [name]  IWL:[id number] DOB: 05-Feb-1961 DOA: 05/13/2021 PCP: Townsend Roger, MD  137A/137A-AA   Assessment & Plan:   Principal Problem:   Open fracture of rib of right side_7th and 8th rib Active Problems:   GERD (gastroesophageal reflux disease)   COPD (chronic obstructive pulmonary disease) (HCC)   Adrenal insufficiency (HCC)   Chronic diastolic CHF (congestive heart failure) ([place])   Type 2 diabetes mellitus without complication ([place])   Dementia without behavioral disturbance ([place])   Tobacco abuse   Hypokalemia   Fall at home, initial encounter   Laceration of right chest wall   Depression with anxiety   [name] is a 60 y.o. female with medical history significant of dementia, diabetes mellitus, COPD, depression with anxiety, chronic pain, dCHF, melanoma, tobacco abuse, CAD, adrenal insufficiency, who presented with fall, right chest wall laceration and pain.   Patient states that she fell accidentally 2 days ago, but per brother patient actually fell 7 days ago. She states that she tripped over a lamp and fell onto it, she was lacerated by broken lamp piece of glass in her right lateral chest wall, causing severe pain over that area.  The laceration is already sutured up by ED physician.  No active bleeding.   Minimally displaced fractures of the lateral right seventh and eighth ribs --cont home oxycodone --cont oral morphine PRN --no need for IV pain meds -Incentive spirometry  Skin laceration  --over right posterior lateral chest wall --sutured by ED provider --started on ppx Ancef on admission.  Currently no signs of infection.  WBC increased this morning, could be reactive. Plan: --cont Ancef for now --monitor for WBC  GERD (gastroesophageal reflux disease) -Protonix   COPD (chronic obstructive pulmonary disease) ([place]): Stable --cont home bronchodilators   Adrenal insufficiency [facility]):  Patient still taking 15 mg of prednisone at  home.  She is reportedly allergic to Solu-Cortef -started on prednisone 50 mg every 6 hours on admission Plan: --cont home prednisone 15 mg daily   Chronic diastolic CHF (congestive heart failure) ([place]): 2D echo on 08/09/2014 showed EF > 55%, patient has trace leg edema, no respiratory distress, does not seem to have CHF exacerbation -Continue home Lasix 80 mg daily   Type 2 diabetes mellitus without complication ([place]), well controlled Hyperglycemia 2/2 steroid use Recent A1c 5.3, well controlled.  Patient taking glipizide at home --SSI ACHS for now   Dementia without behavioral disturbance ([place]) -Continue home donepezil   Tobacco abuse --declined nicotine patch   Fall -Fall precaution -PT   Hypokalemia:  --monitor and replete PRN   Depression with anxiety --cont home Xanax 1 mg QID PRN  Chronic pain on chronic opioids --cont home percocet 10, q4h PRN  Diffuse advanced degenerative cervical disc disease and facet disease Grade 1-2 anterolisthesis at multiple levels  --followed by ortho Dr. Macario Carls at [facility], who had referred pt to neurosurgery for surgical fixation.  Pt reported having seen a neurosurgeon who refused to operate on her Plan: --neurosurgery consult today   DVT prophylaxis: SCD/Compression stockings Code Status: DNR  Family Communication:  Level of care: Med-Surg Dispo:   The patient is from: home Anticipated d/c is to: home Anticipated d/c date is: likely tomorrow Patient currently is not medically ready to d/c.  Due to worsening leukocytosis.     Subjective and Interval History:  Pt complained of headache, and believed it's from oral morphine (though IV morphine was ok).    RN  reported pt has been using incentive spirometry.     Objective: Vitals:   05/14/21 2040 05/15/21 0447 05/15/21 0730 05/15/21 1140  BP: 127/74 (!) 110/44 128/72 113/72  Pulse: 98 98 87 92  Resp: 16 17 18 18   Temp: 98 F (36.7 C) 97.9 F (36.6 C) 98.2 F (36.8 C) 98.3  F (36.8 C)  TempSrc:    Oral  SpO2: 100% 98% 100% 97%  Weight:      Height:        Intake/Output Summary (Last 24 hours) at 05/15/2021 1842 Last data filed at 05/14/2021 1851 Gross per 24 hour  Intake 240 ml  Output --  Net 240 ml   Filed Weights   05/12/21 1941  Weight: 68 kg    Examination:   Constitutional: NAD, AAOx3 HEENT: conjunctivae and lids normal, EOMI CV: No cyanosis.   RESP: normal respiratory effort, on RA Neuro: II - XII grossly intact.   Psych: Normal mood and affect.     Data Reviewed: I have personally reviewed following labs and imaging studies  CBC: Recent Labs  Lab 05/12/21 1946 05/15/21 0443  WBC 10.2 19.2*  NEUTROABS 5.2  --   HGB 13.4 12.2  HCT 39.0 37.7  MCV 91.5 92.2  PLT 310 073   Basic Metabolic Panel: Recent Labs  Lab 05/12/21 1946 05/13/21 0014 05/14/21 0120 05/15/21 0443  NA 134*  --  134* 136  K 3.3*  --  3.6 3.6  CL 88*  --  94* 96*  CO2 32  --  29 31  GLUCOSE 106*  --  238* 185*  BUN 27*  --  24* 28*  CREATININE 1.08*  --  1.33* 0.84  CALCIUM 9.1  --  9.4 8.7*  MG  --  1.8  --  2.3   GFR: Estimated Creatinine Clearance: 72.7 mL/min (by C-G formula based on SCr of 0.84 mg/dL). Liver Function Tests: Recent Labs  Lab 05/12/21 1946  AST 23  ALT 31  ALKPHOS 79  BILITOT 0.8  PROT 7.5  ALBUMIN 4.1   No results for input(s): LIPASE, AMYLASE in the last 168 hours. No results for input(s): AMMONIA in the last 168 hours. Coagulation Profile: Recent Labs  Lab 05/13/21 0755  INR 0.9   Cardiac Enzymes: No results for input(s): CKTOTAL, CKMB, CKMBINDEX, TROPONINI in the last 168 hours. BNP (last 3 results) No results for input(s): PROBNP in the last 8760 hours. HbA1C: No results for input(s): HGBA1C in the last 72 hours. CBG: Recent Labs  Lab 05/14/21 1655 05/14/21 2042 05/15/21 0730 05/15/21 1208 05/15/21 1655  GLUCAP 223* 167* 157* 187* 154*   Lipid Profile: No results for input(s): CHOL, HDL,  LDLCALC, TRIG, CHOLHDL, LDLDIRECT in the last 72 hours. Thyroid Function Tests: No results for input(s): TSH, T4TOTAL, FREET4, T3FREE, THYROIDAB in the last 72 hours. Anemia Panel: No results for input(s): VITAMINB12, FOLATE, FERRITIN, TIBC, IRON, RETICCTPCT in the last 72 hours. Sepsis Labs: Recent Labs  Lab 05/15/21 0443  PROCALCITON <0.10    Recent Results (from the past 240 hour(s))  Resp Panel by RT-PCR (Flu A&B, Covid) Nasopharyngeal Swab     Status: None   Collection Time: 05/13/21  7:44 AM   Specimen: Nasopharyngeal Swab; Nasopharyngeal(NP) swabs in vial transport medium  Result Value Ref Range Status   SARS Coronavirus 2 by RT PCR NEGATIVE NEGATIVE Final    Comment: (NOTE) SARS-CoV-2 target nucleic acids are NOT DETECTED.  The SARS-CoV-2 RNA is generally detectable in upper  respiratory specimens during the acute phase of infection. The lowest concentration of SARS-CoV-2 viral copies this assay can detect is 138 copies/mL. A negative result does not preclude SARS-Cov-2 infection and should not be used as the sole basis for treatment or other patient management decisions. A negative result may occur with  improper specimen collection/handling, submission of specimen other than nasopharyngeal swab, presence of viral mutation(s) within the areas targeted by this assay, and inadequate number of viral copies(<138 copies/mL). A negative result must be combined with clinical observations, patient history, and epidemiological information. The expected result is Negative.  Fact Sheet for Patients:  EntrepreneurPulse.com.au  Fact Sheet for Healthcare Providers:  IncredibleEmployment.be  This test is no t yet approved or cleared by the [place] FDA and  has been authorized for detection and/or diagnosis of SARS-CoV-2 by FDA under an Emergency Use Authorization (EUA). This EUA will remain  in effect (meaning this test can be used) for the  duration of the COVID-19 declaration under Section 564(b)(1) of the Act, 21 U.S.C.section 360bbb-3(b)(1), unless the authorization is terminated  or revoked sooner.       Influenza A by PCR NEGATIVE NEGATIVE Final   Influenza B by PCR NEGATIVE NEGATIVE Final    Comment: (NOTE) The Xpert Xpress SARS-CoV-2/FLU/RSV plus assay is intended as an aid in the diagnosis of influenza from Nasopharyngeal swab specimens and should not be used as a sole basis for treatment. Nasal washings and aspirates are unacceptable for Xpert Xpress SARS-CoV-2/FLU/RSV testing.  Fact Sheet for Patients: EntrepreneurPulse.com.au  Fact Sheet for Healthcare Providers: IncredibleEmployment.be  This test is not yet approved or cleared by the [place] FDA and has been authorized for detection and/or diagnosis of SARS-CoV-2 by FDA under an Emergency Use Authorization (EUA). This EUA will remain in effect (meaning this test can be used) for the duration of the COVID-19 declaration under Section 564(b)(1) of the Act, 21 U.S.C. section 360bbb-3(b)(1), unless the authorization is terminated or revoked.  Performed at [facility], [address], [place], [place] [address]       Radiology Studies: No results found.   Scheduled Meds:  calcium-vitamin D  1 tablet Oral Daily   donepezil  10 mg Oral QHS   fluticasone  2 spray Each Nare Daily   furosemide  80 mg Oral Daily   gabapentin  600 mg Oral TID   insulin aspart  0-5 Units Subcutaneous QHS   insulin aspart  0-9 Units Subcutaneous TID WC   magnesium oxide  400 mg Oral Daily   mometasone-formoterol  2 puff Inhalation BID   pantoprazole  40 mg Oral Daily   predniSONE  15 mg Oral Q breakfast   traZODone  100 mg Oral QHS   Continuous Infusions:   ceFAZolin (ANCEF) IV 2 g (05/15/21 1458)     LOS: 2 days     Enzo Bi, MD [facility] If 7PM-7AM, please contact night-coverage 05/15/2021, 6:42  PM

## 2021-05-15 NOTE — Progress Notes (Addendum)
Patient reported been upset about so many things this AM,that do y'all want to kick me out. This nurse educated patient about IDT and TOC team and she's not been kicked out if there was any mis-communication about her discharge. Patient was reassured that any complaints will be addressed about dc planning and MD will see her during rounds. Patient was administered prn percocet at the beginning of shift for pain.Patient educated on relaxation techniques and prn xanax was given at this time for anxiety.will cont to monitor.

## 2021-05-15 NOTE — Consult Note (Signed)
Neurosurgery-New Consultation Evaluation 05/15/2021 [name] [id number]  Identifying Statement: [name] is a 60 y.o. female from [place] [address] with medical history of GERD, COPD, adrenal insufficiency, chronic diastolic congestive heart failure, type 2 diabetes, Lewy body dementia, tobacco abuse, CAD, and anxiety and depression.    Physician Requesting Consultation: No ref. provider found  History of Present Illness: [name] is a 60 y.o who presented to the ER on 10/24 after mechanical fall at home roughly a week prior.  Per the patient she was moving some boxes and tripped over a lamp causing a laceration to her right lateral chest and underlying rib fractures.  She has been hospitalized since. Unrelated to her presenting problem, the patient has a longstanding history of severe cervical disease.  She had been following with Dr. Macario Carls at [facility] who had offered her an extensive surgical procedure for this however the patient was subsequently lost to follow-up. Today she reports a longstanding history of neck pain that is worse at any movement of her neck as well as radiating pain down her bilateral arms worse on the left than the right and numbness into her hands bilaterally.  She also endorses intermittent difficulty with holding objects and will frequently drop or break glasses.  She also endorses difficulty swallowing and hoarse voice which has been present since her second anterior cervical surgery years ago however she feels as though her swallowing is getting worse over time.  While she did present with a fall, patient denies any increase in balance or over the last 6 months.  She does ambulate with a cane or a walker but attributes this to persistent left hip pain after hip replacement.  Past Medical History:  Past Medical History:  Diagnosis Date   Adrenal insufficiency (HCC)    Anxiety    CAD (coronary artery disease)    Cancer (HCC)    skin   Chronic diastolic  CHF (congestive heart failure) (HCC)    Chronic pain    COPD (chronic obstructive pulmonary disease) (HCC)    Cushing disease (HCC)    Dementia (HCC)    GERD (gastroesophageal reflux disease)    HLD (hyperlipidemia)     Social History: Social History   Socioeconomic History   Marital status: Single    Spouse name: Not on file   Number of children: Not on file   Years of education: Not on file   Highest education level: Not on file  Occupational History   Not on file  Tobacco Use   Smoking status: Every Day    Packs/day: 0.50    Types: Cigarettes   Smokeless tobacco: Never  Vaping Use   Vaping Use: Never used  Substance and Sexual Activity   Alcohol use: Yes    Alcohol/week: 0.0 standard drinks    Comment: rarely   Drug use: No   Sexual activity: Not on file  Other Topics Concern   Not on file  Social History Narrative   Not on file   Social Determinants of Health   Financial Resource Strain: Not on file  Food Insecurity: Not on file  Transportation Needs: Not on file  Physical Activity: Not on file  Stress: Not on file  Social Connections: Not on file  Intimate Partner Violence: Not on file   Living arrangements (living alone, with partner): lives alone   Family History: Family History  Problem Relation Age of Onset   Ovarian cancer Mother    Breast cancer Mother  Brain cancer Mother    CAD Father    Diabetes Father    Hepatitis Father    Liver disease Father    Cancer Father    Alcohol abuse Father    Heart attack Brother    Sleep apnea Brother    Diabetes Brother    ALS Paternal Grandmother    Stroke Maternal Grandmother     Review of Systems:  Review of Systems - General ROS: Negative Psychological ROS: Negative Ophthalmic ROS: Negative ENT ROS: Negative Hematological and Lymphatic ROS: Negative  Endocrine ROS: Negative Respiratory ROS: Negative Cardiovascular ROS: Negative Gastrointestinal ROS: Negative Genito-Urinary ROS:  Negative Musculoskeletal ROS: Negative Neurological ROS: Negative Dermatological ROS: Negative  Physical Exam: BP 113/72 (BP Location: Left Arm)   Pulse 92   Temp 98.3 F (36.8 C) (Oral)   Resp 18   Ht 5\' 8"  (1.727 m)   Wt 68 kg   SpO2 97%   BMI 22.81 kg/m  Body mass index is 22.81 kg/m. Body surface area is 1.81 meters squared. General appearance: Alert, cooperative, in no acute distress Head: Normocephalic, atraumatic Eyes: Normal, EOM intact Oropharynx: Moist without lesions Neck: Supple, obvious dorsal cervical fat pad Abdomen: Soft, nondistended Ext: No edema in LE bilaterally, good distal pulses  Neurologic exam:  Mental status: alertness: alert, orientation: person, place, time, affect: normal Speech: fluent and clear with mild hoarseness Cranial nerves:  II-XII grossly intact Motor:strength symmetric 5/5, normal muscle mass and tone in all extremities except 4-/5 bilateral triceps Sensory: Decreased sensation to light touch throughout Reflexes: 2+ patellar and brachioradialis.  No Hoffmann's or clonus Coordination: intact finger to nose Gait: untested  Laboratory: Results for orders placed or performed during the hospital encounter of 05/13/21  Resp Panel by RT-PCR (Flu A&B, Covid) Nasopharyngeal Swab   Specimen: Nasopharyngeal Swab; Nasopharyngeal(NP) swabs in vial transport medium  Result Value Ref Range   SARS Coronavirus 2 by RT PCR NEGATIVE NEGATIVE   Influenza A by PCR NEGATIVE NEGATIVE   Influenza B by PCR NEGATIVE NEGATIVE  Comprehensive metabolic panel  Result Value Ref Range   Sodium 134 (L) 135 - 145 mmol/L   Potassium 3.3 (L) 3.5 - 5.1 mmol/L   Chloride 88 (L) 98 - 111 mmol/L   CO2 32 22 - 32 mmol/L   Glucose, Bld 106 (H) 70 - 99 mg/dL   BUN 27 (H) 6 - 20 mg/dL   Creatinine, Ser 1.08 (H) 0.44 - 1.00 mg/dL   Calcium 9.1 8.9 - 10.3 mg/dL   Total Protein 7.5 6.5 - 8.1 g/dL   Albumin 4.1 3.5 - 5.0 g/dL   AST 23 15 - 41 U/L   ALT 31 0 - 44 U/L    Alkaline Phosphatase 79 38 - 126 U/L   Total Bilirubin 0.8 0.3 - 1.2 mg/dL   GFR, Estimated 59 (L) >60 mL/min   Anion gap 14 5 - 15  CBC with Differential  Result Value Ref Range   WBC 10.2 4.0 - 10.5 K/uL   RBC 4.26 3.87 - 5.11 MIL/uL   Hemoglobin 13.4 12.0 - 15.0 g/dL   HCT 39.0 36.0 - 46.0 %   MCV 91.5 80.0 - 100.0 fL   MCH 31.5 26.0 - 34.0 pg   MCHC 34.4 30.0 - 36.0 g/dL   RDW 14.2 11.5 - 15.5 %   Platelets 310 150 - 400 K/uL   nRBC 0.0 0.0 - 0.2 %   Neutrophils Relative % 50 %   Neutro Abs 5.2 1.7 -  7.7 K/uL   Lymphocytes Relative 38 %   Lymphs Abs 3.9 0.7 - 4.0 K/uL   Monocytes Relative 8 %   Monocytes Absolute 0.8 0.1 - 1.0 K/uL   Eosinophils Relative 2 %   Eosinophils Absolute 0.2 0.0 - 0.5 K/uL   Basophils Relative 1 %   Basophils Absolute 0.1 0.0 - 0.1 K/uL   Immature Granulocytes 1 %   Abs Immature Granulocytes 0.07 0.00 - 0.07 K/uL  Magnesium  Result Value Ref Range   Magnesium 1.8 1.7 - 2.4 mg/dL  Protime-INR  Result Value Ref Range   Prothrombin Time 12.3 11.4 - 15.2 seconds   INR 0.9 0.8 - 1.2  Brain natriuretic peptide  Result Value Ref Range   B Natriuretic Peptide 22.9 0.0 - 100.0 pg/mL  Glucose, capillary  Result Value Ref Range   Glucose-Capillary 235 (H) 70 - 99 mg/dL  Basic metabolic panel  Result Value Ref Range   Sodium 134 (L) 135 - 145 mmol/L   Potassium 3.6 3.5 - 5.1 mmol/L   Chloride 94 (L) 98 - 111 mmol/L   CO2 29 22 - 32 mmol/L   Glucose, Bld 238 (H) 70 - 99 mg/dL   BUN 24 (H) 6 - 20 mg/dL   Creatinine, Ser 1.33 (H) 0.44 - 1.00 mg/dL   Calcium 9.4 8.9 - 10.3 mg/dL   GFR, Estimated 46 (L) >60 mL/min   Anion gap 11 5 - 15  HIV Antibody (routine testing w rflx)  Result Value Ref Range   HIV Screen 4th Generation wRfx Non Reactive Non Reactive  Glucose, capillary  Result Value Ref Range   Glucose-Capillary 191 (H) 70 - 99 mg/dL  Glucose, capillary  Result Value Ref Range   Glucose-Capillary 237 (H) 70 - 99 mg/dL  Glucose,  capillary  Result Value Ref Range   Glucose-Capillary 223 (H) 70 - 99 mg/dL  Basic metabolic panel  Result Value Ref Range   Sodium 136 135 - 145 mmol/L   Potassium 3.6 3.5 - 5.1 mmol/L   Chloride 96 (L) 98 - 111 mmol/L   CO2 31 22 - 32 mmol/L   Glucose, Bld 185 (H) 70 - 99 mg/dL   BUN 28 (H) 6 - 20 mg/dL   Creatinine, Ser 0.84 0.44 - 1.00 mg/dL   Calcium 8.7 (L) 8.9 - 10.3 mg/dL   GFR, Estimated >60 >60 mL/min   Anion gap 9 5 - 15  CBC  Result Value Ref Range   WBC 19.2 (H) 4.0 - 10.5 K/uL   RBC 4.09 3.87 - 5.11 MIL/uL   Hemoglobin 12.2 12.0 - 15.0 g/dL   HCT 37.7 36.0 - 46.0 %   MCV 92.2 80.0 - 100.0 fL   MCH 29.8 26.0 - 34.0 pg   MCHC 32.4 30.0 - 36.0 g/dL   RDW 14.0 11.5 - 15.5 %   Platelets 279 150 - 400 K/uL   nRBC 0.0 0.0 - 0.2 %  Magnesium  Result Value Ref Range   Magnesium 2.3 1.7 - 2.4 mg/dL  Glucose, capillary  Result Value Ref Range   Glucose-Capillary 167 (H) 70 - 99 mg/dL   Comment 1 Notify RN   Glucose, capillary  Result Value Ref Range   Glucose-Capillary 157 (H) 70 - 99 mg/dL   Comment 1 Notify RN    Comment 2 Document in Chart   Procalcitonin - Baseline  Result Value Ref Range   Procalcitonin <0.10 ng/mL  Glucose, capillary  Result Value Ref Range  Glucose-Capillary 187 (H) 70 - 99 mg/dL  CBG monitoring, ED  Result Value Ref Range   Glucose-Capillary 134 (H) 70 - 99 mg/dL  CBG monitoring, ED  Result Value Ref Range   Glucose-Capillary 185 (H) 70 - 99 mg/dL  CBG monitoring, ED  Result Value Ref Range   Glucose-Capillary 101 (H) 70 - 99 mg/dL  Troponin I (High Sensitivity)  Result Value Ref Range   Troponin I (High Sensitivity) 7 <18 ng/L  Troponin I (High Sensitivity)  Result Value Ref Range   Troponin I (High Sensitivity) 9 <18 ng/L   I personally reviewed labs  Imaging:  I personally reviewed radiology studies to include:  CT c-spine  FINDINGS: Alignment: 4 mm of anterolisthesis of C5 on C6. 5 mm of anterolisthesis of C6 on  C7. 3 mm of anterolisthesis of C7 on T1. Findings are stable. These are related to diffuse advanced degenerative facet disease.   Skull base and vertebrae: No visible fracture. No focal bone lesion.   Soft tissues and spinal canal: No prevertebral fluid or swelling. No visible canal hematoma.  MRI C-spine MRI IMPRESSION: 1. Multilevel cervical spondylosis of the cervical spine greatest at the C3-4 level where there is severe canal stenosis and compression of the cervical cord. No intrinsic cord signal abnormality. 2. Mild-to-moderate canal stenosis with severe bilateral foraminal stenosis at C5-6 through C7-T1. 3. Severe left foraminal stenosis at C3-4. 4. Reactive marrow edema associated with the right C3-4 facet joint, which may be a focal source of pain. 5. Degenerative anterolisthesis of C5-6, C6-7, and C7-T1.   Electronically Signed   By: Davina Poke D.O.   On: 10/15/2020 11:50  Impression/Plan:  [name] is a very pleasant 60 year old with multiple comorbidities presenting after a fall resulting in a right flank laceration and rib fractures.  She has a longstanding history of severe cervical disease for which neurosurgery was consulted to establish outpatient plan of care.   1.  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  Plan - pt requesting soft cervical collar for comfort. - f/u with Dr. Izora Ribas outpatient in 2-3 weeks. Our office will arrange this. - no plan for acute neurosurgical intervention at this time.  Cooper Render PA-C Neurosurgery

## 2021-05-15 NOTE — TOC Initial Note (Signed)
Transition of Care [facility]) - Initial/Assessment Note    Patient Details  Name: [name] MRN: [id number] Date of Birth: 07/21/1960  [facility] CM/SW Contact:    Pete Pelt, RN Phone Number: 05/15/2021, 9:54 AM  Clinical Narrative:  Patient is agitated this AM, stating she does not want to see her hospitalist and that she is not having a good experience with her nurse and the staff beyond ER.  Charge nurse made aware.  Patient experience made aware by TOC as well.  Patient has a known history of dementia and could not answer whether she lives alone.  She referred to [facility] as someone that stays at her house at times.    RNCM spoke to [place] who stated he is staying at her house and has been driving her to appointments, and will continue to support patient with any needs at home.  Ronalee Belts states patient gets confused at times.  He states he will be in today to see patient and speak with her.    Both patient and she are comfortable with patient returning home at discharge with [place] and detailed instructions for care.  [facility] cannot accept patient back for Home Health, but [facility] notified.  TOC to follow to discharge.   Expected Discharge Plan: [place] Barriers to Discharge: Continued Medical Work up   Patient Goals and CMS Choice     Choice offered to / list presented to : NA  Expected Discharge Plan and Services Expected Discharge Plan: [place]   Discharge Planning Services: CM Consult Post Acute Care Choice: [place] arrangements for the past 2 months: Single Family Home                           HH Arranged:  (agency tbd)          Prior Living Arrangements/Services Living arrangements for the past 2 months: Single Family Home Lives with:: Self, Relatives Patient language and need for interpreter reviewed:: Yes (no interpreter required) Do you feel safe going back to the place where  you live?: Yes      Need for Family Participation in Patient Care: Yes (Comment) Care giver support system in place?: Yes (comment) Current home services: Home OT, Home PT, Home RN Criminal Activity/Legal Involvement Pertinent to Current Situation/Hospitalization: No - Comment as needed  Activities of Daily Living Home Assistive Devices/Equipment: None ADL Screening (condition at time of admission) Patient's cognitive ability adequate to safely complete daily activities?: Yes Is the patient deaf or have difficulty hearing?: Yes Does the patient have difficulty seeing, even when wearing glasses/contacts?: No Does the patient have difficulty concentrating, remembering, or making decisions?: No Patient able to express need for assistance with ADLs?: Yes Does the patient have difficulty dressing or bathing?: No Independently performs ADLs?: Yes (appropriate for developmental age) Does the patient have difficulty walking or climbing stairs?: Yes Weakness of Legs: Both Weakness of Arms/Hands: None  Permission Sought/Granted   Permission granted to share information with : Yes, Verbal Permission Granted     Permission granted to share info w AGENCY: home health agency        Emotional Assessment Appearance:: Appears stated age Attitude/Demeanor/Rapport: Apprehensive Affect (typically observed): Agitated Orientation: : Oriented to Self, Oriented to Place, Oriented to  Time Alcohol / Substance Use: Not Applicable Psych Involvement: No (comment)  Admission diagnosis:  Trauma [T14.90XA] Fall [W19.XXXA] Open  fracture of rib of right side [S22.31XB] Open fracture of multiple ribs of right side, initial encounter [S22.41XB] Patient Active Problem List   Diagnosis Date Noted   Fall 05/13/2021   Hypokalemia 05/13/2021   CAD (coronary artery disease)    Fall at home, initial encounter    Laceration of right chest wall    Open fracture of rib of right side_7th and 8th rib    Depression  with anxiety    Dislocation of internal left hip prosthesis ([place]) 12/06/2019   Preoperative clearance 12/06/2019   Accidental fall 12/06/2019   Scalp laceration, initial encounter 12/06/2019   Type 2 diabetes mellitus without complication ([place]) 27/01/8674   Dementia without behavioral disturbance ([place]) 12/06/2019   S/P hip replacement, left 12/06/2019   Injury of conjunctiva and corneal abrasion of right eye without foreign body 11/19/2019   Intractable nausea and vomiting 09/12/2019   Vertigo 09/12/2019   Chronic diastolic CHF (congestive heart failure) ([place]) 09/12/2019   Current chronic use of systemic steroids 09/12/2019   AKI (acute kidney injury) ([place]) 09/12/2019   Hypokalemia due to excessive gastrointestinal loss of potassium 09/12/2019   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 03/28/2019   Cervical disc disorder at C4-C5 level with radiculopathy 03/28/2019   Hallucinations, visual 10/19/2018   Tobacco abuse 03/08/2018   Iron deficiency 11/17/2017   Abdominal pain 09/07/2017   Diarrhea    Secondary adrenal insufficiency ([place]) 07/26/2017   Flexor tenosynovitis of finger 05/15/2017   Sepsis ([place]) 05/15/2017   Early onset Alzheimer's dementia without behavioral disturbance ([place]) 02/16/2017   Low back pain 02/09/2017   Radiculopathy of lumbosacral region 01/07/2017   Neuropathy 11/16/2016   Disturbance of sleep 10/28/2016   Pharyngeal dysphagia 09/24/2016   Ulcer of left lower extremity with fat layer exposed ([place]) 09/24/2016   Venous insufficiency 09/24/2016   Sensorineural hearing loss (SNHL) of both ears 08/20/2016   Sinusitis chronic, ethmoidal 08/20/2016   At high risk for falls 07/21/2016   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   History of fall within past 90 days 07/21/2016   Osteopenia 07/21/2016   Polyneuropathy, idiopathic progressive 07/21/2016   Sleep walking 07/21/2016   Malignant melanoma of lower leg, left ([place]) 11/05/2015   Chest pain 06/17/2015    Elevated troponin 06/17/2015   Fracture, ribs 06/17/2015   GERD (gastroesophageal reflux disease) 06/17/2015   COPD (chronic obstructive pulmonary disease) ([place]) 06/17/2015   Chronic pain 06/17/2015   Anxiety 06/17/2015   Adrenal insufficiency ([place]) 06/17/2015   Allergic rhinitis due to allergen 10/02/2011   PCP:  Townsend Roger, MD Pharmacy:   [facility] [place] [address] [place] [address] Phone: [phone] Fax: [phone]     Social Determinants of Health (SDOH) Interventions    Readmission Risk Interventions No flowsheet data found.

## 2021-05-16 DIAGNOSIS — S2241XB Multiple fractures of ribs, right side, initial encounter for open fracture: Secondary | ICD-10-CM | POA: Diagnosis not present

## 2021-05-16 LAB — MAGNESIUM: Magnesium: 2.1 mg/dL (ref 1.7–2.4)

## 2021-05-16 LAB — CBC
HCT: 39.4 % (ref 36.0–46.0)
Hemoglobin: 13.2 g/dL (ref 12.0–15.0)
MCH: 31.4 pg (ref 26.0–34.0)
MCHC: 33.5 g/dL (ref 30.0–36.0)
MCV: 93.6 fL (ref 80.0–100.0)
Platelets: 258 10*3/uL (ref 150–400)
RBC: 4.21 MIL/uL (ref 3.87–5.11)
RDW: 14.5 % (ref 11.5–15.5)
WBC: 10.4 10*3/uL (ref 4.0–10.5)
nRBC: 0 % (ref 0.0–0.2)

## 2021-05-16 LAB — BASIC METABOLIC PANEL
Anion gap: 7 (ref 5–15)
BUN: 30 mg/dL — ABNORMAL HIGH (ref 6–20)
CO2: 39 mmol/L — ABNORMAL HIGH (ref 22–32)
Calcium: 8.6 mg/dL — ABNORMAL LOW (ref 8.9–10.3)
Chloride: 89 mmol/L — ABNORMAL LOW (ref 98–111)
Creatinine, Ser: 0.85 mg/dL (ref 0.44–1.00)
GFR, Estimated: >60 mL/min (ref >60)
Glucose, Bld: 108 mg/dL — ABNORMAL HIGH (ref 70–99)
Potassium: 3.3 mmol/L — ABNORMAL LOW (ref 3.5–5.1)
Sodium: 135 mmol/L (ref 135–145)

## 2021-05-16 LAB — GLUCOSE, CAPILLARY: Glucose-Capillary: 107 mg/dL — ABNORMAL HIGH (ref 70–99)

## 2021-05-16 MED ORDER — BUTALBITAL-APAP-CAFFEINE 50-325-40 MG PO TABS
2.0000 | ORAL_TABLET | Freq: Once | ORAL | Status: AC
  Administered 2021-05-16: 2 via ORAL
  Filled 2021-05-16: qty 2

## 2021-05-16 MED ORDER — ALPRAZOLAM 1 MG PO TABS: 1.0000 mg | ORAL_TABLET | Freq: Four times a day (QID) | ORAL | 0 refills | Status: DC | PRN

## 2021-05-16 MED ORDER — MORPHINE SULFATE 15 MG PO TABS: 15.0000 mg | ORAL_TABLET | Freq: Four times a day (QID) | ORAL | 0 refills | Status: DC | PRN

## 2021-05-16 MED ORDER — MORPHINE SULFATE 15 MG PO TABS: 15.0000 mg | ORAL_TABLET | Freq: Four times a day (QID) | ORAL | 0 refills | Status: AC | PRN

## 2021-05-16 MED ORDER — POTASSIUM CHLORIDE CRYS ER 20 MEQ PO TBCR
40.0000 meq | EXTENDED_RELEASE_TABLET | Freq: Once | ORAL | Status: AC
  Administered 2021-05-16: 40 meq via ORAL
  Filled 2021-05-16: qty 2

## 2021-05-16 MED ORDER — BUTALBITAL-APAP-CAFFEINE 50-325-40 MG PO TABS: 2.0000 | ORAL_TABLET | Freq: Every day | ORAL | 0 refills | Status: DC | PRN

## 2021-05-16 NOTE — TOC Progression Note (Signed)
Transition of Care [facility]) - Progression Note    Patient Details  Name: [name] MRN: [id number] Date of Birth: [birth year]  [facility] CM/SW [place], RN Phone Number: 05/16/2021, 10:33 AM  Clinical Narrative:   Patient can discharge today as per MD, [facility] home health for RN, PT, OT confirmed by Malachy Mood at [place].     Expected Discharge Plan: [place] Barriers to Discharge: Continued Medical Work up  Expected Discharge Plan and Services Expected Discharge Plan: [place]   Discharge Planning Services: CM Consult Post Acute Care Choice: [place] arrangements for the past 2 months: Single Family Home Expected Discharge Date: 05/16/21                         HH Arranged:  (agency tbd)           Social Determinants of Health (SDOH) Interventions    Readmission Risk Interventions No flowsheet data found.

## 2021-05-16 NOTE — Discharge Summary (Signed)
Physician Discharge Summary   [name]  female DOB: [birth year]  TLX:[id number]  PCP: Townsend Roger, MD  Admit date: 05/13/2021 Discharge date: 05/16/2021  Admitted From: home Disposition:  home Home Health: Yes CODE STATUS: DNR  Discharge Instructions     Discharge wound care:   Complete by: As directed    Please follow up with PCP for suture removal. [facility] Course:  For full details, please see H&P, progress notes, consult notes and ancillary notes.  Briefly,  [name] is a 60 y.o. female with medical history significant of dementia, diabetes mellitus, COPD, depression with anxiety, chronic pain, dCHF, melanoma, tobacco abuse, CAD, adrenal insufficiency, who presented with fall, right chest wall laceration and pain.   Patient states that she fell accidentally 2 days ago, but per brother reported patient actually fell 7 days ago. She stated that she tripped over a lamp and fell onto it, she was lacerated by broken lamp piece of glass in her right lateral chest wall, causing severe pain over that area.  The laceration was already sutured up by ED physician.  No active bleeding.   Minimally displaced fractures of the lateral right seventh and eighth ribs --cont home oxycodone --gave extra oral morphine PRN for 3 days after discharge. --encouraged to use Incentive spirometry   Skin laceration  --over right posterior lateral chest wall --sutured by ED provider --started on ppx Ancef on admission for ppx.  Currently no signs of infection.   --Pt will follow up with PCP for suture removal.   GERD (gastroesophageal reflux disease) -Protonix   COPD (chronic obstructive pulmonary disease) ([place]): Stable --cont home bronchodilators   Adrenal insufficiency [facility]):  Patient still taking 15 mg of prednisone at home.  She is reportedly allergic to Solu-Cortef -started on prednisone 50 mg every 6 hours on admission.  Resumed home dose of 15 mg daily on  10/27.   Chronic diastolic CHF (congestive heart failure) ([place]):  2D echo on 08/09/2014 showed EF > 55%, no respiratory distress, did not seem to have CHF exacerbation -Continue home Lasix 80 mg daily   Type 2 diabetes mellitus without complication ([place]), well controlled Hyperglycemia 2/2 steroid use Recent A1c 5.3, well controlled.  Glipizide listed as home med, but pt has not been taking it.  Glipizide was d/c'ed at discharge since pt has no need.   Dementia without behavioral disturbance ([place]) -Continue home donepezil   Tobacco abuse --declined nicotine patch   Fall -Fall precaution -PT   Hypokalemia:  --monitor and replete PRN --cont home potassium supplement  Depression with anxiety --cont home Xanax 1 mg QID PRN --Wellbutrin d/c'ed since pt no longer takes it.   Chronic pain on chronic opioids --cont home percocet 10, q4h PRN   Diffuse advanced degenerative cervical disc disease and facet disease Grade 1-2 anterolisthesis at multiple levels  --followed by ortho Dr. Macario Carls at [facility], who had referred pt to neurosurgery for surgical fixation.  Pt reported having seen a neurosurgeon who refused to operate on her --neurosurgery consulted, and Dr. Izora Ribas will see pt in clinic 2-3 weeks after discharge.  Headache --ordered Fioricet PRN    Discharge Diagnoses:  Principal Problem:   Open fracture of rib of right side_7th and 8th rib Active Problems:   GERD (gastroesophageal reflux disease)   COPD (chronic obstructive pulmonary disease) (HCC)   Adrenal insufficiency (HCC)   Chronic diastolic CHF (congestive heart failure) (HCC)   Type  2 diabetes mellitus without complication ([place])   Dementia without behavioral disturbance ([place])   Tobacco abuse   Hypokalemia   Fall at home, initial encounter   Laceration of right chest wall   Depression with anxiety   30 Day Unplanned Readmission Risk Score    Flowsheet Row ED to Hosp-Admission (Current) from 05/13/2021 in  [place] (1A)  30 Day Unplanned Readmission Risk Score (%) 27.15 Filed at 05/16/2021 0801       This score is the patient's risk of an unplanned readmission within 30 days of being discharged (0 -100%). The score is based on dignosis, age, lab data, medications, orders, and past utilization.   Low:  0-14.9   Medium: 15-21.9   High: 22-29.9   Extreme: 30 and above         Discharge Instructions:  Allergies as of 05/16/2021       Reactions   Bupropion Other (See Comments), Nausea Only, Shortness Of Breath, Nausea And Vomiting   cold sweats, extended release, only can take Watson brand Issues with extended release medications. Other reaction(s): Unknown ER- cold sweats, extended release, only can take Watson brand Issues with extended release medications.   Levaquin [levofloxacin In D5w] Anaphylaxis   Methylprednisolone Acetate Other (See Comments)   Amoxicillin Itching   Codeine Nausea And Vomiting   Cymbalta [duloxetine Hcl] Swelling   Gatifloxacin Hives, Swelling   Other Reaction: Other reaction: Swelling   Guaifenesin & Derivatives Hives, Swelling   Keflex [cephalexin] Other (See Comments)   Unknown   Klonopin [clonazepam] Other (See Comments)   "Makes me have bad thoughts"   Macrobid [nitrofurantoin Monohyd Macro] Other (See Comments)   "kidneys shuts down" Chills, Fever.   Penicillins Hives   Has patient had a PCN reaction causing immediate rash, facial/tongue/throat swelling, SOB or lightheadedness with hypotension: Yes Has patient had a PCN reaction causing severe rash involving mucus membranes or skin necrosis: No Has patient had a PCN reaction that required hospitalization: No Has patient had a PCN reaction occurring within the last 10 years: No If all of the above answers are "NO", then may proceed with Cephalosporin use.   Sulfa Antibiotics Hives   Amitriptyline Rash   "It makes me want to fall."   Other Rash   cortisol         Medication List     STOP taking these medications    buPROPion ER 100 MG 12 hr tablet Commonly known as: WELLBUTRIN SR   doxepin 25 MG capsule Commonly known as: SINEQUAN   doxepin 50 MG capsule Commonly known as: SINEQUAN   doxycycline 100 MG capsule Commonly known as: MONODOX   glipiZIDE 5 MG tablet Commonly known as: GLUCOTROL   memantine 5 MG tablet Commonly known as: NAMENDA       TAKE these medications    Advair Diskus 250-50 MCG/DOSE Aepb Generic drug: Fluticasone-Salmeterol Inhale 1 puff into the lungs every 12 (twelve) hours.   albuterol 108 (90 Base) MCG/ACT inhaler Commonly known as: VENTOLIN HFA Inhale 2 puffs into the lungs every 6 (six) hours as needed.   ALPRAZolam 1 MG tablet Commonly known as: XANAX Take 1 tablet (1 mg total) by mouth 4 (four) times daily as needed for anxiety (home med). What changed:  when to take this reasons to take this   benzonatate 200 MG capsule Commonly known as: TESSALON Take by mouth.   butalbital-acetaminophen-caffeine 50-325-40 MG tablet Commonly known as: FIORICET Take 2 tablets by mouth  daily as needed for headache.   Calcium Carbonate-Vitamin D 600-400 MG-UNIT tablet Take 1 tablet by mouth daily.   cyclobenzaprine 10 MG tablet Commonly known as: FLEXERIL Take 10 mg by mouth 3 (three) times daily as needed.   donepezil 10 MG tablet Commonly known as: ARICEPT Take 10 mg by mouth at bedtime.   EPINEPHrine 0.3 mg/0.3 mL Soaj injection Commonly known as: EPI-PEN Inject 0.3 mg into the muscle once.   fluticasone 50 MCG/ACT nasal spray Commonly known as: FLONASE Place 2 sprays into the nose daily.   furosemide 80 MG tablet Commonly known as: LASIX Take by mouth.   gabapentin 600 MG tablet Commonly known as: NEURONTIN Take 1 tablet by mouth 3 (three) times daily.   hydrOXYzine 25 MG tablet Commonly known as: ATARAX/VISTARIL Take 25 mg by mouth 3 (three) times daily as needed for itching.    magnesium oxide 400 MG tablet Commonly known as: MAG-OX Take 400 mg by mouth daily.   meclizine 25 MG tablet Commonly known as: ANTIVERT Take 1 tablet (25 mg total) by mouth 3 (three) times daily as needed for dizziness.   morphine 15 MG tablet Commonly known as: MSIR Take 1 tablet (15 mg total) by mouth every 6 (six) hours as needed for up to 3 days for severe pain.   Narcan 4 MG/0.1ML Liqd nasal spray kit Generic drug: naloxone SMARTSIG:Both Nares   omeprazole 40 MG capsule Commonly known as: PRILOSEC Take 1 capsule by mouth 2 (two) times daily.   ondansetron 4 MG disintegrating tablet Commonly known as: ZOFRAN-ODT Take 1 tablet (4 mg total) by mouth every 8 (eight) hours as needed for nausea or vomiting.   oxyCODONE-acetaminophen 10-325 MG tablet Commonly known as: PERCOCET Take 1 tablet by mouth every 4 (four) hours as needed for pain (pt has to take [place] brand).   potassium chloride SA 20 MEQ tablet Commonly known as: KLOR-CON Take 1 tablet (20 mEq total) by mouth 2 (two) times daily.   predniSONE 5 MG tablet Commonly known as: DELTASONE Take 15 mg by mouth daily with breakfast.   Premarin vaginal cream Generic drug: conjugated estrogens Place vaginally.   promethazine 25 MG tablet Commonly known as: PHENERGAN Take 1 tablet (25 mg total) by mouth every 8 (eight) hours as needed for nausea or vomiting.   traZODone 100 MG tablet Commonly known as: DESYREL Take 100 mg by mouth at bedtime.               Discharge Care Instructions  (From admission, onward)           Start     Ordered   05/16/21 0000  Discharge wound care:       Comments: Please follow up with PCP for suture removal. - -   05/16/21 0906             Follow-up Information     Townsend Roger, MD Follow up in 1 week(s).   Specialty: Internal Medicine Contact information: [address] [place] [address] [phone]         Meade Maw, MD  Follow up in 2 week(s).   Specialty: Neurosurgery Contact information: [place] [address] [phone]                 Allergies  Allergen Reactions   Bupropion Other (See Comments), Nausea Only, Shortness Of Breath and Nausea And Vomiting    cold sweats, extended release, only can take  Watson brand Issues with extended release medications. Other reaction(s): Unknown ER- cold sweats, extended release, only can take Watson brand Issues with extended release medications.   Levaquin [Levofloxacin In D5w] Anaphylaxis   Methylprednisolone Acetate Other (See Comments)   Amoxicillin Itching   Codeine Nausea And Vomiting   Cymbalta [Duloxetine Hcl] Swelling   Gatifloxacin Hives and Swelling    Other Reaction: Other reaction: Swelling   Guaifenesin & Derivatives Hives and Swelling   Keflex [Cephalexin] Other (See Comments)    Unknown   Klonopin [Clonazepam] Other (See Comments)    "Makes me have bad thoughts"   Macrobid [Nitrofurantoin Monohyd Macro] Other (See Comments)    "kidneys shuts down" Chills, Fever.   Penicillins Hives    Has patient had a PCN reaction causing immediate rash, facial/tongue/throat swelling, SOB or lightheadedness with hypotension: Yes Has patient had a PCN reaction causing severe rash involving mucus membranes or skin necrosis: No Has patient had a PCN reaction that required hospitalization: No Has patient had a PCN reaction occurring within the last 10 years: No If all of the above answers are "NO", then may proceed with Cephalosporin use.   Sulfa Antibiotics Hives   Amitriptyline Rash    "It makes me want to fall."   Other Rash    cortisol     The results of significant diagnostics from this hospitalization (including imaging, microbiology, ancillary and laboratory) are listed below for reference.   Consultations:   Procedures/Studies: CT HEAD WO CONTRAST (5MM)  Result Date: 05/12/2021 CLINICAL DATA:  Polytrauma,  critical, head/C-spine injury suspected. Fall. EXAM: CT HEAD WITHOUT CONTRAST TECHNIQUE: Contiguous axial images were obtained from the base of the skull through the vertex without intravenous contrast. COMPARISON:  11/07/2020 FINDINGS: Brain: No acute intracranial abnormality. Specifically, no hemorrhage, hydrocephalus, mass lesion, acute infarction, or significant intracranial injury. Vascular: No hyperdense vessel or unexpected calcification. Skull: No acute calvarial abnormality. Sinuses/Orbits: No acute findings Other: None IMPRESSION: No acute intracranial abnormality. Electronically Signed   By: Rolm Baptise M.D.   On: 05/12/2021 21:12   CT Cervical Spine Wo Contrast  Result Date: 05/12/2021 CLINICAL DATA:  Fall. Polytrauma, critical, head/C-spine injury suspected EXAM: CT CERVICAL SPINE WITHOUT CONTRAST TECHNIQUE: Multidetector CT imaging of the cervical spine was performed without intravenous contrast. Multiplanar CT image reconstructions were also generated. COMPARISON:  11/07/2020 FINDINGS: Alignment: 4 mm of anterolisthesis of C5 on C6. 5 mm of anterolisthesis of C6 on C7. 3 mm of anterolisthesis of C7 on T1. Findings are stable. These are related to diffuse advanced degenerative facet disease. Skull base and vertebrae: No visible fracture. No focal bone lesion. Soft tissues and spinal canal: No prevertebral fluid or swelling. No visible canal hematoma. Disc levels:  Prior anterior at C4-5. Upper chest: No acute findings Other: None IMPRESSION: Diffuse advanced degenerative disc disease and facet disease. Grade 1-2 anterolisthesis at multiple levels as above, stable since prior study. No acute bony abnormality. Electronically Signed   By: Rolm Baptise M.D.   On: 05/12/2021 21:17   CT CHEST ABDOMEN PELVIS W CONTRAST  Result Date: 05/12/2021 CLINICAL DATA:  Chest trauma. EXAM: CT CHEST, ABDOMEN, AND PELVIS WITH CONTRAST TECHNIQUE: Multidetector CT imaging of the chest, abdomen and pelvis was  performed following the standard protocol during bolus administration of intravenous contrast. CONTRAST:  11m OMNIPAQUE IOHEXOL 300 MG/ML  SOLN COMPARISON:  Chest radiograph dated 11/07/2020. CT abdomen pelvis dated 09/12/2019. FINDINGS: CT CHEST FINDINGS Cardiovascular: No cardiomegaly. Trace pericardial effusion. Coronary vascular calcification. Mild atherosclerotic  calcification of the thoracic aorta. No aneurysmal dilatation or dissection. The origins of the great vessels of the aortic arch appear patent as visualized. The central pulmonary arteries appear unremarkable. Mediastinum/Nodes: No hilar or mediastinal adenopathy. The esophagus is grossly unremarkable. No mediastinal fluid collection. Lungs/Pleura: Bibasilar subpleural atelectasis. No focal consolidation, pleural effusion, or pneumothorax. The central airways are patent. Musculoskeletal: Degenerative changes of the spine and cervical ACDF. Multilevel cervical anterolisthesis. Minimally displaced fractures of the lateral right seventh and eighth ribs. Several additional old healed bilateral rib fractures noted. There is a 1.7 x 2.7 cm integrated area in the lateral right chest wall (39/3), possibly a small hematoma or seroma related to traumatic injury. No large fluid collection. CT ABDOMEN PELVIS FINDINGS No intra-abdominal free air or free fluid. Hepatobiliary: The liver is unremarkable. There is mild intrahepatic biliary dilatation. The gallbladder is unremarkable. Pancreas: Unremarkable. No pancreatic ductal dilatation or surrounding inflammatory changes. Spleen: Normal in size without focal abnormality. Adrenals/Urinary Tract: The adrenal glands are unremarkable. There is a 3 mm nonobstructing left renal upper pole calculus. No hydronephrosis. There is symmetric enhancement and excretion of contrast by both kidneys. The visualized ureters and urinary bladder appear unremarkable. Stomach/Bowel: There is sigmoid diverticulosis without active  inflammatory changes. There is no bowel obstruction or active inflammation. Appendectomy. Vascular/Lymphatic: Mild aortoiliac atherosclerotic disease. The IVC is unremarkable. No portal gas. There is no adenopathy. Reproductive: Hysterectomy. Other: None Musculoskeletal: Degenerative changes of the spine. Total left hip arthroplasty. Old right pubic bone fracture. No acute osseous pathology. IMPRESSION: 1. Minimally displaced fractures of the lateral right seventh and eighth ribs. No pneumothorax. 2. A 1.7 x 2.7 cm integrated area in the lateral right chest wall, possibly a small hematoma or seroma related to traumatic injury. No large fluid collection. 3. Sigmoid diverticulosis. No bowel obstruction. 4. Aortic Atherosclerosis (ICD10-I70.0). Electronically Signed   By: Anner Crete M.D.   On: 05/12/2021 21:21   DG Hip Unilat W or Wo Pelvis 2-3 Views Right  Result Date: 05/12/2021 CLINICAL DATA:  Fall, right hip pain EXAM: DG HIP (WITH OR WITHOUT PELVIS) 2-3V RIGHT COMPARISON:  None. FINDINGS: Prior left hip replacement. Remote healed right superior and inferior pubic rami fractures. No acute fracture, subluxation or dislocation. IMPRESSION: No acute bony abnormality. Electronically Signed   By: Rolm Baptise M.D.   On: 05/12/2021 20:47      Labs: BNP (last 3 results) Recent Labs    05/13/21 0758  BNP 95.1   Basic Metabolic Panel: Recent Labs  Lab 05/12/21 1946 05/13/21 0014 05/14/21 0120 05/15/21 0443 05/16/21 0814  NA 134*  --  134* 136 135  K 3.3*  --  3.6 3.6 3.3*  CL 88*  --  94* 96* 89*  CO2 32  --  29 31 39*  GLUCOSE 106*  --  238* 185* 108*  BUN 27*  --  24* 28* 30*  CREATININE 1.08*  --  1.33* 0.84 0.85  CALCIUM 9.1  --  9.4 8.7* 8.6*  MG  --  1.8  --  2.3 2.1   Liver Function Tests: Recent Labs  Lab 05/12/21 1946  AST 23  ALT 31  ALKPHOS 79  BILITOT 0.8  PROT 7.5  ALBUMIN 4.1   No results for input(s): LIPASE, AMYLASE in the last 168 hours. No results for  input(s): AMMONIA in the last 168 hours. CBC: Recent Labs  Lab 05/12/21 1946 05/15/21 0443 05/16/21 0814  WBC 10.2 19.2* 10.4  NEUTROABS 5.2  --   --  HGB 13.4 12.2 13.2  HCT 39.0 37.7 39.4  MCV 91.5 92.2 93.6  PLT 310 279 258   Cardiac Enzymes: No results for input(s): CKTOTAL, CKMB, CKMBINDEX, TROPONINI in the last 168 hours. BNP: Invalid input(s): POCBNP CBG: Recent Labs  Lab 05/15/21 0730 05/15/21 1208 05/15/21 1655 05/15/21 2133 05/16/21 0750  GLUCAP 157* 187* 154* 187* 107*   D-Dimer No results for input(s): DDIMER in the last 72 hours. Hgb A1c No results for input(s): HGBA1C in the last 72 hours. Lipid Profile No results for input(s): CHOL, HDL, LDLCALC, TRIG, CHOLHDL, LDLDIRECT in the last 72 hours. Thyroid function studies No results for input(s): TSH, T4TOTAL, T3FREE, THYROIDAB in the last 72 hours.  Invalid input(s): FREET3 Anemia work up No results for input(s): VITAMINB12, FOLATE, FERRITIN, TIBC, IRON, RETICCTPCT in the last 72 hours. Urinalysis    Component Value Date/Time   COLORURINE COLORLESS (A) 11/07/2020 1124   APPEARANCEUR CLEAR (A) 11/07/2020 1124   LABSPEC 1.001 (L) 11/07/2020 1124   PHURINE 6.0 11/07/2020 1124   GLUCOSEU NEGATIVE 11/07/2020 1124   HGBUR NEGATIVE 11/07/2020 1124   BILIRUBINUR NEGATIVE 11/07/2020 1124   KETONESUR NEGATIVE 11/07/2020 1124   PROTEINUR NEGATIVE 11/07/2020 1124   NITRITE NEGATIVE 11/07/2020 1124   LEUKOCYTESUR NEGATIVE 11/07/2020 1124   Sepsis Labs Invalid input(s): PROCALCITONIN,  WBC,  LACTICIDVEN Microbiology Recent Results (from the past 240 hour(s))  Resp Panel by RT-PCR (Flu A&B, Covid) Nasopharyngeal Swab     Status: None   Collection Time: 05/13/21  7:44 AM   Specimen: Nasopharyngeal Swab; Nasopharyngeal(NP) swabs in vial transport medium  Result Value Ref Range Status   SARS Coronavirus 2 by RT PCR NEGATIVE NEGATIVE Final    Comment: (NOTE) SARS-CoV-2 target nucleic acids are NOT  DETECTED.  The SARS-CoV-2 RNA is generally detectable in upper respiratory specimens during the acute phase of infection. The lowest concentration of SARS-CoV-2 viral copies this assay can detect is 138 copies/mL. A negative result does not preclude SARS-Cov-2 infection and should not be used as the sole basis for treatment or other patient management decisions. A negative result may occur with  improper specimen collection/handling, submission of specimen other than nasopharyngeal swab, presence of viral mutation(s) within the areas targeted by this assay, and inadequate number of viral copies(<138 copies/mL). A negative result must be combined with clinical observations, patient history, and epidemiological information. The expected result is Negative.  Fact Sheet for Patients:  EntrepreneurPulse.com.au  Fact Sheet for Healthcare Providers:  IncredibleEmployment.be  This test is no t yet approved or cleared by the [place] FDA and  has been authorized for detection and/or diagnosis of SARS-CoV-2 by FDA under an Emergency Use Authorization (EUA). This EUA will remain  in effect (meaning this test can be used) for the duration of the COVID-19 declaration under Section 564(b)(1) of the Act, 21 U.S.C.section 360bbb-3(b)(1), unless the authorization is terminated  or revoked sooner.       Influenza A by PCR NEGATIVE NEGATIVE Final   Influenza B by PCR NEGATIVE NEGATIVE Final    Comment: (NOTE) The Xpert Xpress SARS-CoV-2/FLU/RSV plus assay is intended as an aid in the diagnosis of influenza from Nasopharyngeal swab specimens and should not be used as a sole basis for treatment. Nasal washings and aspirates are unacceptable for Xpert Xpress SARS-CoV-2/FLU/RSV testing.  Fact Sheet for Patients: EntrepreneurPulse.com.au  Fact Sheet for Healthcare Providers: IncredibleEmployment.be  This test is not yet  approved or cleared by the [place] and has been authorized for  detection and/or diagnosis of SARS-CoV-2 by FDA under an Emergency Use Authorization (EUA). This EUA will remain in effect (meaning this test can be used) for the duration of the COVID-19 declaration under Section 564(b)(1) of the Act, 21 U.S.C. section 360bbb-3(b)(1), unless the authorization is terminated or revoked.  Performed at [facility], [address] [place], [place] [address]      Total time spend on discharging this patient, including the last patient exam, discussing the hospital stay, instructions for ongoing care as it relates to all pertinent caregivers, as well as preparing the medical discharge records, prescriptions, and/or referrals as applicable, is 45 minutes.    Enzo Bi, MD  [facility] 05/16/2021, 9:11 AM

## 2021-05-16 NOTE — Plan of Care (Signed)
No acute events during the night. VSS. IV abx administered.  Problem: Education: Goal: Knowledge of General Education information will improve Description: Including pain rating scale, medication(s)/side effects and non-pharmacologic comfort measures Outcome: Progressing   Problem: Health Behavior/Discharge Planning: Goal: Ability to manage health-related needs will improve Outcome: Progressing   Problem: Clinical Measurements: Goal: Ability to maintain clinical measurements within normal limits will improve Outcome: Progressing Goal: Will remain free from infection Outcome: Progressing Goal: Diagnostic test results will improve Outcome: Progressing Goal: Respiratory complications will improve Outcome: Progressing Goal: Cardiovascular complication will be avoided Outcome: Progressing   Problem: Activity: Goal: Risk for activity intolerance will decrease Outcome: Progressing   Problem: Nutrition: Goal: Adequate nutrition will be maintained Outcome: Progressing   Problem: Coping: Goal: Level of anxiety will decrease Outcome: Progressing   Problem: Elimination: Goal: Will not experience complications related to bowel motility Outcome: Progressing Goal: Will not experience complications related to urinary retention Outcome: Progressing   Problem: Pain Managment: Goal: General experience of comfort will improve Outcome: Progressing   Problem: Safety: Goal: Ability to remain free from injury will improve Outcome: Progressing   Problem: Skin Integrity: Goal: Risk for impaired skin integrity will decrease Outcome: Progressing

## 2021-05-16 NOTE — Progress Notes (Signed)
Blood pressure 112/60, pulse 84, temperature 98.7 F (37.1 C), resp. rate 16, height 5\' 8"  (1.727 m), weight 68 kg, SpO2 98 %. IV site c/d/I discussed d/c packet with pt and she verbalized understanding. PCP to removed sutures pts scripts sent in to RX  by MD. Pt sent home with some meplix and telfa for dressing change, pt transported via w/c to private car .

## 2021-05-16 NOTE — Care Management Important Message (Signed)
Important Message  Patient Details  Name: [name] MRN: [id number] Date of Birth: [birth year]   Medicare Important Message Given:  Yes     [name] A Kirsty Monjaraz 05/16/2021, 10:47 AM

## 2021-05-24 ENCOUNTER — Emergency Department: Payer: Medicare Other

## 2021-05-24 ENCOUNTER — Other Ambulatory Visit: Payer: Self-pay

## 2021-05-24 ENCOUNTER — Inpatient Hospital Stay
Admission: EM | Admit: 2021-05-24 | Discharge: 2021-05-26 | DRG: 057 | Disposition: A | Discharge status: Home / Self Care | Payer: Medicare Other | Attending: Internal Medicine | Admitting: Internal Medicine

## 2021-05-24 DIAGNOSIS — N200 Calculus of kidney: Secondary | ICD-10-CM | POA: Diagnosis present

## 2021-05-24 DIAGNOSIS — E876 Hypokalemia: Secondary | ICD-10-CM | POA: Diagnosis present

## 2021-05-24 DIAGNOSIS — M50121 Cervical disc disorder at C4-C5 level with radiculopathy: Secondary | ICD-10-CM | POA: Diagnosis present

## 2021-05-24 DIAGNOSIS — G8929 Other chronic pain: Secondary | ICD-10-CM | POA: Diagnosis present

## 2021-05-24 DIAGNOSIS — E785 Hyperlipidemia, unspecified: Secondary | ICD-10-CM | POA: Diagnosis present

## 2021-05-24 DIAGNOSIS — E249 Cushing's syndrome, unspecified: Secondary | ICD-10-CM | POA: Diagnosis present

## 2021-05-24 DIAGNOSIS — Z66 Do not resuscitate: Secondary | ICD-10-CM | POA: Diagnosis present

## 2021-05-24 DIAGNOSIS — Z882 Allergy status to sulfonamides status: Secondary | ICD-10-CM

## 2021-05-24 DIAGNOSIS — W19XXXA Unspecified fall, initial encounter: Secondary | ICD-10-CM

## 2021-05-24 DIAGNOSIS — E1142 Type 2 diabetes mellitus with diabetic polyneuropathy: Secondary | ICD-10-CM | POA: Diagnosis present

## 2021-05-24 DIAGNOSIS — E860 Dehydration: Secondary | ICD-10-CM | POA: Diagnosis present

## 2021-05-24 DIAGNOSIS — R296 Repeated falls: Secondary | ICD-10-CM

## 2021-05-24 DIAGNOSIS — I110 Hypertensive heart disease with heart failure: Secondary | ICD-10-CM | POA: Diagnosis present

## 2021-05-24 DIAGNOSIS — Z79899 Other long term (current) drug therapy: Secondary | ICD-10-CM

## 2021-05-24 DIAGNOSIS — I2510 Atherosclerotic heart disease of native coronary artery without angina pectoris: Secondary | ICD-10-CM | POA: Diagnosis present

## 2021-05-24 DIAGNOSIS — K219 Gastro-esophageal reflux disease without esophagitis: Secondary | ICD-10-CM | POA: Diagnosis present

## 2021-05-24 DIAGNOSIS — S81811A Laceration without foreign body, right lower leg, initial encounter: Secondary | ICD-10-CM | POA: Diagnosis present

## 2021-05-24 DIAGNOSIS — Z888 Allergy status to other drugs, medicaments and biological substances status: Secondary | ICD-10-CM

## 2021-05-24 DIAGNOSIS — R079 Chest pain, unspecified: Principal | ICD-10-CM

## 2021-05-24 DIAGNOSIS — F0284 Dementia in other diseases classified elsewhere, unspecified severity, with anxiety: Secondary | ICD-10-CM | POA: Diagnosis present

## 2021-05-24 DIAGNOSIS — Z9181 History of falling: Secondary | ICD-10-CM | POA: Diagnosis not present

## 2021-05-24 DIAGNOSIS — Z7952 Long term (current) use of systemic steroids: Secondary | ICD-10-CM

## 2021-05-24 DIAGNOSIS — Z20822 Contact with and (suspected) exposure to covid-19: Secondary | ICD-10-CM | POA: Diagnosis present

## 2021-05-24 DIAGNOSIS — J449 Chronic obstructive pulmonary disease, unspecified: Secondary | ICD-10-CM | POA: Diagnosis present

## 2021-05-24 DIAGNOSIS — E2740 Unspecified adrenocortical insufficiency: Secondary | ICD-10-CM | POA: Diagnosis present

## 2021-05-24 DIAGNOSIS — L03115 Cellulitis of right lower limb: Secondary | ICD-10-CM

## 2021-05-24 DIAGNOSIS — F0390 Unspecified dementia without behavioral disturbance: Secondary | ICD-10-CM | POA: Diagnosis present

## 2021-05-24 DIAGNOSIS — Z90710 Acquired absence of both cervix and uterus: Secondary | ICD-10-CM

## 2021-05-24 DIAGNOSIS — Z880 Allergy status to penicillin: Secondary | ICD-10-CM

## 2021-05-24 DIAGNOSIS — Y92009 Unspecified place in unspecified non-institutional (private) residence as the place of occurrence of the external cause: Secondary | ICD-10-CM

## 2021-05-24 DIAGNOSIS — I5032 Chronic diastolic (congestive) heart failure: Secondary | ICD-10-CM | POA: Diagnosis present

## 2021-05-24 DIAGNOSIS — F17210 Nicotine dependence, cigarettes, uncomplicated: Secondary | ICD-10-CM | POA: Diagnosis present

## 2021-05-24 DIAGNOSIS — W1839XA Other fall on same level, initial encounter: Secondary | ICD-10-CM | POA: Diagnosis present

## 2021-05-24 DIAGNOSIS — G3183 Dementia with Lewy bodies: Secondary | ICD-10-CM | POA: Diagnosis present

## 2021-05-24 DIAGNOSIS — Z881 Allergy status to other antibiotic agents status: Secondary | ICD-10-CM

## 2021-05-24 DIAGNOSIS — Z885 Allergy status to narcotic agent status: Secondary | ICD-10-CM

## 2021-05-24 DIAGNOSIS — I700 Atherosclerosis of aorta: Secondary | ICD-10-CM | POA: Diagnosis present

## 2021-05-24 DIAGNOSIS — Z79890 Hormone replacement therapy (postmenopausal): Secondary | ICD-10-CM

## 2021-05-24 DIAGNOSIS — W1839XD Other fall on same level, subsequent encounter: Secondary | ICD-10-CM | POA: Diagnosis not present

## 2021-05-24 DIAGNOSIS — Z7951 Long term (current) use of inhaled steroids: Secondary | ICD-10-CM

## 2021-05-24 DIAGNOSIS — S2241XD Multiple fractures of ribs, right side, subsequent encounter for fracture with routine healing: Secondary | ICD-10-CM

## 2021-05-24 DIAGNOSIS — M62838 Other muscle spasm: Secondary | ICD-10-CM | POA: Diagnosis present

## 2021-05-24 DIAGNOSIS — S2249XA Multiple fractures of ribs, unspecified side, initial encounter for closed fracture: Secondary | ICD-10-CM | POA: Diagnosis present

## 2021-05-24 DIAGNOSIS — Z85820 Personal history of malignant melanoma of skin: Secondary | ICD-10-CM

## 2021-05-24 DIAGNOSIS — Z833 Family history of diabetes mellitus: Secondary | ICD-10-CM

## 2021-05-24 DIAGNOSIS — E119 Type 2 diabetes mellitus without complications: Secondary | ICD-10-CM

## 2021-05-24 DIAGNOSIS — T148XXA Other injury of unspecified body region, initial encounter: Secondary | ICD-10-CM

## 2021-05-24 DIAGNOSIS — Z8249 Family history of ischemic heart disease and other diseases of the circulatory system: Secondary | ICD-10-CM

## 2021-05-24 DIAGNOSIS — Z96642 Presence of left artificial hip joint: Secondary | ICD-10-CM | POA: Diagnosis present

## 2021-05-24 DIAGNOSIS — Z9049 Acquired absence of other specified parts of digestive tract: Secondary | ICD-10-CM

## 2021-05-24 LAB — URINALYSIS, COMPLETE (UACMP) WITH MICROSCOPIC
Bacteria, UA: NONE SEEN
Bilirubin Urine: NEGATIVE
Glucose, UA: NEGATIVE mg/dL
Hgb urine dipstick: NEGATIVE
Ketones, ur: NEGATIVE mg/dL
Leukocytes,Ua: NEGATIVE
Nitrite: NEGATIVE
Protein, ur: NEGATIVE mg/dL
Specific Gravity, Urine: >1.046 — ABNORMAL HIGH (ref 1.005–1.030)
pH: 7 (ref 5.0–8.0)

## 2021-05-24 LAB — HEPATIC FUNCTION PANEL
ALT: 26 U/L (ref 0–44)
AST: 23 U/L (ref 15–41)
Albumin: 3.5 g/dL (ref 3.5–5.0)
Alkaline Phosphatase: 93 U/L (ref 38–126)
Bilirubin, Direct: <0.1 mg/dL (ref 0.0–0.2)
Total Bilirubin: 0.8 mg/dL (ref 0.3–1.2)
Total Protein: 7 g/dL (ref 6.5–8.1)

## 2021-05-24 LAB — BASIC METABOLIC PANEL
Anion gap: 11 (ref 5–15)
BUN: 16 mg/dL (ref 6–20)
CO2: 31 mmol/L (ref 22–32)
Calcium: 9.1 mg/dL (ref 8.9–10.3)
Chloride: 96 mmol/L — ABNORMAL LOW (ref 98–111)
Creatinine, Ser: 0.87 mg/dL (ref 0.44–1.00)
GFR, Estimated: >60 mL/min (ref >60)
Glucose, Bld: 140 mg/dL — ABNORMAL HIGH (ref 70–99)
Potassium: 3.4 mmol/L — ABNORMAL LOW (ref 3.5–5.1)
Sodium: 138 mmol/L (ref 135–145)

## 2021-05-24 LAB — CBC
HCT: 42.4 % (ref 36.0–46.0)
Hemoglobin: 13.4 g/dL (ref 12.0–15.0)
MCH: 30.1 pg (ref 26.0–34.0)
MCHC: 31.6 g/dL (ref 30.0–36.0)
MCV: 95.3 fL (ref 80.0–100.0)
Platelets: 350 10*3/uL (ref 150–400)
RBC: 4.45 MIL/uL (ref 3.87–5.11)
RDW: 14.8 % (ref 11.5–15.5)
WBC: 10.6 10*3/uL — ABNORMAL HIGH (ref 4.0–10.5)
nRBC: 0 % (ref 0.0–0.2)

## 2021-05-24 LAB — CK: Total CK: 27 U/L — ABNORMAL LOW (ref 38–234)

## 2021-05-24 LAB — PROCALCITONIN: Procalcitonin: <0.1 ng/mL

## 2021-05-24 LAB — RESP PANEL BY RT-PCR (FLU A&B, COVID) ARPGX2
Influenza A by PCR: NEGATIVE
Influenza B by PCR: NEGATIVE
SARS Coronavirus 2 by RT PCR: NEGATIVE

## 2021-05-24 LAB — TROPONIN I (HIGH SENSITIVITY)
Troponin I (High Sensitivity): 8 ng/L (ref <18)
Troponin I (High Sensitivity): 7 ng/L (ref <18)

## 2021-05-24 LAB — LACTIC ACID, PLASMA
Lactic Acid, Venous: 2.3 mmol/L (ref 0.5–1.9)
Lactic Acid, Venous: 1.9 mmol/L (ref 0.5–1.9)

## 2021-05-24 LAB — MAGNESIUM: Magnesium: 2.1 mg/dL (ref 1.7–2.4)

## 2021-05-24 MED ORDER — SODIUM CHLORIDE 0.9 % IV SOLN
100.0000 mg | Freq: Two times a day (BID) | INTRAVENOUS | Status: DC
Start: 2021-05-24 — End: 2021-05-25
  Administered 2021-05-24 – 2021-05-25 (×2): 100 mg via INTRAVENOUS
  Filled 2021-05-24 (×3): qty 100

## 2021-05-24 MED ORDER — LACTATED RINGERS IV BOLUS
1000.0000 mL | Freq: Once | INTRAVENOUS | Status: AC
  Administered 2021-05-24: 1000 mL via INTRAVENOUS

## 2021-05-24 MED ORDER — POTASSIUM CHLORIDE CRYS ER 20 MEQ PO TBCR
40.0000 meq | EXTENDED_RELEASE_TABLET | Freq: Once | ORAL | Status: AC
  Administered 2021-05-24: 40 meq via ORAL
  Filled 2021-05-24: qty 2

## 2021-05-24 MED ORDER — HYDROXYZINE HCL 25 MG PO TABS
25.0000 mg | ORAL_TABLET | Freq: Three times a day (TID) | ORAL | Status: DC | PRN
  Administered 2021-05-25 – 2021-05-26 (×2): 25 mg via ORAL
  Filled 2021-05-24 (×3): qty 1

## 2021-05-24 MED ORDER — LACTATED RINGERS IV SOLN: INTRAVENOUS | Status: DC

## 2021-05-24 MED ORDER — OXYCODONE-ACETAMINOPHEN 5-325 MG PO TABS
1.0000 | ORAL_TABLET | Freq: Once | ORAL | Status: AC
  Administered 2021-05-24: 1 via ORAL
  Filled 2021-05-24: qty 1

## 2021-05-24 MED ORDER — ONDANSETRON HCL 4 MG/2ML IJ SOLN: 4.0000 mg | Freq: Once | INTRAMUSCULAR | Status: AC

## 2021-05-24 MED ORDER — ONDANSETRON HCL 4 MG PO TABS: 4.0000 mg | ORAL_TABLET | Freq: Four times a day (QID) | ORAL | Status: DC | PRN

## 2021-05-24 MED ORDER — CELECOXIB 200 MG PO CAPS
200.0000 mg | ORAL_CAPSULE | Freq: Once | ORAL | Status: AC
  Administered 2021-05-24: 200 mg via ORAL
  Filled 2021-05-24: qty 1

## 2021-05-24 MED ORDER — GABAPENTIN 600 MG PO TABS
600.0000 mg | ORAL_TABLET | Freq: Three times a day (TID) | ORAL | Status: DC
  Administered 2021-05-24 – 2021-05-26 (×5): 600 mg via ORAL
  Filled 2021-05-24 (×5): qty 1

## 2021-05-24 MED ORDER — MORPHINE SULFATE (PF) 4 MG/ML IV SOLN
6.0000 mg | Freq: Once | INTRAVENOUS | Status: AC
  Administered 2021-05-24: 6 mg via INTRAVENOUS
  Filled 2021-05-24: qty 2

## 2021-05-24 MED ORDER — ACETAMINOPHEN 325 MG PO TABS
650.0000 mg | ORAL_TABLET | Freq: Once | ORAL | Status: AC
  Administered 2021-05-24: 650 mg via ORAL
  Filled 2021-05-24: qty 2

## 2021-05-24 MED ORDER — ALBUTEROL SULFATE (2.5 MG/3ML) 0.083% IN NEBU: 2.5000 mg | INHALATION_SOLUTION | Freq: Four times a day (QID) | RESPIRATORY_TRACT | Status: DC | PRN

## 2021-05-24 MED ORDER — MAGNESIUM OXIDE 400 MG PO TABS
400.0000 mg | ORAL_TABLET | Freq: Every day | ORAL | Status: DC
  Administered 2021-05-25 – 2021-05-26 (×2): 400 mg via ORAL
  Filled 2021-05-24 (×4): qty 1

## 2021-05-24 MED ORDER — PREDNISONE 10 MG PO TABS
15.0000 mg | ORAL_TABLET | Freq: Every day | ORAL | Status: DC
  Administered 2021-05-25 – 2021-05-26 (×2): 15 mg via ORAL
  Filled 2021-05-24 (×2): qty 2

## 2021-05-24 MED ORDER — ENOXAPARIN SODIUM 40 MG/0.4ML IJ SOSY
40.0000 mg | PREFILLED_SYRINGE | INTRAMUSCULAR | Status: DC
  Administered 2021-05-24: 40 mg via SUBCUTANEOUS
  Filled 2021-05-24: qty 0.4

## 2021-05-24 MED ORDER — MECLIZINE HCL 25 MG PO TABS
25.0000 mg | ORAL_TABLET | Freq: Three times a day (TID) | ORAL | Status: DC | PRN
  Administered 2021-05-26: 25 mg via ORAL
  Filled 2021-05-24 (×2): qty 1

## 2021-05-24 MED ORDER — MORPHINE SULFATE (PF) 4 MG/ML IV SOLN
4.0000 mg | Freq: Once | INTRAVENOUS | Status: AC
  Administered 2021-05-24: 4 mg via INTRAVENOUS
  Filled 2021-05-24: qty 1

## 2021-05-24 MED ORDER — TRAZODONE HCL 100 MG PO TABS
100.0000 mg | ORAL_TABLET | Freq: Every day | ORAL | Status: DC
  Administered 2021-05-24 – 2021-05-25 (×2): 100 mg via ORAL
  Filled 2021-05-24 (×2): qty 1

## 2021-05-24 MED ORDER — FUROSEMIDE 40 MG PO TABS
80.0000 mg | ORAL_TABLET | Freq: Every day | ORAL | Status: DC
  Filled 2021-05-24 (×2): qty 2

## 2021-05-24 MED ORDER — ONDANSETRON HCL 4 MG/2ML IJ SOLN
4.0000 mg | Freq: Four times a day (QID) | INTRAMUSCULAR | Status: DC | PRN
  Administered 2021-05-24 – 2021-05-26 (×3): 4 mg via INTRAVENOUS
  Filled 2021-05-24 (×3): qty 2

## 2021-05-24 MED ORDER — IBUPROFEN 400 MG PO TABS: 400.0000 mg | ORAL_TABLET | Freq: Four times a day (QID) | ORAL | Status: DC | PRN

## 2021-05-24 MED ORDER — OXYCODONE-ACETAMINOPHEN 7.5-325 MG PO TABS
1.0000 | ORAL_TABLET | ORAL | Status: DC | PRN
  Administered 2021-05-25 – 2021-05-26 (×3): 1 via ORAL
  Filled 2021-05-24 (×3): qty 1

## 2021-05-24 MED ORDER — ONDANSETRON HCL 4 MG/2ML IJ SOLN
4.0000 mg | Freq: Once | INTRAMUSCULAR | Status: AC
  Administered 2021-05-24: 4 mg via INTRAVENOUS
  Filled 2021-05-24: qty 2

## 2021-05-24 MED ORDER — ESTROGENS CONJUGATED 0.625 MG/GM VA CREA: 1.0000 | TOPICAL_CREAM | Freq: Every day | VAGINAL | Status: DC

## 2021-05-24 MED ORDER — FLUTICASONE FUROATE-VILANTEROL 200-25 MCG/ACT IN AEPB
1.0000 | INHALATION_SPRAY | Freq: Every day | RESPIRATORY_TRACT | Status: DC
  Administered 2021-05-25 – 2021-05-26 (×2): 1 via RESPIRATORY_TRACT
  Filled 2021-05-24: qty 28

## 2021-05-24 MED ORDER — ALPRAZOLAM 0.5 MG PO TABS
1.0000 mg | ORAL_TABLET | Freq: Four times a day (QID) | ORAL | Status: DC | PRN
  Administered 2021-05-24 – 2021-05-26 (×5): 1 mg via ORAL
  Filled 2021-05-24 (×5): qty 2

## 2021-05-24 MED ORDER — PANTOPRAZOLE SODIUM 40 MG PO TBEC
80.0000 mg | DELAYED_RELEASE_TABLET | Freq: Every day | ORAL | Status: DC
  Administered 2021-05-24 – 2021-05-26 (×3): 80 mg via ORAL
  Filled 2021-05-24 (×3): qty 2

## 2021-05-24 MED ORDER — LACTATED RINGERS IV BOLUS
500.0000 mL | Freq: Once | INTRAVENOUS | Status: AC
  Administered 2021-05-24: 500 mL via INTRAVENOUS

## 2021-05-24 MED ORDER — ALBUTEROL SULFATE HFA 108 (90 BASE) MCG/ACT IN AERS: 2.0000 | INHALATION_SPRAY | Freq: Four times a day (QID) | RESPIRATORY_TRACT | Status: DC | PRN

## 2021-05-24 MED ORDER — POLYETHYLENE GLYCOL 3350 17 G PO PACK: 17.0000 g | PACK | Freq: Every day | ORAL | Status: DC | PRN

## 2021-05-24 MED ORDER — DONEPEZIL HCL 5 MG PO TABS
10.0000 mg | ORAL_TABLET | Freq: Every day | ORAL | Status: DC
  Administered 2021-05-24 – 2021-05-25 (×2): 10 mg via ORAL
  Filled 2021-05-24 (×2): qty 2

## 2021-05-24 MED ORDER — IOHEXOL 350 MG/ML SOLN
100.0000 mL | Freq: Once | INTRAVENOUS | Status: AC | PRN
  Administered 2021-05-24: 100 mL via INTRAVENOUS
  Filled 2021-05-24: qty 100

## 2021-05-24 MED ORDER — CYCLOBENZAPRINE HCL 10 MG PO TABS
10.0000 mg | ORAL_TABLET | Freq: Three times a day (TID) | ORAL | Status: DC | PRN
  Administered 2021-05-24 – 2021-05-26 (×3): 10 mg via ORAL
  Filled 2021-05-24 (×3): qty 1

## 2021-05-24 MED ORDER — SODIUM CHLORIDE 0.9% FLUSH
3.0000 mL | Freq: Two times a day (BID) | INTRAVENOUS | Status: DC
  Administered 2021-05-24 – 2021-05-26 (×4): 3 mL via INTRAVENOUS

## 2021-05-24 MED ORDER — POTASSIUM CHLORIDE CRYS ER 20 MEQ PO TBCR
20.0000 meq | EXTENDED_RELEASE_TABLET | Freq: Two times a day (BID) | ORAL | Status: DC
  Administered 2021-05-24 – 2021-05-26 (×4): 20 meq via ORAL
  Filled 2021-05-24 (×4): qty 1

## 2021-05-24 MED ORDER — MORPHINE SULFATE (PF) 2 MG/ML IV SOLN
2.0000 mg | INTRAVENOUS | Status: DC | PRN
  Administered 2021-05-24 – 2021-05-26 (×4): 2 mg via INTRAVENOUS
  Filled 2021-05-24 (×4): qty 1

## 2021-05-24 MED ORDER — ONDANSETRON HCL 4 MG/2ML IJ SOLN
INTRAMUSCULAR | Status: AC
  Administered 2021-05-24: 4 mg via INTRAVENOUS
  Filled 2021-05-24: qty 2

## 2021-05-24 MED ORDER — CLINDAMYCIN PHOSPHATE 600 MG/50ML IV SOLN
600.0000 mg | Freq: Three times a day (TID) | INTRAVENOUS | Status: DC
  Filled 2021-05-24: qty 50

## 2021-05-24 MED ORDER — PROMETHAZINE HCL 25 MG PO TABS
25.0000 mg | ORAL_TABLET | Freq: Three times a day (TID) | ORAL | Status: DC | PRN
  Administered 2021-05-25: 25 mg via ORAL
  Filled 2021-05-24 (×2): qty 1

## 2021-05-24 NOTE — Plan of Care (Signed)
Received patient from ED alert, oriented and without distress. Patient ambulated to bed from stretcher with minimal difficulty. Patient settled into room. Will continue to monitor as per orders and care plan.

## 2021-05-24 NOTE — ED Triage Notes (Signed)
Pt presents to ER c/o chest pain that started today.  Pt states the chest pain started a few days ago.  Pt states chest pain is right in center of chest and is difficult to take deep breath.  Pt noted to have decreased lung sounds on right side.  Pt states she also had fall on Wednesday and has scattered bruising and skin tears all over.  Pt A&O x4.

## 2021-05-24 NOTE — ED Notes (Signed)
Patient has a supper tray at bedside.

## 2021-05-24 NOTE — ED Notes (Signed)
Patient ambulated with one stand-by assist to hallway bathroom.

## 2021-05-24 NOTE — ED Provider Notes (Signed)
[facility] Emergency Department Provider Note  ____________________________________________   Event Date/Time   First MD Initiated Contact with Patient 05/24/21 [phone]     (approximate)  I have reviewed the triage vital signs and the nursing notes.   HISTORY  Chief Complaint Fall and Chest Pain   HPI [name] is a 60 y.o. female with medical history significant of dementia, diabetes mellitus, advanced cervical degenerative disease, COPD, depression with anxiety, chronic pain, dCHF, melanoma, tobacco abuse, CAD, adrenal insufficiency and 2 recent falls as well as recent admission for the first fall 2010/25-10/28 with patient reporting second fall on 11/2 coming in for worsening chest pain primarily but overall complaining of fairly severe diffuse pain including in the back, extremities head and neck.  Patient states that she fractured her right sided seventh and eighth rib fractures when she first fell and had a skin injury required suture repair.  They state that she has been taking all her medicines as prescribed and has not been taking her as needed Lasix but has been feeling more tired and in pain.  She states that both times she fell she thinks her legs just gave out from underneath her.  She denies any tripping or slipping.  He is not sure if she was lightheaded felt any symptoms beforehand.  She denies any significant cough, vomiting, diarrhea, burning with urination or other clear acute associated sick symptoms.  No other acute concerns at this time.         Past Medical History:  Diagnosis Date   Adrenal insufficiency (HCC)    Anxiety    CAD (coronary artery disease)    Cancer (HCC)    skin   Chronic diastolic CHF (congestive heart failure) (HCC)    Chronic pain    COPD (chronic obstructive pulmonary disease) ([place])    Cushing disease ([place])    Dementia (HCC)    GERD (gastroesophageal reflux disease)    HLD (hyperlipidemia)     Patient  Active Problem List   Diagnosis Date Noted   Fall 05/13/2021   Hypokalemia 05/13/2021   CAD (coronary artery disease)    Fall at home, initial encounter    Laceration of right chest wall    Open fracture of rib of right side_7th and 8th rib    Depression with anxiety    Dislocation of internal left hip prosthesis ([place]) 12/06/2019   Preoperative clearance 12/06/2019   Accidental fall 12/06/2019   Scalp laceration, initial encounter 12/06/2019   Type 2 diabetes mellitus without complication ([place]) 62/69/4854   Dementia without behavioral disturbance ([place]) 12/06/2019   S/P hip replacement, left 12/06/2019   Injury of conjunctiva and corneal abrasion of right eye without foreign body 11/19/2019   Intractable nausea and vomiting 09/12/2019   Vertigo 09/12/2019   Chronic diastolic CHF (congestive heart failure) ([place]) 09/12/2019   Current chronic use of systemic steroids 09/12/2019   AKI (acute kidney injury) ([place]) 09/12/2019   Hypokalemia due to excessive gastrointestinal loss of potassium 09/12/2019   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 03/28/2019   Cervical disc disorder at C4-C5 level with radiculopathy 03/28/2019   Hallucinations, visual 10/19/2018   Tobacco abuse 03/08/2018   Iron deficiency 11/17/2017   Abdominal pain 09/07/2017   Diarrhea    Secondary adrenal insufficiency ([place]) 07/26/2017   Flexor tenosynovitis of finger 05/15/2017   Sepsis ([place]) 05/15/2017   Early onset Alzheimer's dementia without behavioral disturbance ([place]) 02/16/2017   Low back pain 02/09/2017  Radiculopathy of lumbosacral region 01/07/2017   Neuropathy 11/16/2016   Disturbance of sleep 10/28/2016   Pharyngeal dysphagia 09/24/2016   Ulcer of left lower extremity with fat layer exposed ([place]) 09/24/2016   Venous insufficiency 09/24/2016   Sensorineural hearing loss (SNHL) of both ears 08/20/2016   Sinusitis chronic, ethmoidal 08/20/2016   At high risk for falls 07/21/2016   Complex sleep apnea  syndrome 07/21/2016   History of fall within past 90 days 07/21/2016   Osteopenia 07/21/2016   Polyneuropathy, idiopathic progressive 07/21/2016   Sleep walking 07/21/2016   Malignant melanoma of lower leg, left ([place]) 11/05/2015   Chest pain 06/17/2015   Elevated troponin 06/17/2015   Fracture, ribs 06/17/2015   GERD (gastroesophageal reflux disease) 06/17/2015   COPD (chronic obstructive pulmonary disease) ([place]) 06/17/2015   Chronic pain 06/17/2015   Anxiety 06/17/2015   Adrenal insufficiency ([place]) 06/17/2015   Allergic rhinitis due to allergen 10/02/2011    Past Surgical History:  Procedure Laterality Date   APPENDECTOMY     HIP CLOSED REDUCTION Left 12/06/2019   Procedure: CLOSED MANIPULATION HIP;  Surgeon: Lovell Sheehan, MD;  Location: [facility] ORS;  Service: Orthopedics;  Laterality: Left;   NASAL SEPTUM SURGERY     NASAL SINUS SURGERY     UMBILICAL HERNIA REPAIR     VAGINAL HYSTERECTOMY      Prior to Admission medications   Medication Sig Start Date End Date Taking? Authorizing Provider  albuterol (VENTOLIN HFA) 108 (90 Base) MCG/ACT inhaler Inhale 2 puffs into the lungs every 6 (six) hours as needed. 06/04/15   [provider]  ALPRAZolam Duanne Moron) 1 MG tablet Take 1 tablet (1 mg total) by mouth 4 (four) times daily as needed for anxiety (home med). 05/16/21   Enzo Bi, MD  benzonatate (TESSALON) 200 MG capsule Take by mouth. 03/01/20   [provider]  butalbital-acetaminophen-caffeine (FIORICET) 50-325-40 MG tablet Take 2 tablets by mouth daily as needed for headache. 05/16/21   Enzo Bi, MD  Calcium Carbonate-Vitamin D 600-400 MG-UNIT tablet Take 1 tablet by mouth daily. 05/22/15 09/07/17  [provider]  cyclobenzaprine (FLEXERIL) 10 MG tablet Take 10 mg by mouth 3 (three) times daily as needed.     [provider]  donepezil (ARICEPT) 10 MG tablet Take 10 mg by mouth at bedtime.    [provider]  EPINEPHrine 0.3 mg/0.3 mL IJ  SOAJ injection Inject 0.3 mg into the muscle once. 04/01/18   [provider]  fluticasone (FLONASE) 50 MCG/ACT nasal spray Place 2 sprays into the nose daily. 01/02/13   [provider]  Fluticasone-Salmeterol (ADVAIR DISKUS) 250-50 MCG/DOSE AEPB Inhale 1 puff into the lungs every 12 (twelve) hours. 06/04/15 09/07/17  [provider]  furosemide (LASIX) 80 MG tablet Take by mouth. 03/01/20   [provider]  gabapentin (NEURONTIN) 600 MG tablet Take 1 tablet by mouth 3 (three) times daily.    [provider]  hydrOXYzine (ATARAX/VISTARIL) 25 MG tablet Take 25 mg by mouth 3 (three) times daily as needed for itching. 03/23/18   [provider]  magnesium oxide (MAG-OX) 400 MG tablet Take 400 mg by mouth daily.    [provider]  meclizine (ANTIVERT) 25 MG tablet Take 1 tablet (25 mg total) by mouth 3 (three) times daily as needed for dizziness. 09/14/19   Edwin Dada, MD  NARCAN 4 MG/0.1ML LIQD nasal spray kit SMARTSIG:Both Nares 03/01/20   [provider]  omeprazole (PRILOSEC) 40 MG capsule  Take 1 capsule by mouth 2 (two) times daily. 02/19/15 09/07/17  [provider]  ondansetron (ZOFRAN-ODT) 4 MG disintegrating tablet Take 1 tablet (4 mg total) by mouth every 8 (eight) hours as needed for nausea or vomiting. 09/14/19   Danford, Suann Larry, MD  oxyCODONE-acetaminophen (PERCOCET) 10-325 MG tablet Take 1 tablet by mouth every 4 (four) hours as needed for pain (pt has to take [place] brand).  10/05/12   [provider]  potassium chloride SA (K-DUR,KLOR-CON) 20 MEQ tablet Take 1 tablet (20 mEq total) by mouth 2 (two) times daily. 09/09/17   Epifanio Lesches, MD  predniSONE (DELTASONE) 5 MG tablet Take 15 mg by mouth daily with breakfast.    [provider]  PREMARIN vaginal cream Place vaginally. 03/01/20   [provider]  promethazine (PHENERGAN) 25 MG tablet Take 1 tablet (25 mg  total) by mouth every 8 (eight) hours as needed for nausea or vomiting. 09/14/19   Danford, Suann Larry, MD  traZODone (DESYREL) 100 MG tablet Take 100 mg by mouth at bedtime. 02/12/21   [provider]    Allergies Bupropion, Levaquin [levofloxacin in d5w], Methylprednisolone acetate, Amoxicillin, Codeine, Cymbalta [duloxetine hcl], Gatifloxacin, Guaifenesin & derivatives, Keflex [cephalexin], Klonopin [clonazepam], Macrobid [nitrofurantoin monohyd macro], Penicillins, Sulfa antibiotics, Amitriptyline, and Other  Family History  Problem Relation Age of Onset   Ovarian cancer Mother    Breast cancer Mother    Brain cancer Mother    CAD Father    Diabetes Father    Hepatitis Father    Liver disease Father    Cancer Father    Alcohol abuse Father    Heart attack Brother    Sleep apnea Brother    Diabetes Brother    ALS Paternal Grandmother    Stroke Maternal Grandmother     Social History Social History   Tobacco Use   Smoking status: Every Day    Packs/day: 0.50    Types: Cigarettes   Smokeless tobacco: Never  Vaping Use   Vaping Use: Never used  Substance Use Topics   Alcohol use: Yes    Alcohol/week: 0.0 standard drinks    Comment: rarely   Drug use: No    Review of Systems  Review of Systems  Constitutional:  Positive for malaise/fatigue. Negative for chills and fever.  HENT:  Negative for sore throat.   Eyes:  Negative for pain.  Respiratory:  Positive for shortness of breath. Negative for cough and stridor.   Cardiovascular:  Positive for chest pain.  Gastrointestinal:  Negative for vomiting.  Genitourinary:  Negative for dysuria.  Musculoskeletal:  Positive for back pain, falls, myalgias and neck pain.  Skin:  Negative for rash.  Neurological:  Positive for dizziness, weakness and headaches. Negative for seizures and loss of consciousness.  Psychiatric/Behavioral:  Negative for suicidal ideas.   All other systems reviewed and are negative.     ____________________________________________   PHYSICAL EXAM:  VITAL SIGNS: ED Triage Vitals  Enc Vitals Group     BP 05/24/21 0627 (!) 150/70     Pulse Rate 05/24/21 0627 (!) 112     Resp 05/24/21 0627 20     Temp 05/24/21 0627 99.7 F (37.6 C)     Temp Source 05/24/21 0627 Oral     SpO2 05/24/21 0627 100 %     Weight 05/24/21 0633 150 lb (68 kg)     Height 05/24/21 0633 _0  (1.6 m)     Head Circumference --  Peak Flow --      Pain Score 05/24/21 0632 10     Pain Loc --      Pain Edu? --      Excl. in [place]? --    Vitals:   05/24/21 1100 05/24/21 1200  BP: 132/82 137/87  Pulse: (!) 108 (!) 110  Resp:    Temp:    SpO2: 93% 95%   Physical Exam Vitals and nursing note reviewed.  Constitutional:      General: She is not in acute distress.    Appearance: She is well-developed. She is ill-appearing.  HENT:     Head: Normocephalic.     Right Ear: External ear normal.     Left Ear: External ear normal.     Nose: Nose normal.     Mouth/Throat:     Mouth: Mucous membranes are dry.  Eyes:     Conjunctiva/sclera: Conjunctivae normal.  Cardiovascular:     Rate and Rhythm: Normal rate and regular rhythm.     Heart sounds: No murmur heard. Pulmonary:     Effort: Pulmonary effort is normal. No respiratory distress.     Breath sounds: Normal breath sounds.  Abdominal:     Palpations: Abdomen is soft.     Tenderness: There is no abdominal tenderness.  Musculoskeletal:     Cervical back: Neck supple.  Skin:    General: Skin is warm and dry.     Capillary Refill: Capillary refill takes 2 to 3 seconds.  Neurological:     Mental Status: She is alert and oriented to person, place, and time.  Psychiatric:        Mood and Affect: Mood normal.    Cranial nerves II through XII are grossly intact.  There is a small scab on the inferior aspect of the chin slightly to the right of midline.  No other significant surrounding tenderness.  Scattered erythema.  No other  obvious significant trauma to the face scalp or head although some C-spine, T-spine and L-spine tenderness.  No overlying skin changes over the back.  Sutures appear in place over the right-sided chest laceration previously repaired with healthy-appearing surrounding skin a laceration site appearing clean dry and intact.  Abdomen is soft nontender throughout with her significant bruising over the posterior lateral right hip and proximal right upper thigh.  There is also significant scattered bruising over the bilateral lower extremities and a skin tear over the anterior mid shin.  2+ radial and DP pulses. ____________________________________________   LABS (all labs ordered are listed, but only abnormal results are displayed)  Labs Reviewed  BASIC METABOLIC PANEL - Abnormal; Notable for the following components:      Result Value   Potassium 3.4 (*)    Chloride 96 (*)    Glucose, Bld 140 (*)    All other components within normal limits  CBC - Abnormal; Notable for the following components:   WBC 10.6 (*)    All other components within normal limits  URINALYSIS, COMPLETE (UACMP) WITH MICROSCOPIC - Abnormal; Notable for the following components:   Color, Urine YELLOW (*)    APPearance CLEAR (*)    Specific Gravity, Urine >1.046 (*)    All other components within normal limits  LACTIC ACID, PLASMA - Abnormal; Notable for the following components:   Lactic Acid, Venous 2.3 (*)    All other components within normal limits  RESP PANEL BY RT-PCR (FLU A&B, COVID) [id number]  HEPATIC FUNCTION PANEL  LACTIC ACID, PLASMA  PROCALCITONIN  MAGNESIUM  LACTIC ACID, PLASMA  TROPONIN I (HIGH SENSITIVITY)  TROPONIN I (HIGH SENSITIVITY)   ____________________________________________  EKG  ECG remarkable for sinus tachycardia with ventricular rate of 114, normal axis, unremarkable intervals without evidence of acute ischemia or significant  arrhythmia. ____________________________________________  RADIOLOGY  ED MD interpretation:    CT head without evidence of skull fracture, intracranial hemorrhage, ischemia or other acute intracranial process.  CT C-spine shows no acute cervical spine or or significant traumatic findings but there is advanced spondylosis and some spondylolisthesis.  CTA chest has no evidence of PE, pneumonia, large effusion or other clear acute process.  Previously noted mildly displaced right seventh and eighth rib fractures are again noted and some very old chronic bilateral fractures.  Reformats of the T and L-spine showed no acute T or L-spine fractures.  No visceral injuries identified on CT of the abdomen pelvis.  There is evidence of some diverticulosis and small kidney stones as well as aortic atherosclerosis but no evidence of an SBO, diverticulitis, hematoma, perinephric stranding or any other acute intra-abdominal pelvic process.  Chest x-ray shows previously noted right seventh and eighth rib fractures without any other clear acute process  Plain film of the right tib-fib shows no acute fracture dislocation.    Plain film of the right knee shows no acute fracture dislocation.    Plain film of the left knee shows no acute fracture or dislocation.  Official radiology report(s): DG Chest 2 View  Result Date: 05/24/2021 CLINICAL DATA:  Central chest pain and pleuritic chest pain. Recent fall with right rib fractures by CT. EXAM: CHEST - 2 VIEW COMPARISON:  CT of the chest on 05/12/2021 FINDINGS: The heart size and mediastinal contours are within normal limits. There is no evidence of pulmonary edema, consolidation, pneumothorax, nodule or pleural fluid. The acute minimally displaced fractures of the lateral right seventh and eighth ribs seen by CT are difficult to appreciate by x-ray but show no evidence of significant displacement. Several old left-sided rib fractures are noted at the level of the  fifth, sixth and eighth ribs. IMPRESSION: Recent lateral right seventh and eighth rib fractures are difficult to see by chest x-ray but show no significant displacement. No associated pneumothorax or pleural fluid. Old left-sided rib fractures again noted. Electronically Signed   By: Aletta Edouard M.D.   On: 05/24/2021 08:38   DG Tibia/Fibula Right  Result Date: 05/24/2021 CLINICAL DATA:  Fall EXAM: RIGHT TIBIA AND FIBULA - 2 VIEW COMPARISON:  None. FINDINGS: There is no evidence of fracture or other focal bone lesions. Soft tissues are unremarkable. IMPRESSION: Negative. Electronically Signed   By: Franchot Gallo M.D.   On: 05/24/2021 10:27   CT HEAD WO CONTRAST (5MM)  Result Date: 05/24/2021 CLINICAL DATA:  Fall EXAM: CT HEAD WITHOUT CONTRAST CT CERVICAL SPINE WITHOUT CONTRAST TECHNIQUE: Multidetector CT imaging of the head and cervical spine was performed following the standard protocol without intravenous contrast. Multiplanar CT image reconstructions of the cervical spine were also generated. COMPARISON:  CT head 05/12/2021 FINDINGS: CT HEAD FINDINGS Brain: No evidence of acute infarction, hemorrhage, hydrocephalus, extra-axial collection or mass lesion/mass effect. Mild atrophy Vascular: Negative for hyperdense vessel Skull: Negative for fracture Sinuses/Orbits: Prior sinus surgery. Paranasal sinuses clear. Negative orbit Other: None CT CERVICAL SPINE FINDINGS Alignment: 3 mm anterolisthesis C5-6. 6 mm anterolisthesis C6-7. 3 mm anterolisthesis C7-T1. Alignment is similar to the prior CT 11/07/2020 Skull base and vertebrae: Negative for fracture Soft tissues and spinal canal:  Negative for soft tissue mass or swelling. Disc levels: Disc degeneration facet degeneration throughout the cervical spine. ACDF with solid fusion at C4-5. Upper chest: No acute abnormality Other: None IMPRESSION: 1. No acute intracranial abnormality 2. Negative for cervical spine fracture 3. Advanced spondylosis and  spondylolisthesis. Electronically Signed   By: Franchot Gallo M.D.   On: 05/24/2021 10:39   CT Angio Chest PE W and/or Wo Contrast  Result Date: 05/24/2021 CLINICAL DATA:  60 year old female with chest pain, recent fall. Decreased lung sounds on the right. EXAM: CT ANGIOGRAPHY CHEST WITH CONTRAST TECHNIQUE: Multidetector CT imaging of the chest was performed using the standard protocol during bolus administration of intravenous contrast. Multiplanar CT image reconstructions and MIPs were obtained to evaluate the vascular anatomy. CONTRAST:  133m OMNIPAQUE IOHEXOL 350 MG/ML SOLN COMPARISON:  CT Chest, Abdomen, and Pelvis 05/12/2021. Thoracic and lumbar spine CT today reported separately. FINDINGS: Cardiovascular: Good contrast bolus timing in the pulmonary arterial tree. No focal filling defect identified in the pulmonary arteries to suggest acute pulmonary embolism. No cardiomegaly or pericardial effusion. Negative thoracic aorta aside from mild calcified atherosclerosis. Mediastinum/Nodes: Negative. No mediastinal hematoma or lymphadenopathy. Lungs/Pleura: No pleural effusion. No pneumothorax. Lower lung volumes compared to last month. Major airways remain patent. Mild dependent atelectasis. No pulmonary contusion or other confluent opacity. Upper Abdomen: Negative visible liver, gallbladder, spleen, pancreas, adrenal glands and bowel in the upper abdomen. Mild nephrolithiasis. Musculoskeletal: Thoracic spine detailed separately. There is advanced lower cervical spondylolisthesis superimposed on a previous C4-C5 (or C3 through C5) ACDF. Osteopenia. Mildly displaced right lateral 7th and 8th rib fractures again noted. There are superimposed chronic right lateral 5, 6, 9 and 10 rib fractures. No new right rib fracture compared to last month. And right lateral chest wall soft tissue swelling situated between the lateral 7th and 8th ribs has decreased compatible with resolving soft tissue hematoma (series 4, image  67 today). No acute left rib fracture. Chronic left 4th and 5th rib fractures. Intact sternum. Review of the MIP images confirms the above findings. IMPRESSION: 1. No evidence of acute pulmonary embolus. 2. Lower lung volumes compared to last month with mild atelectasis. Mildly displaced right lateral 7th and 8th rib fractures again noted. Underlying chronic bilateral rib fractures. 3. No new traumatic injury identified in the chest. Thoracic Spine CT reported separately. Advanced lower cervical spondylolisthesis superimposed on previous ACDF. Electronically Signed   By: HGenevie AnnM.D.   On: 05/24/2021 10:40   CT Cervical Spine Wo Contrast  Result Date: 05/24/2021 CLINICAL DATA:  Fall EXAM: CT HEAD WITHOUT CONTRAST CT CERVICAL SPINE WITHOUT CONTRAST TECHNIQUE: Multidetector CT imaging of the head and cervical spine was performed following the standard protocol without intravenous contrast. Multiplanar CT image reconstructions of the cervical spine were also generated. COMPARISON:  CT head 05/12/2021 FINDINGS: CT HEAD FINDINGS Brain: No evidence of acute infarction, hemorrhage, hydrocephalus, extra-axial collection or mass lesion/mass effect. Mild atrophy Vascular: Negative for hyperdense vessel Skull: Negative for fracture Sinuses/Orbits: Prior sinus surgery. Paranasal sinuses clear. Negative orbit Other: None CT CERVICAL SPINE FINDINGS Alignment: 3 mm anterolisthesis C5-6. 6 mm anterolisthesis C6-7. 3 mm anterolisthesis C7-T1. Alignment is similar to the prior CT 11/07/2020 Skull base and vertebrae: Negative for fracture Soft tissues and spinal canal: Negative for soft tissue mass or swelling. Disc levels: Disc degeneration facet degeneration throughout the cervical spine. ACDF with solid fusion at C4-5. Upper chest: No acute abnormality Other: None IMPRESSION: 1. No acute intracranial abnormality 2. Negative for cervical  spine fracture 3. Advanced spondylosis and spondylolisthesis. Electronically Signed   By:  Franchot Gallo M.D.   On: 05/24/2021 10:39   CT ABDOMEN PELVIS W CONTRAST  Result Date: 05/24/2021 CLINICAL DATA:  60 year old female with chest pain, recent fall. Decreased lung sounds on the right. EXAM: CT ABDOMEN AND PELVIS WITH CONTRAST TECHNIQUE: Multidetector CT imaging of the abdomen and pelvis was performed using the standard protocol following bolus administration of intravenous contrast. CONTRAST:  113m OMNIPAQUE IOHEXOL 350 MG/ML SOLN COMPARISON:  CTA chest today. CT Chest, Abdomen, and Pelvis 05/12/2021. Lumbar spine CT today reported separately. FINDINGS: Lower chest: Reported separately today. Hepatobiliary: Stable since 2019, negative. Pancreas: Negative. Spleen: Negative. Adrenals/Urinary Tract: Normal adrenal glands. Punctate nephrolithiasis on the left. Nonobstructed kidneys with symmetric renal enhancement and contrast excretion. Negative ureters and bladder. Stomach/Bowel: Negative rectum. Coarsely calcified perirectal lymph node is small, stable, and likely postinflammatory. Sigmoid and descending diverticulosis without active inflammation. Negative transverse and right colon. Prior appendectomy. Negative terminal ileum. No dilated small bowel. Decompressed stomach and duodenum. No free air or free fluid. Vascular/Lymphatic: Aortoiliac calcified atherosclerosis. Major arterial structures in the abdomen and pelvis remain patent. Portal venous system is patent. No lymphadenopathy. Reproductive: Absent uterus.  Diminutive or absent ovaries. Other: No pelvic free fluid. Musculoskeletal: Lumbar spine reported separately today. Chronic left hip arthroplasty. Chronic right pubic rami fractures. No acute osseous abnormality identified. IMPRESSION: 1. No acute or inflammatory process identified in the abdomen or pelvis. Lumbar spine CT reported separately. 2. Distal large bowel diverticulosis. Punctate nephrolithiasis. Aortic Atherosclerosis (ICD10-I70.0). Electronically Signed   By: HGenevie AnnM.D.    On: 05/24/2021 10:44   CT T-SPINE NO CHARGE  Result Date: 05/24/2021 CLINICAL DATA:  60year old female with pain, recent fall. EXAM: CT THORACIC SPINE WITH CONTRAST TECHNIQUE: Multiplanar CT images of the thoracic spine were reconstructed from contemporary CTA of the Chest. CONTRAST:  No additional COMPARISON:  CT cervical spine and CTA chest today reported separately. Thoracic spine MRI 01/19/2021. FINDINGS: Limited cervical spine imaging: Multilevel lower cervical spondylolisthesis, including 6-7 mm anterolisthesis of C6 on C7, stable since July. Thoracic spine segmentation:  Normal. Alignment: Stable thoracic kyphosis since July. No thoracic spondylolisthesis. Vertebrae: Osteopenia. Thoracic vertebrae appear stable and intact. Rib details are reported separately today. Paraspinal and other soft tissues: Thoracic paraspinal soft tissues are within normal limits. Chest and abdominal viscera are reported separately today. Disc levels: Capacious thoracic spinal canal as seen in July. No CT evidence of spinal stenosis. Chronic thoracic facet hypertrophy, stable since July. IMPRESSION: 1. No acute traumatic injury identified in the thoracic spine. Osteopenia. 2. Severe lower cervical spine spondylolisthesis. See CT cervical spine today reported Separately. 3. Mild for age thoracic spine degeneration is stable since a July MRI. 4. CT Chest, Abdomen, and Pelvis today reported separately. Electronically Signed   By: HGenevie AnnM.D.   On: 05/24/2021 10:50   CT L-SPINE NO CHARGE  Result Date: 05/24/2021 CLINICAL DATA:  60year old female with chest pain, recent fall. Decreased lung sounds on the right. EXAM: CT LUMBAR SPINE WITH CONTRAST TECHNIQUE: Technique: Multiplanar CT images of the lumbar spine were reconstructed from contemporary CT of the Abdomen and Pelvis. CONTRAST:  No additional COMPARISON:  CT Chest, Abdomen, and Pelvis today are reported separately. Lumbar MRI 01/19/2021. CT Abdomen and Pelvis 09/12/2019  FINDINGS: Segmentation: Normal, concordant with the thoracic spine numbering today. Alignment: Lumbar lordosis is stable since July. Anterolisthesis of L3 on L4 is less apparent now. Vertebrae: Osteopenia. Chronic right  L3 transverse process fracture (series 1, image 53) stable since last year. No acute osseous abnormality identified. In the lumbar spine. Visible sacrum and SI joints appear intact. Paraspinal and other soft tissues: Abdominal and pelvic viscera are reported separately. Lumbar paraspinal soft tissues remain normal. Disc levels: Capacious spinal canal at most levels as seen in July. Stable disc bulging at L3-L4 and L4-L5 with up to mild spinal stenosis at the former. IMPRESSION: 1. Osteopenia. No acute traumatic injury identified in the lumbar spine. Chronic right L3 transverse process fracture. 2. Stable mild lumbar spine degeneration since an MRI in July, disc bulging at L3-L4 with up to mild spinal stenosis. 3. CT Abdomen and Pelvis today reported separately. Electronically Signed   By: Genevie Ann M.D.   On: 05/24/2021 10:54   DG Knee Complete 4 Views Left  Result Date: 05/24/2021 CLINICAL DATA:  60 year old female with pain, recent fall. EXAM: LEFT KNEE - COMPLETE 4+ VIEW COMPARISON:  None. FINDINGS: Bone mineralization is within normal limits. No evidence of fracture, dislocation, or joint effusion. No evidence of arthropathy or other focal bone abnormality. No discrete soft tissue injury. IMPRESSION: Negative. Electronically Signed   By: Genevie Ann M.D.   On: 05/24/2021 10:46   DG Knee Complete 4 Views Right  Result Date: 05/24/2021 CLINICAL DATA:  59 year old female with pain, recent fall. EXAM: RIGHT KNEE - COMPLETE 4+ VIEW COMPARISON:  Right tib fib series 03/20/2015. FINDINGS: Bone mineralization is within normal limits. No evidence of fracture, dislocation, or joint effusion. No evidence of arthropathy or other focal bone abnormality. No discrete soft tissue injury. IMPRESSION: Negative.  Electronically Signed   By: Genevie Ann M.D.   On: 05/24/2021 10:46    ____________________________________________   PROCEDURES  Procedure(s) performed (including Critical Care):  .Suture Removal  Date/Time: 05/24/2021 2:56 PM Performed by: Lucrezia Starch, MD Authorized by: Lucrezia Starch, MD   Consent:    Consent obtained:  Verbal   Consent given by:  Patient   Risks, benefits, and alternatives were discussed: yes     Risks discussed:  Pain, bleeding and wound separation   Alternatives discussed:  No treatment Universal protocol:    Procedure explained and questions answered to patient or proxy's satisfaction: yes     Patient identity confirmed:  Verbally with patient Location:    Location:  Trunk   Trunk location:  Chest Procedure details:    Wound appearance:  No signs of infection   Number of sutures removed:  4 Post-procedure details:    Post-removal:  No dressing applied   Procedure completion:  Tolerated well, no immediate complications   ____________________________________________   INITIAL IMPRESSION / ASSESSMENT AND PLAN / ED COURSE      Patient presents with above-stated history and exam primarily complaining of chest pain in the setting of fairly diffuse pain after another fall from when she was recently hospitalized for a fall last month.  She is denying any clear associated sick symptoms but think she may be a little dehydrated.  On arrival she is tachycardic at 112, hypertensive at 150/70 with a stable vital signs on room air.  She has some diffuse skin tears and abrasions of her extremities with the largest of the anterior right shin without any active bleeding.  She is otherwise neurovascular intact in her extremities.  He has fairly diffuse pain along her back and given tachycardia on arrival with a fall and patient reporting she has not been evaluated for this fall and  complaining of fairly severe diffuse pain full trauma scans obtained.  Additional  differential considerations include PE, pneumonia, UTI, arrhythmia, anemia and ACS as etiology of patient's recurrent fall pain and weakness.  ECG and nonelevated troponin x2 are not suggestive of ACS or arrhythmia.  CBC with WBC count of 10.6 without evidence of acute anemia and normal platelets.  BMP remarkable for K3.4 without other significant electrolyte or metabolic arrangements.  Hepatic function panel is unremarkable.  Procalcitonin is undetectable.  Lactic acid initially elevated 2.3 which down trended to 1.9 after some IV fluids.  Urinalysis without evidence of infection or blood.  COVID influenza PCR is negative.  CT head without evidence of skull fracture, intracranial hemorrhage, ischemia or other acute intracranial process.  CT C-spine shows no acute cervical spine or or significant traumatic findings but there is advanced spondylosis and some spondylolisthesis.  CTA chest has no evidence of PE, pneumonia, large effusion or other clear acute process.  Previously noted mildly displaced right seventh and eighth rib fractures are again noted and some very old chronic bilateral fractures.  Reformats of the T and L-spine showed no acute T or L-spine fractures.  No visceral injuries identified on CT of the abdomen pelvis.  There is evidence of some diverticulosis and small kidney stones as well as aortic atherosclerosis but no evidence of an SBO, diverticulitis, hematoma, perinephric stranding or any other acute intra-abdominal pelvic process.  Chest x-ray shows previously noted right seventh and eighth rib fractures without any other clear acute process  Plain film of the right tib-fib shows no acute fracture dislocation.    Plain film of the right knee shows no acute fracture dislocation.    Plain film of the left knee shows no acute fracture or dislocation.  Sutures from right chest wound previously repaired removed and has been about 7 days and wound appears clean dry and intact without  evidence of infection.  Seems that this was opened rib fracture and patient states she has completed her antibiotics.  On reassessment she states she is feeling a little better but is still tachycardic complaining of significant chest pain.  I suspect ongoing pain issues related to recent rib fractures and possible additional chest contusions or pleurisy from recurrent fall.  Given she has still significant discomfort and tachycardia after some fluids and initial dose of Percocet and morphine will admit for pain control and hydration.  We will also be reasonable to further work-up patient's recurrent falls as there is no clear etiology for this and certainly possible she has syncopized.  I will admit to medicine service for further evaluation and management.      ____________________________________________   FINAL CLINICAL IMPRESSION(S) / ED DIAGNOSES  Final diagnoses:  Fall  Chest pain, unspecified type  Hypokalemia  Dehydration  Closed fracture of multiple ribs of right side with routine healing, subsequent encounter  Multiple skin tears    Medications  lactated ringers infusion ( Intravenous Rate/Dose Change 05/24/21 1339)  acetaminophen (TYLENOL) tablet 650 mg (has no administration in time range)  celecoxib (CELEBREX) capsule 200 mg (has no administration in time range)  morphine 4 MG/ML injection 4 mg (has no administration in time range)  potassium chloride SA (KLOR-CON) CR tablet 40 mEq (40 mEq Oral Given 05/24/21 1005)  morphine 4 MG/ML injection 6 mg (6 mg Intravenous Given 05/24/21 0950)  ondansetron (ZOFRAN) injection 4 mg (4 mg Intravenous Given 05/24/21 1004)  iohexol (OMNIPAQUE) 350 MG/ML injection 100 mL (100 mLs Intravenous Contrast Given  05/24/21 0948)  oxyCODONE-acetaminophen (PERCOCET/ROXICET) 5-325 MG per tablet 1 tablet (1 tablet Oral Given 05/24/21 1221)  lactated ringers bolus 500 mL (0 mLs Intravenous Stopped 05/24/21 1334)     ED Discharge Orders     None         Note:  This document was prepared using Dragon voice recognition software and may include unintentional dictation errors.    Lucrezia Starch, MD 05/24/21 [phone]

## 2021-05-24 NOTE — ED Notes (Signed)
See triage note  presents with pain to chest  states she took a fall and landed face first  has old bruising noted to arms and legs  skin tear to right lower leg

## 2021-05-24 NOTE — ED Notes (Signed)
Patient ambulated to hallway bathroom with one assist. 

## 2021-05-24 NOTE — ED Notes (Signed)
Skin tear on right lower leg was cleaned, covered with a non-adherent pad and wrapped with kling per Dr. Tamala Julian. Wound was dry, redness noted in surrounding tissues.

## 2021-05-24 NOTE — H&P (Signed)
[facility] History and Physical  [name]:[id number] DOB: 18-Dec-1960 DOA: 05/24/2021  Referring physician: Dr. Tamala Julian PCP: Nona Dell, Corene Cornea, MD   Chief Complaint: chest pain  HPI: [name] is a 60 y.o. female with history of COPD, chronic diastolic CHF, type 2 diabetes with recently normal A1c, Lewy body dementia in early stages, cervical radiculopathy, adrenal insufficiency, and recent admission for fall with rib fracture who presents with chest pain and ongoing falls.  Patient was recently admitted from October 25 to October 28.  Presented at that time with a fall and chest wall laceration.  She was seen by PT and discharged home with home PT.  Patient reports that for about the past day or so she has been experiencing increased falls like her legs are giving out as well as chest pain.  Feels like she will be walking fine and all of a sudden her legs will give out.  Denies any loss of consciousness, dizziness, or other preceding symptoms, but has been feeling diffuse muscle pain throughout her body.  As for the chest pain it is constant, centrally located without radiation, worse with deep inspiration and with palpation.  She reports it is a burning-like sensation.  Patient also concerned about skin tear on her right shin.  She reports the area around it has become increasingly more red and she is worried it is now infected.  In the ED initial vital signs notable for mild intermittent hypertension and mild tachycardia to the low 100s, other vital signs normal.  Subsequently underwent extensive laboratory and imaging work-up that was largely unremarkable.  CMP and CBC without any significant findings, serial troponins were negative, CK was normal, procalcitonin was negative.  COVID and influenza swab was negative.  Lactic acid was initially mildly elevated 2.3 and down trended to 1.9 after fluids.  UA was notable for specific gravity of greater than 1.046, but no signs of  infection with negative nitrite and leukocytes.  She had a chest x-ray that showed no change in findings of her rib fracture.  Plain films of the right and left knees as well as the right tibia/fibula were negative for any acute findings.  CT head and cervical spine without contrast showed no acute findings.  CTPA as well as CT abdomen pelvis with contrast also showed no acute findings.  Review of Systems:  Pertinent positives and negative per HPI, all others reviewed and negative  Past Medical History:  Diagnosis Date   Adrenal insufficiency (HCC)    Anxiety    CAD (coronary artery disease)    Cancer (HCC)    skin   Chronic diastolic CHF (congestive heart failure) (HCC)    Chronic pain    COPD (chronic obstructive pulmonary disease) (HCC)    Cushing disease (HCC)    Dementia (HCC)    GERD (gastroesophageal reflux disease)    HLD (hyperlipidemia)    Past Surgical History:  Procedure Laterality Date   APPENDECTOMY     HIP CLOSED REDUCTION Left 12/06/2019   Procedure: CLOSED MANIPULATION HIP;  Surgeon: Lovell Sheehan, MD;  Location: [facility] ORS;  Service: Orthopedics;  Laterality: Left;   NASAL SEPTUM SURGERY     NASAL SINUS SURGERY     UMBILICAL HERNIA REPAIR     VAGINAL HYSTERECTOMY     Social History:  reports that she has been smoking cigarettes. She has been smoking an average of .5 packs per day. She has never used smokeless tobacco. She reports current alcohol  use. She reports that she does not use drugs.  Allergies  Allergen Reactions   Bupropion Other (See Comments), Nausea Only, Shortness Of Breath and Nausea And Vomiting    cold sweats, extended release, only can take [employer] brand Issues with extended release medications. Other reaction(s): Unknown ER- cold sweats, extended release, only can take [employer] brand Issues with extended release medications.   Levaquin [Levofloxacin In D5w] Anaphylaxis   Methylprednisolone Acetate Other (See Comments)   Amoxicillin Itching    Codeine Nausea And Vomiting   Cymbalta [Duloxetine Hcl] Swelling   Gatifloxacin Hives and Swelling    Other Reaction: Other reaction: Swelling   Guaifenesin & Derivatives Hives and Swelling   Keflex [Cephalexin] Other (See Comments)    Unknown   Klonopin [Clonazepam] Other (See Comments)    "Makes me have bad thoughts"   Macrobid [Nitrofurantoin Monohyd Macro] Other (See Comments)    "kidneys shuts down" Chills, Fever.   Penicillins Hives    Has patient had a PCN reaction causing immediate rash, facial/tongue/throat swelling, SOB or lightheadedness with hypotension: Yes Has patient had a PCN reaction causing severe rash involving mucus membranes or skin necrosis: No Has patient had a PCN reaction that required hospitalization: No Has patient had a PCN reaction occurring within the last 10 years: No If all of the above answers are "NO", then may proceed with Cephalosporin use.   Sulfa Antibiotics Hives   Amitriptyline Rash    "It makes me want to fall."   Other Rash    cortisol    Family History  Problem Relation Age of Onset   Ovarian cancer Mother    Breast cancer Mother    Brain cancer Mother    CAD Father    Diabetes Father    Hepatitis Father    Liver disease Father    Cancer Father    Alcohol abuse Father    Heart attack Brother    Sleep apnea Brother    Diabetes Brother    ALS Paternal Grandmother    Stroke Maternal Grandmother      Prior to Admission medications   Medication Sig Start Date End Date Taking? Authorizing Provider  albuterol (VENTOLIN HFA) 108 (90 Base) MCG/ACT inhaler Inhale 2 puffs into the lungs every 6 (six) hours as needed. 06/04/15   [provider]  ALPRAZolam Duanne Moron) 1 MG tablet Take 1 tablet (1 mg total) by mouth 4 (four) times daily as needed for anxiety (home med). 05/16/21   Enzo Bi, MD  benzonatate (TESSALON) 200 MG capsule Take by mouth. 03/01/20   [provider]  butalbital-acetaminophen-caffeine (FIORICET)  50-325-40 MG tablet Take 2 tablets by mouth daily as needed for headache. 05/16/21   Enzo Bi, MD  Calcium Carbonate-Vitamin D 600-400 MG-UNIT tablet Take 1 tablet by mouth daily. 05/22/15 09/07/17  [provider]  cyclobenzaprine (FLEXERIL) 10 MG tablet Take 10 mg by mouth 3 (three) times daily as needed.     [provider]  donepezil (ARICEPT) 10 MG tablet Take 10 mg by mouth at bedtime.    [provider]  EPINEPHrine 0.3 mg/0.3 mL IJ SOAJ injection Inject 0.3 mg into the muscle once. 04/01/18   [provider]  fluticasone (FLONASE) 50 MCG/ACT nasal spray Place 2 sprays into the nose daily. 01/02/13   [provider]  Fluticasone-Salmeterol (ADVAIR DISKUS) 250-50 MCG/DOSE AEPB Inhale 1 puff into the lungs every 12 (twelve) hours. 06/04/15 09/07/17  [provider]  furosemide (LASIX) 80 MG tablet Take  by mouth. 03/01/20   [provider]  gabapentin (NEURONTIN) 600 MG tablet Take 1 tablet by mouth 3 (three) times daily.    [provider]  hydrOXYzine (ATARAX/VISTARIL) 25 MG tablet Take 25 mg by mouth 3 (three) times daily as needed for itching. 03/23/18   [provider]  magnesium oxide (MAG-OX) 400 MG tablet Take 400 mg by mouth daily.    [provider]  meclizine (ANTIVERT) 25 MG tablet Take 1 tablet (25 mg total) by mouth 3 (three) times daily as needed for dizziness. 09/14/19   Danford, Suann Larry, MD  NARCAN 4 MG/0.1ML LIQD nasal spray kit SMARTSIG:Both Nares 03/01/20   [provider]  omeprazole (PRILOSEC) 40 MG capsule Take 1 capsule by mouth 2 (two) times daily. 02/19/15 09/07/17  [provider]  ondansetron (ZOFRAN-ODT) 4 MG disintegrating tablet Take 1 tablet (4 mg total) by mouth every 8 (eight) hours as needed for nausea or vomiting. 09/14/19   Danford, Suann Larry, MD  oxyCODONE-acetaminophen (PERCOCET) 10-325 MG tablet Take 1 tablet by mouth every 4 (four) hours as needed for  pain (pt has to take [facility] brand).  10/05/12   [provider]  potassium chloride SA (K-DUR,KLOR-CON) 20 MEQ tablet Take 1 tablet (20 mEq total) by mouth 2 (two) times daily. 09/09/17   Epifanio Lesches, MD  predniSONE (DELTASONE) 5 MG tablet Take 15 mg by mouth daily with breakfast.    [provider]  PREMARIN vaginal cream Place vaginally. 03/01/20   [provider]  promethazine (PHENERGAN) 25 MG tablet Take 1 tablet (25 mg total) by mouth every 8 (eight) hours as needed for nausea or vomiting. 09/14/19   Danford, Suann Larry, MD  traZODone (DESYREL) 100 MG tablet Take 100 mg by mouth at bedtime. 02/12/21   [provider]   Physical Exam: Vitals:   05/24/21 1200 05/24/21 1400 05/24/21 1500 05/24/21 1530  BP: 137/87 139/80 (!) 142/87   Pulse: (!) 110 (!) 107 (!) 108 (!) 106  Resp:    (!) 21  Temp:      TempSrc:      SpO2: 95% 92% 100% 93%  Weight:      Height:        Wt Readings from Last 3 Encounters:  05/24/21 68 kg  05/12/21 68 kg  04/11/21 68 kg     General:  Appears calm, no acute distress Eyes: PERRL, normal lids, irises & conjunctiva ENT: grossly normal hearing, lips & tongue Neck: no masses Cardiovascular: RRR, no m/r/g. No LE edema. Respiratory: CTA bilaterally, no w/r/r. Normal respiratory effort. Abdomen: soft, ntnd Skin: large skin tear on anterior R skin with notable surrounding erythema and warmth to the touch, see pictures below Musculoskeletal: grossly normal tone BUE/BLE Psychiatric: grossly normal mood and affect, speech fluent and appropriate Neurologic: grossly non-focal.               Labs on Admission:  Basic Metabolic Panel: Recent Labs  Lab 05/24/21 0635 05/24/21 0910  NA 138  --   K 3.4*  --   CL 96*  --   CO2 31  --   GLUCOSE 140*  --   BUN 16  --   CREATININE 0.87  --   CALCIUM 9.1  --   MG  --  2.1   Liver Function Tests: Recent Labs  Lab 05/24/21 0910  AST 23  ALT 26   ALKPHOS 93  BILITOT 0.8  PROT 7.0  ALBUMIN 3.5  No results for input(s): LIPASE, AMYLASE in the last 168 hours. No results for input(s): AMMONIA in the last 168 hours. CBC: Recent Labs  Lab 05/24/21 0635  WBC 10.6*  HGB 13.4  HCT 42.4  MCV 95.3  PLT 350   Cardiac Enzymes: Recent Labs  Lab 05/24/21 0910  CKTOTAL 27*    BNP (last 3 results) Recent Labs    05/13/21 0758  BNP 22.9    ProBNP (last 3 results) No results for input(s): PROBNP in the last 8760 hours.  CBG: No results for input(s): GLUCAP in the last 168 hours.  Radiological Exams on Admission: DG Chest 2 View  Result Date: 05/24/2021 CLINICAL DATA:  Central chest pain and pleuritic chest pain. Recent fall with right rib fractures by CT. EXAM: CHEST - 2 VIEW COMPARISON:  CT of the chest on 05/12/2021 FINDINGS: The heart size and mediastinal contours are within normal limits. There is no evidence of pulmonary edema, consolidation, pneumothorax, nodule or pleural fluid. The acute minimally displaced fractures of the lateral right seventh and eighth ribs seen by CT are difficult to appreciate by x-ray but show no evidence of significant displacement. Several old left-sided rib fractures are noted at the level of the fifth, sixth and eighth ribs. IMPRESSION: Recent lateral right seventh and eighth rib fractures are difficult to see by chest x-ray but show no significant displacement. No associated pneumothorax or pleural fluid. Old left-sided rib fractures again noted. Electronically Signed   By: Aletta Edouard M.D.   On: 05/24/2021 08:38   DG Tibia/Fibula Right  Result Date: 05/24/2021 CLINICAL DATA:  Fall EXAM: RIGHT TIBIA AND FIBULA - 2 VIEW COMPARISON:  None. FINDINGS: There is no evidence of fracture or other focal bone lesions. Soft tissues are unremarkable. IMPRESSION: Negative. Electronically Signed   By: Franchot Gallo M.D.   On: 05/24/2021 10:27   CT HEAD WO CONTRAST (5MM)  Result Date:  05/24/2021 CLINICAL DATA:  Fall EXAM: CT HEAD WITHOUT CONTRAST CT CERVICAL SPINE WITHOUT CONTRAST TECHNIQUE: Multidetector CT imaging of the head and cervical spine was performed following the standard protocol without intravenous contrast. Multiplanar CT image reconstructions of the cervical spine were also generated. COMPARISON:  CT head 05/12/2021 FINDINGS: CT HEAD FINDINGS Brain: No evidence of acute infarction, hemorrhage, hydrocephalus, extra-axial collection or mass lesion/mass effect. Mild atrophy Vascular: Negative for hyperdense vessel Skull: Negative for fracture Sinuses/Orbits: Prior sinus surgery. Paranasal sinuses clear. Negative orbit Other: None CT CERVICAL SPINE FINDINGS Alignment: 3 mm anterolisthesis C5-6. 6 mm anterolisthesis C6-7. 3 mm anterolisthesis C7-T1. Alignment is similar to the prior CT 11/07/2020 Skull base and vertebrae: Negative for fracture Soft tissues and spinal canal: Negative for soft tissue mass or swelling. Disc levels: Disc degeneration facet degeneration throughout the cervical spine. ACDF with solid fusion at C4-5. Upper chest: No acute abnormality Other: None IMPRESSION: 1. No acute intracranial abnormality 2. Negative for cervical spine fracture 3. Advanced spondylosis and spondylolisthesis. Electronically Signed   By: Franchot Gallo M.D.   On: 05/24/2021 10:39   CT Angio Chest PE W and/or Wo Contrast  Result Date: 05/24/2021 CLINICAL DATA:  60 year old female with chest pain, recent fall. Decreased lung sounds on the right. EXAM: CT ANGIOGRAPHY CHEST WITH CONTRAST TECHNIQUE: Multidetector CT imaging of the chest was performed using the standard protocol during bolus administration of intravenous contrast. Multiplanar CT image reconstructions and MIPs were obtained to evaluate the vascular anatomy. CONTRAST:  139m OMNIPAQUE IOHEXOL 350 MG/ML SOLN COMPARISON:  CT Chest, Abdomen, and  Pelvis 05/12/2021. Thoracic and lumbar spine CT today reported separately. FINDINGS:  Cardiovascular: Good contrast bolus timing in the pulmonary arterial tree. No focal filling defect identified in the pulmonary arteries to suggest acute pulmonary embolism. No cardiomegaly or pericardial effusion. Negative thoracic aorta aside from mild calcified atherosclerosis. Mediastinum/Nodes: Negative. No mediastinal hematoma or lymphadenopathy. Lungs/Pleura: No pleural effusion. No pneumothorax. Lower lung volumes compared to last month. Major airways remain patent. Mild dependent atelectasis. No pulmonary contusion or other confluent opacity. Upper Abdomen: Negative visible liver, gallbladder, spleen, pancreas, adrenal glands and bowel in the upper abdomen. Mild nephrolithiasis. Musculoskeletal: Thoracic spine detailed separately. There is advanced lower cervical spondylolisthesis superimposed on a previous C4-C5 (or C3 through C5) ACDF. Osteopenia. Mildly displaced right lateral 7th and 8th rib fractures again noted. There are superimposed chronic right lateral 5, 6, 9 and 10 rib fractures. No new right rib fracture compared to last month. And right lateral chest wall soft tissue swelling situated between the lateral 7th and 8th ribs has decreased compatible with resolving soft tissue hematoma (series 4, image 67 today). No acute left rib fracture. Chronic left 4th and 5th rib fractures. Intact sternum. Review of the MIP images confirms the above findings. IMPRESSION: 1. No evidence of acute pulmonary embolus. 2. Lower lung volumes compared to last month with mild atelectasis. Mildly displaced right lateral 7th and 8th rib fractures again noted. Underlying chronic bilateral rib fractures. 3. No new traumatic injury identified in the chest. Thoracic Spine CT reported separately. Advanced lower cervical spondylolisthesis superimposed on previous ACDF. Electronically Signed   By: Genevie Ann M.D.   On: 05/24/2021 10:40   CT Cervical Spine Wo Contrast  Result Date: 05/24/2021 CLINICAL DATA:  Fall EXAM: CT HEAD  WITHOUT CONTRAST CT CERVICAL SPINE WITHOUT CONTRAST TECHNIQUE: Multidetector CT imaging of the head and cervical spine was performed following the standard protocol without intravenous contrast. Multiplanar CT image reconstructions of the cervical spine were also generated. COMPARISON:  CT head 05/12/2021 FINDINGS: CT HEAD FINDINGS Brain: No evidence of acute infarction, hemorrhage, hydrocephalus, extra-axial collection or mass lesion/mass effect. Mild atrophy Vascular: Negative for hyperdense vessel Skull: Negative for fracture Sinuses/Orbits: Prior sinus surgery. Paranasal sinuses clear. Negative orbit Other: None CT CERVICAL SPINE FINDINGS Alignment: 3 mm anterolisthesis C5-6. 6 mm anterolisthesis C6-7. 3 mm anterolisthesis C7-T1. Alignment is similar to the prior CT 11/07/2020 Skull base and vertebrae: Negative for fracture Soft tissues and spinal canal: Negative for soft tissue mass or swelling. Disc levels: Disc degeneration facet degeneration throughout the cervical spine. ACDF with solid fusion at C4-5. Upper chest: No acute abnormality Other: None IMPRESSION: 1. No acute intracranial abnormality 2. Negative for cervical spine fracture 3. Advanced spondylosis and spondylolisthesis. Electronically Signed   By: Franchot Gallo M.D.   On: 05/24/2021 10:39   CT ABDOMEN PELVIS W CONTRAST  Result Date: 05/24/2021 CLINICAL DATA:  60 year old female with chest pain, recent fall. Decreased lung sounds on the right. EXAM: CT ABDOMEN AND PELVIS WITH CONTRAST TECHNIQUE: Multidetector CT imaging of the abdomen and pelvis was performed using the standard protocol following bolus administration of intravenous contrast. CONTRAST:  11m OMNIPAQUE IOHEXOL 350 MG/ML SOLN COMPARISON:  CTA chest today. CT Chest, Abdomen, and Pelvis 05/12/2021. Lumbar spine CT today reported separately. FINDINGS: Lower chest: Reported separately today. Hepatobiliary: Stable since 2019, negative. Pancreas: Negative. Spleen: Negative.  Adrenals/Urinary Tract: Normal adrenal glands. Punctate nephrolithiasis on the left. Nonobstructed kidneys with symmetric renal enhancement and contrast excretion. Negative ureters and bladder. Stomach/Bowel: Negative rectum.  Coarsely calcified perirectal lymph node is small, stable, and likely postinflammatory. Sigmoid and descending diverticulosis without active inflammation. Negative transverse and right colon. Prior appendectomy. Negative terminal ileum. No dilated small bowel. Decompressed stomach and duodenum. No free air or free fluid. Vascular/Lymphatic: Aortoiliac calcified atherosclerosis. Major arterial structures in the abdomen and pelvis remain patent. Portal venous system is patent. No lymphadenopathy. Reproductive: Absent uterus.  Diminutive or absent ovaries. Other: No pelvic free fluid. Musculoskeletal: Lumbar spine reported separately today. Chronic left hip arthroplasty. Chronic right pubic rami fractures. No acute osseous abnormality identified. IMPRESSION: 1. No acute or inflammatory process identified in the abdomen or pelvis. Lumbar spine CT reported separately. 2. Distal large bowel diverticulosis. Punctate nephrolithiasis. Aortic Atherosclerosis (ICD10-I70.0). Electronically Signed   By: Genevie Ann M.D.   On: 05/24/2021 10:44   CT T-SPINE NO CHARGE  Result Date: 05/24/2021 CLINICAL DATA:  60 year old female with pain, recent fall. EXAM: CT THORACIC SPINE WITH CONTRAST TECHNIQUE: Multiplanar CT images of the thoracic spine were reconstructed from contemporary CTA of the Chest. CONTRAST:  No additional COMPARISON:  CT cervical spine and CTA chest today reported separately. Thoracic spine MRI 01/19/2021. FINDINGS: Limited cervical spine imaging: Multilevel lower cervical spondylolisthesis, including 6-7 mm anterolisthesis of C6 on C7, stable since July. Thoracic spine segmentation:  Normal. Alignment: Stable thoracic kyphosis since July. No thoracic spondylolisthesis. Vertebrae: Osteopenia.  Thoracic vertebrae appear stable and intact. Rib details are reported separately today. Paraspinal and other soft tissues: Thoracic paraspinal soft tissues are within normal limits. Chest and abdominal viscera are reported separately today. Disc levels: Capacious thoracic spinal canal as seen in July. No CT evidence of spinal stenosis. Chronic thoracic facet hypertrophy, stable since July. IMPRESSION: 1. No acute traumatic injury identified in the thoracic spine. Osteopenia. 2. Severe lower cervical spine spondylolisthesis. See CT cervical spine today reported Separately. 3. Mild for age thoracic spine degeneration is stable since a July MRI. 4. CT Chest, Abdomen, and Pelvis today reported separately. Electronically Signed   By: Genevie Ann M.D.   On: 05/24/2021 10:50   CT L-SPINE NO CHARGE  Result Date: 05/24/2021 CLINICAL DATA:  60 year old female with chest pain, recent fall. Decreased lung sounds on the right. EXAM: CT LUMBAR SPINE WITH CONTRAST TECHNIQUE: Technique: Multiplanar CT images of the lumbar spine were reconstructed from contemporary CT of the Abdomen and Pelvis. CONTRAST:  No additional COMPARISON:  CT Chest, Abdomen, and Pelvis today are reported separately. Lumbar MRI 01/19/2021. CT Abdomen and Pelvis 09/12/2019 FINDINGS: Segmentation: Normal, concordant with the thoracic spine numbering today. Alignment: Lumbar lordosis is stable since July. Anterolisthesis of L3 on L4 is less apparent now. Vertebrae: Osteopenia. Chronic right L3 transverse process fracture (series 1, image 53) stable since last year. No acute osseous abnormality identified. In the lumbar spine. Visible sacrum and SI joints appear intact. Paraspinal and other soft tissues: Abdominal and pelvic viscera are reported separately. Lumbar paraspinal soft tissues remain normal. Disc levels: Capacious spinal canal at most levels as seen in July. Stable disc bulging at L3-L4 and L4-L5 with up to mild spinal stenosis at the former.  IMPRESSION: 1. Osteopenia. No acute traumatic injury identified in the lumbar spine. Chronic right L3 transverse process fracture. 2. Stable mild lumbar spine degeneration since an MRI in July, disc bulging at L3-L4 with up to mild spinal stenosis. 3. CT Abdomen and Pelvis today reported separately. Electronically Signed   By: Genevie Ann M.D.   On: 05/24/2021 10:54   DG Knee Complete 4 Views Left  Result Date: 05/24/2021 CLINICAL DATA:  60 year old female with pain, recent fall. EXAM: LEFT KNEE - COMPLETE 4+ VIEW COMPARISON:  None. FINDINGS: Bone mineralization is within normal limits. No evidence of fracture, dislocation, or joint effusion. No evidence of arthropathy or other focal bone abnormality. No discrete soft tissue injury. IMPRESSION: Negative. Electronically Signed   By: Genevie Ann M.D.   On: 05/24/2021 10:46   DG Knee Complete 4 Views Right  Result Date: 05/24/2021 CLINICAL DATA:  60 year old female with pain, recent fall. EXAM: RIGHT KNEE - COMPLETE 4+ VIEW COMPARISON:  Right tib fib series 03/20/2015. FINDINGS: Bone mineralization is within normal limits. No evidence of fracture, dislocation, or joint effusion. No evidence of arthropathy or other focal bone abnormality. No discrete soft tissue injury. IMPRESSION: Negative. Electronically Signed   By: Genevie Ann M.D.   On: 05/24/2021 10:46    EKG: Independently reviewed.  Sinus tachycardia, no ischemic changes, unremarkable.  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 female with history of COPD, chronic diastolic CHF, type 2 diabetes with recently normal A1c, Lewy body dementia  in early stages, cervical radiculopathy, adrenal insufficiency, and recent admission for fall with rib fracture who presents with chest pain and ongoing falls is also found to have new cellulitis of the right lower extremity.  #Frequent falls Patient presenting for second time in the past 2 weeks for frequent falls, based on history appear to be mechanical.  May be due to advancing Lewy body dementia.  Also has acute right lower extremity cellulitis although this would not explain her first presentation.  Will eval for neuropathy and other low likelihood but easily treatable conditions. - PT/OT/transitions of care consults - Check B12, RPR, TSH  #Chest pain EKG without ischemic changes, serial troponins were negative.  History is atypical in nature.  Some elements more consistent with GERD, though patient reports compliance with her home PPI.  Continue to monitor.  #Cellulitis of right lower extremity Nonpurulent cellulitis present on exam, point of entry obviously skin tear suffered in one of her falls.  Has allergies to penicillins, ordered for clindamycin but patient declined, switched to doxycycline.  Reference pictures above to follow clinically.  #Chronic medical problems COPD-continue albuterol, Advair  Anxiety-continue Xanax 1 mg 4 times daily as needed, hydroxyzine, trazodone  Muscle spasms-continue Flexeril 10 mg 3 times daily as needed  Lewy body dementia-continue donepezil  Chronic diastolic CHF-continue Lasix  Neuropathy-continue gabapentin  GERD-continue omeprazole  Chronic pain-continue Percocet 10 mg every 4 hours as needed  Adrenal insufficiency-continue prednisone 15 mg daily   Code Status: DNR/DNI, confirmed DVT Prophylaxis: Lovenox Family Communication: none Disposition Plan: Inpatient, Med-Surg   Time spent: 68 min  Clarnce Flock MD/[facility]  Note:  This document was prepared using Systems analyst and may include  unintentional dictation errors.

## 2021-05-25 DIAGNOSIS — F0390 Unspecified dementia without behavioral disturbance: Secondary | ICD-10-CM

## 2021-05-25 LAB — BASIC METABOLIC PANEL
Anion gap: 7 (ref 5–15)
BUN: 12 mg/dL (ref 6–20)
CO2: 27 mmol/L (ref 22–32)
Calcium: 8.8 mg/dL — ABNORMAL LOW (ref 8.9–10.3)
Chloride: 104 mmol/L (ref 98–111)
Creatinine, Ser: 0.88 mg/dL (ref 0.44–1.00)
GFR, Estimated: >60 mL/min (ref >60)
Glucose, Bld: 114 mg/dL — ABNORMAL HIGH (ref 70–99)
Potassium: 4.1 mmol/L (ref 3.5–5.1)
Sodium: 138 mmol/L (ref 135–145)

## 2021-05-25 LAB — CBC
HCT: 35.4 % — ABNORMAL LOW (ref 36.0–46.0)
Hemoglobin: 11.1 g/dL — ABNORMAL LOW (ref 12.0–15.0)
MCH: 30.1 pg (ref 26.0–34.0)
MCHC: 31.4 g/dL (ref 30.0–36.0)
MCV: 95.9 fL (ref 80.0–100.0)
Platelets: 286 10*3/uL (ref 150–400)
RBC: 3.69 MIL/uL — ABNORMAL LOW (ref 3.87–5.11)
RDW: 14.7 % (ref 11.5–15.5)
WBC: 8.8 10*3/uL (ref 4.0–10.5)
nRBC: 0 % (ref 0.0–0.2)

## 2021-05-25 LAB — TSH: TSH: 1.986 u[IU]/mL (ref 0.350–4.500)

## 2021-05-25 LAB — RPR: RPR Ser Ql: NONREACTIVE

## 2021-05-25 LAB — VITAMIN B12: Vitamin B-12: 586 pg/mL (ref 180–914)

## 2021-05-25 MED ORDER — ENOXAPARIN SODIUM 40 MG/0.4ML IJ SOSY
40.0000 mg | PREFILLED_SYRINGE | INTRAMUSCULAR | Status: DC
  Administered 2021-05-25: 40 mg via SUBCUTANEOUS
  Filled 2021-05-25: qty 0.4

## 2021-05-25 MED ORDER — DOXYCYCLINE HYCLATE 100 MG PO TABS
100.0000 mg | ORAL_TABLET | Freq: Two times a day (BID) | ORAL | Status: DC
  Administered 2021-05-25 – 2021-05-26 (×2): 100 mg via ORAL
  Filled 2021-05-25 (×2): qty 1

## 2021-05-25 MED ORDER — ENOXAPARIN SODIUM 40 MG/0.4ML IJ SOSY: 40.0000 mg | PREFILLED_SYRINGE | INTRAMUSCULAR | Status: DC

## 2021-05-25 NOTE — Evaluation (Signed)
Occupational Therapy Evaluation Patient Details Name: [name] MRN: [id number] DOB: January 12, 1961 Today's Date: 05/25/2021   History of Present Illness Patient is a 60 year old female who presents to hospital after feeling like her legs are giving out, increased falls, and chest pain. Patient recently admitted from October 25th to October 28 with a fall and chest wall laceration. PMH includes COPD, CHF,Type II DM, Lewy Body dementia, cervical radiculopathy, adrenal insufficiency, rib fracture from recent fall. Patient was found to have new cellulitis of RLE.   Clinical Impression   Pt seen for OT evaluation this date in setting of presenting to hospital with multiple falls. Pt reports that she was living alone, but has just recently had someone move in with her to supervise and keep patient safe. Pt reports using RW for fxl mobility at baseline and reports being able to perform all ADLs herself. States that she has recently had an increased struggle with IADLs d/t her legs intermittently giving way. Pt presents this date with decreased balance and activity tolerance. She is able to CTS with CGA with RW and demos G static standing balance, but is noted to fatigue and weaken easily with steps. Pt has good functional UE strength and sensation. She endorses decreased sensation to her feet. She requires SETUP for seated UB ADLs and MIN/MOD A for seated LB ADLs. OT will continue to follow acutely to improve safety and INDEP with ADLs. Anticipate pt will require HHOT f/u and near 24/7 SUPV for safety.      Recommendations for follow up therapy are one component of a multi-disciplinary discharge planning process, led by the attending physician.  Recommendations may be updated based on patient status, additional functional criteria and insurance authorization.   Follow Up Recommendations  Home health OT    Assistance Recommended at Discharge Frequent or constant Supervision/Assistance  Functional Status  Assessment  Patient has had a recent decline in their functional status and demonstrates the ability to make significant improvements in function in a reasonable and predictable amount of time.  Equipment Recommendations  BSC;Tub/shower seat    Recommendations for Other Services       Precautions / Restrictions Precautions Precautions: Fall Precaution Comments: rib fractures, hx of needing a cervical collar. Restrictions Weight Bearing Restrictions: No      Mobility Bed Mobility Overal bed mobility: Modified Independent             General bed mobility comments: no physical assistance provided    Transfers Overall transfer level: Needs assistance Equipment used: Rolling walker (2 wheels) Transfers: Sit to/from Stand Sit to Stand: Min guard           General transfer comment: CGA and cues for hand placement, demos good control but some impulsivitty.      Balance Overall balance assessment: Needs assistance;History of Falls Sitting-balance support: Feet supported Sitting balance-Leahy Scale: Good     Standing balance support: Bilateral upper extremity supported;During functional activity Standing balance-Leahy Scale: Fair Standing balance comment: close SBA with static standing, CGA for fxl mobility, intermittent tremors                           ADL either performed or assessed with clinical judgement   ADL Overall ADL's : Needs assistance/impaired  General ADL Comments: SETUP to INDEP for seated UB ADLs, MIN/MOD A for LB ADLs, interested in learning about AE. CGA for ADL transfers and fxl mobility with RW.     Vision Patient Visual Report: No change from baseline       Perception     Praxis      Pertinent Vitals/Pain Pain Assessment: 0-10 Pain Score: 6  Pain Location: total body Pain Descriptors / Indicators: Aching Pain Intervention(s): Limited activity within patient's  tolerance;Monitored during session     Hand Dominance Right   Extremity/Trunk Assessment Upper Extremity Assessment Upper Extremity Assessment: Overall WFL for tasks assessed;Generalized weakness (ROM WFL, MMT of shoulder not assessed to not place pressue on neck/cervical region. Grip MMT grossly 4-/5)   Lower Extremity Assessment Lower Extremity Assessment: Defer to PT evaluation;Generalized weakness (ROM of R LE slightly better than L LE for LB ADLs. Pt warns therapist that legs will randomly give way w/o warning.)   Cervical / Trunk Assessment Cervical / Trunk Assessment: Normal   Communication Communication Communication: No difficulties   Cognition Arousal/Alertness: Awake/alert Behavior During Therapy: WFL for tasks assessed/performed Overall Cognitive Status: History of cognitive impairments - at baseline                                 General Comments: has lewey body dementia, was appropriate conversationally. Oriented to situation, self, place, and some aspects of time. She is able to follow all commands, but attn and processing somewhat decreased.     General Comments       Exercises Other Exercises Other Exercises: OT ed re: role in acute setting, importance of OOB activity, safety considerations, AD/DME recs including BSC   Shoulder Instructions      Home Living Family/patient expects to be discharged to:: Private residence Living Arrangements: Other relatives Available Help at Discharge: Family;Available PRN/intermittently;Friend(s) Type of Home: House Home Access: Ramped entrance     Home Layout: One level     Bathroom Shower/Tub: Tub/shower unit         Home Equipment: Conservation officer, nature (2 wheels);Rollator (4 wheels);Cane - single point          Prior Functioning/Environment Prior Level of Function : History of Falls (last six months)             Mobility Comments: Patient has been history of falling, recent admission to  hospital. ADLs Comments: Prior to admission previously was able to ambulate household distances at baseline.        OT Problem List: Decreased strength;Decreased activity tolerance;Impaired balance (sitting and/or standing);Decreased safety awareness      OT Treatment/Interventions: Self-care/ADL training;Therapeutic exercise;Therapeutic activities;Energy conservation;Cognitive remediation/compensation;DME and/or AE instruction;Patient/family education;Balance training;Manual therapy    OT Goals(Current goals can be found in the care plan section) Acute Rehab OT Goals Patient Stated Goal: to go home OT Goal Formulation: With patient Time For Goal Achievement: 05/28/21 Potential to Achieve Goals: Good  OT Frequency: Min 2X/week   Barriers to D/C:            Co-evaluation              AM-PAC OT "6 Clicks" Daily Activity     Outcome Measure Help from another person eating meals?: None Help from another person taking care of personal grooming?: None Help from another person toileting, which includes using toliet, bedpan, or urinal?: A Little Help from another person bathing (including washing, rinsing, drying)?: A  Little Help from another person to put on and taking off regular upper body clothing?: None Help from another person to put on and taking off regular lower body clothing?: A Little 6 Click Score: 21   End of Session Equipment Utilized During Treatment: Gait belt;Rolling walker (2 wheels) Nurse Communication: Mobility status  Activity Tolerance: Patient tolerated treatment well Patient left: in bed;with call bell/phone within reach;with bed alarm set  OT Visit Diagnosis: Unsteadiness on feet (R26.81);Repeated falls (R29.6);Muscle weakness (generalized) (M62.81)                Time: 6837-2902 OT Time Calculation (min): 53 min Charges:  OT General Charges $OT Visit: 1 Visit OT Evaluation $OT Eval Moderate Complexity: 1 Mod OT Treatments $Self Care/Home  Management : 23-37 mins $Therapeutic Activity: 8-22 mins  Gerrianne Scale, MS, OTR/L ascom [phone] 05/25/21, 4:02 PM

## 2021-05-25 NOTE — Plan of Care (Signed)

## 2021-05-25 NOTE — Plan of Care (Signed)
PT is non compliant - refusing some meds, dressing changes etc.  MD is aware of pt refusals.   Problem: Education: Goal: Knowledge of General Education information will improve Description: Including pain rating scale, medication(s)/side effects and non-pharmacologic comfort measures 05/25/2021 1808 by Trula Slade, RN Outcome: Not Progressing 05/25/2021 0759 by Trula Slade, RN Outcome: Progressing   Problem: Health Behavior/Discharge Planning: Goal: Ability to manage health-related needs will improve 05/25/2021 1808 by Trula Slade, RN Outcome: Not Progressing 05/25/2021 0759 by Trula Slade, RN Outcome: Progressing   Problem: Clinical Measurements: Goal: Ability to maintain clinical measurements within normal limits will improve 05/25/2021 1808 by Trula Slade, RN Outcome: Not Progressing 05/25/2021 0759 by Trula Slade, RN Outcome: Progressing Goal: Will remain free from infection 05/25/2021 1808 by Trula Slade, RN Outcome: Not Progressing 05/25/2021 0759 by Trula Slade, RN Outcome: Progressing Goal: Diagnostic test results will improve 05/25/2021 1808 by Trula Slade, RN Outcome: Not Progressing 05/25/2021 0759 by Trula Slade, RN Outcome: Progressing Goal: Respiratory complications will improve 05/25/2021 1808 by Trula Slade, RN Outcome: Not Progressing 05/25/2021 0759 by Trula Slade, RN Outcome: Progressing Goal: Cardiovascular complication will be avoided 05/25/2021 1808 by Trula Slade, RN Outcome: Not Progressing 05/25/2021 0759 by Trula Slade, RN Outcome: Progressing   Problem: Activity: Goal: Risk for activity intolerance will decrease 05/25/2021 1808 by Trula Slade, RN Outcome: Not Progressing 05/25/2021 0759 by Trula Slade, RN Outcome: Progressing   Problem: Nutrition: Goal: Adequate nutrition will be maintained 05/25/2021 1808 by Trula Slade, RN Outcome: Not  Progressing 05/25/2021 0759 by Trula Slade, RN Outcome: Progressing   Problem: Pain Managment: Goal: General experience of comfort will improve 05/25/2021 1808 by Trula Slade, RN Outcome: Not Progressing 05/25/2021 0759 by Trula Slade, RN Outcome: Progressing   Problem: Skin Integrity: Goal: Risk for impaired skin integrity will decrease 05/25/2021 1808 by Trula Slade, RN Outcome: Not Progressing 05/25/2021 0759 by Trula Slade, RN Outcome: Progressing

## 2021-05-25 NOTE — Progress Notes (Signed)
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  May be due to advancing Lewy body dementia. PT/OT consulted   Chest pain: EKG without ischemic changes, serial troponins were negative.  Hx is atypical in nature.    Cellulitis of right lower extremity: continue on doxycycline. Wound care consulted   COPD: w/o exacerbation. Continue on bronchodilators   Anxiety: severity unknown. Continue on xanax, hydroxyzine   Muscle spasms: continue on flexeril  Lewy body dementia: continue on donepezil  Chronic diastolic CHF: appears compensated. Continue Lasix. Monitor I/Os.  Peripheral neuropathy: continue on gabapentin   GERD: continue on PPI   Chronic pain: continue on percocet prn   Adrenal insufficiency: continue on home dose of prednisone   DVT prophylaxis: lovenox  Code Status: DNR Family Communication:  Disposition Plan: possibly d/c to SNF   Level of care: Med-Surg  Status is: Inpatient  Remains inpatient appropriate because: severity of illness     Consultants:    Procedures:   Antimicrobials: doxycycline    Subjective: Pt c/o generalized weakness  Objective: Vitals:   05/24/21 1800 05/24/21 1900 05/24/21 2034 05/25/21 0432  BP: 127/82 129/71 (!) 156/78 138/71  Pulse: 100 95 98 90  Resp: 18 16 18 20   Temp:   98.7 F (37.1 C) 98 F (36.7 C)  TempSrc:   Oral   SpO2: 93% 93% 99% 95%   Weight:   73.7 kg   Height:   5\' 3"  (1.6 m)     Intake/Output Summary (Last 24 hours) at 05/25/2021 0746 Last data filed at 05/25/2021 0401 Gross per 24 hour  Intake 1250.02 ml  Output --  Net 1250.02 ml   Filed Weights   05/24/21 8413 05/24/21 2034  Weight: 68 kg 73.7 kg    Examination:  General exam: Appears calm and comfortable  Respiratory system: Clear to auscultation. Respiratory effort normal. Cardiovascular system: S1 & S2+. No rubs, gallops or clicks.  Gastrointestinal system: Abdomen is nondistended, soft and nontender.Normal bowel sounds heard. Central nervous system: Alert and oriented. Moves all extremities Psychiatry: Judgement and insight appear normal. Tangential thinking     Data Reviewed: I have personally reviewed following labs and imaging studies  CBC: Recent Labs  Lab 05/24/21 0635 05/25/21 0402  WBC 10.6* 8.8  HGB 13.4 11.1*  HCT 42.4 35.4*  MCV 95.3 95.9  PLT 350 244   Basic Metabolic Panel: Recent Labs  Lab 05/24/21 0635 05/24/21 0910 05/25/21 0402  NA 138  --  138  K 3.4*  --  4.1  CL 96*  --  104  CO2 31  --  27  GLUCOSE 140*  --  114*  BUN 16  --  12  CREATININE 0.87  --  0.88  CALCIUM 9.1  --  8.8*  MG  --  2.1  --    GFR: Estimated Creatinine Clearance: 66.2 mL/min (by C-G formula based on  SCr of 0.88 mg/dL). Liver Function Tests: Recent Labs  Lab 05/24/21 0910  AST 23  ALT 26  ALKPHOS 93  BILITOT 0.8  PROT 7.0  ALBUMIN 3.5   No results for input(s): LIPASE, AMYLASE in the last 168 hours. No results for input(s): AMMONIA in the last 168 hours. Coagulation Profile: No results for input(s): INR, PROTIME in the last 168 hours. Cardiac Enzymes: Recent Labs  Lab 05/24/21 0910  CKTOTAL 27*   BNP (last 3 results) No results for input(s): PROBNP in the last 8760 hours. HbA1C: No results for input(s): HGBA1C in the last 72 hours. CBG: No results for input(s): GLUCAP in the last 168 hours. Lipid Profile: No  results for input(s): CHOL, HDL, LDLCALC, TRIG, CHOLHDL, LDLDIRECT in the last 72 hours. Thyroid Function Tests: Recent Labs    05/25/21 0402  TSH 1.986   Anemia Panel: No results for input(s): VITAMINB12, FOLATE, FERRITIN, TIBC, IRON, RETICCTPCT in the last 72 hours. Sepsis Labs: Recent Labs  Lab 05/24/21 0910 05/24/21 1029 05/24/21 1336  PROCALCITON <0.10  --   --   LATICACIDVEN  --  2.3* 1.9    Recent Results (from the past 240 hour(s))  Resp Panel by RT-PCR (Flu A&B, Covid) Nasopharyngeal Swab     Status: None   Collection Time: 05/24/21 10:29 AM   Specimen: Nasopharyngeal Swab; Nasopharyngeal(NP) swabs in vial transport medium  Result Value Ref Range Status   SARS Coronavirus 2 by RT PCR NEGATIVE NEGATIVE Final    Comment: (NOTE) SARS-CoV-2 target nucleic acids are NOT DETECTED.  The SARS-CoV-2 RNA is generally detectable in upper respiratory specimens during the acute phase of infection. The lowest concentration of SARS-CoV-2 viral copies this assay can detect is 138 copies/mL. A negative result does not preclude SARS-Cov-2 infection and should not be used as the sole basis for treatment or other patient management decisions. A negative result may occur with  improper specimen collection/handling, submission of specimen other than nasopharyngeal swab, presence of viral mutation(s) within the areas targeted by this assay, and inadequate number of viral copies(<138 copies/mL). A negative result must be combined with clinical observations, patient history, and epidemiological information. The expected result is Negative.  Fact Sheet for Patients:  EntrepreneurPulse.com.au  Fact Sheet for Healthcare Providers:  IncredibleEmployment.be  This test is no t yet approved or cleared by the [place] FDA and  has been authorized for detection and/or diagnosis of SARS-CoV-2 by FDA under an Emergency Use Authorization (EUA). This EUA  will remain  in effect (meaning this test can be used) for the duration of the COVID-19 declaration under Section 564(b)(1) of the Act, 21 U.S.C.section 360bbb-3(b)(1), unless the authorization is terminated  or revoked sooner.       Influenza A by PCR NEGATIVE NEGATIVE Final   Influenza B by PCR NEGATIVE NEGATIVE Final    Comment: (NOTE) The Xpert Xpress SARS-CoV-2/FLU/RSV plus assay is intended as an aid in the diagnosis of influenza from Nasopharyngeal swab specimens and should not be used as a sole basis for treatment. Nasal washings and aspirates are unacceptable for Xpert Xpress SARS-CoV-2/FLU/RSV testing.  Fact Sheet for Patients: EntrepreneurPulse.com.au  Fact Sheet for Healthcare Providers: IncredibleEmployment.be  This test is not yet approved or cleared by the [place] FDA and has been authorized for detection and/or diagnosis of SARS-CoV-2 by FDA under an Emergency Use Authorization (EUA). This EUA will remain in effect (meaning this test can be used) for the duration of the COVID-19 declaration under Section 564(b)(1)  of the Act, 21 U.S.C. section 360bbb-3(b)(1), unless the authorization is terminated or revoked.  Performed at [facility], [address] [place], [place] [address]          Radiology Studies: DG Chest 2 View  Result Date: 05/24/2021 CLINICAL DATA:  Central chest pain and pleuritic chest pain. Recent fall with right rib fractures by CT. EXAM: CHEST - 2 VIEW COMPARISON:  CT of the chest on 05/12/2021 FINDINGS: The heart size and mediastinal contours are within normal limits. There is no evidence of pulmonary edema, consolidation, pneumothorax, nodule or pleural fluid. The acute minimally displaced fractures of the lateral right seventh and eighth ribs seen by CT are difficult to appreciate by x-ray but show no evidence of significant displacement. Several old left-sided rib fractures are noted at  the level of the fifth, sixth and eighth ribs. IMPRESSION: Recent lateral right seventh and eighth rib fractures are difficult to see by chest x-ray but show no significant displacement. No associated pneumothorax or pleural fluid. Old left-sided rib fractures again noted. Electronically Signed   By: Aletta Edouard M.D.   On: 05/24/2021 08:38   DG Tibia/Fibula Right  Result Date: 05/24/2021 CLINICAL DATA:  Fall EXAM: RIGHT TIBIA AND FIBULA - 2 VIEW COMPARISON:  None. FINDINGS: There is no evidence of fracture or other focal bone lesions. Soft tissues are unremarkable. IMPRESSION: Negative. Electronically Signed   By: Franchot Gallo M.D.   On: 05/24/2021 10:27   CT HEAD WO CONTRAST (5MM)  Result Date: 05/24/2021 CLINICAL DATA:  Fall EXAM: CT HEAD WITHOUT CONTRAST CT CERVICAL SPINE WITHOUT CONTRAST TECHNIQUE: Multidetector CT imaging of the head and cervical spine was performed following the standard protocol without intravenous contrast. Multiplanar CT image reconstructions of the cervical spine were also generated. COMPARISON:  CT head 05/12/2021 FINDINGS: CT HEAD FINDINGS Brain: No evidence of acute infarction, hemorrhage, hydrocephalus, extra-axial collection or mass lesion/mass effect. Mild atrophy Vascular: Negative for hyperdense vessel Skull: Negative for fracture Sinuses/Orbits: Prior sinus surgery. Paranasal sinuses clear. Negative orbit Other: None CT CERVICAL SPINE FINDINGS Alignment: 3 mm anterolisthesis C5-6. 6 mm anterolisthesis C6-7. 3 mm anterolisthesis C7-T1. Alignment is similar to the prior CT 11/07/2020 Skull base and vertebrae: Negative for fracture Soft tissues and spinal canal: Negative for soft tissue mass or swelling. Disc levels: Disc degeneration facet degeneration throughout the cervical spine. ACDF with solid fusion at C4-5. Upper chest: No acute abnormality Other: None IMPRESSION: 1. No acute intracranial abnormality 2. Negative for cervical spine fracture 3. Advanced  spondylosis and spondylolisthesis. Electronically Signed   By: Franchot Gallo M.D.   On: 05/24/2021 10:39   CT Angio Chest PE W and/or Wo Contrast  Result Date: 05/24/2021 CLINICAL DATA:  60 year old female with chest pain, recent fall. Decreased lung sounds on the right. EXAM: CT ANGIOGRAPHY CHEST WITH CONTRAST TECHNIQUE: Multidetector CT imaging of the chest was performed using the standard protocol during bolus administration of intravenous contrast. Multiplanar CT image reconstructions and MIPs were obtained to evaluate the vascular anatomy. CONTRAST:  180mL OMNIPAQUE IOHEXOL 350 MG/ML SOLN COMPARISON:  CT Chest, Abdomen, and Pelvis 05/12/2021. Thoracic and lumbar spine CT today reported separately. FINDINGS: Cardiovascular: Good contrast bolus timing in the pulmonary arterial tree. No focal filling defect identified in the pulmonary arteries to suggest acute pulmonary embolism. No cardiomegaly or pericardial effusion. Negative thoracic aorta aside from mild calcified atherosclerosis. Mediastinum/Nodes: Negative. No mediastinal hematoma or lymphadenopathy. Lungs/Pleura: No pleural effusion. No pneumothorax. Lower lung volumes compared to last month.  Major airways remain patent. Mild dependent atelectasis. No pulmonary contusion or other confluent opacity. Upper Abdomen: Negative visible liver, gallbladder, spleen, pancreas, adrenal glands and bowel in the upper abdomen. Mild nephrolithiasis. Musculoskeletal: Thoracic spine detailed separately. There is advanced lower cervical spondylolisthesis superimposed on a previous C4-C5 (or C3 through C5) ACDF. Osteopenia. Mildly displaced right lateral 7th and 8th rib fractures again noted. There are superimposed chronic right lateral 5, 6, 9 and 10 rib fractures. No new right rib fracture compared to last month. And right lateral chest wall soft tissue swelling situated between the lateral 7th and 8th ribs has decreased compatible with resolving soft tissue hematoma  (series 4, image 67 today). No acute left rib fracture. Chronic left 4th and 5th rib fractures. Intact sternum. Review of the MIP images confirms the above findings. IMPRESSION: 1. No evidence of acute pulmonary embolus. 2. Lower lung volumes compared to last month with mild atelectasis. Mildly displaced right lateral 7th and 8th rib fractures again noted. Underlying chronic bilateral rib fractures. 3. No new traumatic injury identified in the chest. Thoracic Spine CT reported separately. Advanced lower cervical spondylolisthesis superimposed on previous ACDF. Electronically Signed   By: Genevie Ann M.D.   On: 05/24/2021 10:40   CT Cervical Spine Wo Contrast  Result Date: 05/24/2021 CLINICAL DATA:  Fall EXAM: CT HEAD WITHOUT CONTRAST CT CERVICAL SPINE WITHOUT CONTRAST TECHNIQUE: Multidetector CT imaging of the head and cervical spine was performed following the standard protocol without intravenous contrast. Multiplanar CT image reconstructions of the cervical spine were also generated. COMPARISON:  CT head 05/12/2021 FINDINGS: CT HEAD FINDINGS Brain: No evidence of acute infarction, hemorrhage, hydrocephalus, extra-axial collection or mass lesion/mass effect. Mild atrophy Vascular: Negative for hyperdense vessel Skull: Negative for fracture Sinuses/Orbits: Prior sinus surgery. Paranasal sinuses clear. Negative orbit Other: None CT CERVICAL SPINE FINDINGS Alignment: 3 mm anterolisthesis C5-6. 6 mm anterolisthesis C6-7. 3 mm anterolisthesis C7-T1. Alignment is similar to the prior CT 11/07/2020 Skull base and vertebrae: Negative for fracture Soft tissues and spinal canal: Negative for soft tissue mass or swelling. Disc levels: Disc degeneration facet degeneration throughout the cervical spine. ACDF with solid fusion at C4-5. Upper chest: No acute abnormality Other: None IMPRESSION: 1. No acute intracranial abnormality 2. Negative for cervical spine fracture 3. Advanced spondylosis and spondylolisthesis.  Electronically Signed   By: Franchot Gallo M.D.   On: 05/24/2021 10:39   CT ABDOMEN PELVIS W CONTRAST  Result Date: 05/24/2021 CLINICAL DATA:  60 year old female with chest pain, recent fall. Decreased lung sounds on the right. EXAM: CT ABDOMEN AND PELVIS WITH CONTRAST TECHNIQUE: Multidetector CT imaging of the abdomen and pelvis was performed using the standard protocol following bolus administration of intravenous contrast. CONTRAST:  126mL OMNIPAQUE IOHEXOL 350 MG/ML SOLN COMPARISON:  CTA chest today. CT Chest, Abdomen, and Pelvis 05/12/2021. Lumbar spine CT today reported separately. FINDINGS: Lower chest: Reported separately today. Hepatobiliary: Stable since 2019, negative. Pancreas: Negative. Spleen: Negative. Adrenals/Urinary Tract: Normal adrenal glands. Punctate nephrolithiasis on the left. Nonobstructed kidneys with symmetric renal enhancement and contrast excretion. Negative ureters and bladder. Stomach/Bowel: Negative rectum. Coarsely calcified perirectal lymph node is small, stable, and likely postinflammatory. Sigmoid and descending diverticulosis without active inflammation. Negative transverse and right colon. Prior appendectomy. Negative terminal ileum. No dilated small bowel. Decompressed stomach and duodenum. No free air or free fluid. Vascular/Lymphatic: Aortoiliac calcified atherosclerosis. Major arterial structures in the abdomen and pelvis remain patent. Portal venous system is patent. No lymphadenopathy. Reproductive: Absent uterus.  Diminutive or  absent ovaries. Other: No pelvic free fluid. Musculoskeletal: Lumbar spine reported separately today. Chronic left hip arthroplasty. Chronic right pubic rami fractures. No acute osseous abnormality identified. IMPRESSION: 1. No acute or inflammatory process identified in the abdomen or pelvis. Lumbar spine CT reported separately. 2. Distal large bowel diverticulosis. Punctate nephrolithiasis. Aortic Atherosclerosis (ICD10-I70.0). Electronically  Signed   By: Genevie Ann M.D.   On: 05/24/2021 10:44   CT T-SPINE NO CHARGE  Result Date: 05/24/2021 CLINICAL DATA:  60 year old female with pain, recent fall. EXAM: CT THORACIC SPINE WITH CONTRAST TECHNIQUE: Multiplanar CT images of the thoracic spine were reconstructed from contemporary CTA of the Chest. CONTRAST:  No additional COMPARISON:  CT cervical spine and CTA chest today reported separately. Thoracic spine MRI 01/19/2021. FINDINGS: Limited cervical spine imaging: Multilevel lower cervical spondylolisthesis, including 6-7 mm anterolisthesis of C6 on C7, stable since July. Thoracic spine segmentation:  Normal. Alignment: Stable thoracic kyphosis since July. No thoracic spondylolisthesis. Vertebrae: Osteopenia. Thoracic vertebrae appear stable and intact. Rib details are reported separately today. Paraspinal and other soft tissues: Thoracic paraspinal soft tissues are within normal limits. Chest and abdominal viscera are reported separately today. Disc levels: Capacious thoracic spinal canal as seen in July. No CT evidence of spinal stenosis. Chronic thoracic facet hypertrophy, stable since July. IMPRESSION: 1. No acute traumatic injury identified in the thoracic spine. Osteopenia. 2. Severe lower cervical spine spondylolisthesis. See CT cervical spine today reported Separately. 3. Mild for age thoracic spine degeneration is stable since a July MRI. 4. CT Chest, Abdomen, and Pelvis today reported separately. Electronically Signed   By: Genevie Ann M.D.   On: 05/24/2021 10:50   CT L-SPINE NO CHARGE  Result Date: 05/24/2021 CLINICAL DATA:  60 year old female with chest pain, recent fall. Decreased lung sounds on the right. EXAM: CT LUMBAR SPINE WITH CONTRAST TECHNIQUE: Technique: Multiplanar CT images of the lumbar spine were reconstructed from contemporary CT of the Abdomen and Pelvis. CONTRAST:  No additional COMPARISON:  CT Chest, Abdomen, and Pelvis today are reported separately. Lumbar MRI 01/19/2021. CT  Abdomen and Pelvis 09/12/2019 FINDINGS: Segmentation: Normal, concordant with the thoracic spine numbering today. Alignment: Lumbar lordosis is stable since July. Anterolisthesis of L3 on L4 is less apparent now. Vertebrae: Osteopenia. Chronic right L3 transverse process fracture (series 1, image 53) stable since last year. No acute osseous abnormality identified. In the lumbar spine. Visible sacrum and SI joints appear intact. Paraspinal and other soft tissues: Abdominal and pelvic viscera are reported separately. Lumbar paraspinal soft tissues remain normal. Disc levels: Capacious spinal canal at most levels as seen in July. Stable disc bulging at L3-L4 and L4-L5 with up to mild spinal stenosis at the former. IMPRESSION: 1. Osteopenia. No acute traumatic injury identified in the lumbar spine. Chronic right L3 transverse process fracture. 2. Stable mild lumbar spine degeneration since an MRI in July, disc bulging at L3-L4 with up to mild spinal stenosis. 3. CT Abdomen and Pelvis today reported separately. Electronically Signed   By: Genevie Ann M.D.   On: 05/24/2021 10:54   DG Knee Complete 4 Views Left  Result Date: 05/24/2021 CLINICAL DATA:  60 year old female with pain, recent fall. EXAM: LEFT KNEE - COMPLETE 4+ VIEW COMPARISON:  None. FINDINGS: Bone mineralization is within normal limits. No evidence of fracture, dislocation, or joint effusion. No evidence of arthropathy or other focal bone abnormality. No discrete soft tissue injury. IMPRESSION: Negative. Electronically Signed   By: Genevie Ann M.D.   On: 05/24/2021 10:46  DG Knee Complete 4 Views Right  Result Date: 05/24/2021 CLINICAL DATA:  60 year old female with pain, recent fall. EXAM: RIGHT KNEE - COMPLETE 4+ VIEW COMPARISON:  Right tib fib series 03/20/2015. FINDINGS: Bone mineralization is within normal limits. No evidence of fracture, dislocation, or joint effusion. No evidence of arthropathy or other focal bone abnormality. No discrete soft tissue  injury. IMPRESSION: Negative. Electronically Signed   By: Genevie Ann M.D.   On: 05/24/2021 10:46        Scheduled Meds:  donepezil  10 mg Oral QHS   enoxaparin (LOVENOX) injection  40 mg Subcutaneous Q24H   fluticasone furoate-vilanterol  1 puff Inhalation Daily   furosemide  80 mg Oral Daily   gabapentin  600 mg Oral TID   magnesium oxide  400 mg Oral Daily   pantoprazole  80 mg Oral Daily   potassium chloride SA  20 mEq Oral BID   predniSONE  15 mg Oral Q breakfast   sodium chloride flush  3 mL Intravenous Q12H   traZODone  100 mg Oral QHS   Continuous Infusions:  doxycycline (VIBRAMYCIN) IV Stopped (05/24/21 2335)     LOS: 1 day    Time spent: 33 mins    Wyvonnia Dusky, MD [facility] Pager [phone]  If 7PM-7AM, please contact night-coverage 05/25/2021, 7:46 AM

## 2021-05-25 NOTE — Progress Notes (Signed)
PHARMACIST - PHYSICIAN COMMUNICATION  CONCERNING: Antibiotic IV to Oral Route Change Policy  RECOMMENDATION: This patient is receiving doxycycline by the intravenous route.  Based on criteria approved by the Pharmacy and Therapeutics Committee, the antibiotic(s) is/are being converted to the equivalent oral dose form(s).   DESCRIPTION: These criteria include: Patient being treated for a respiratory tract infection, urinary tract infection, cellulitis or clostridium difficile associated diarrhea if on metronidazole The patient is not neutropenic and does not exhibit a GI malabsorption state The patient is eating (either orally or via tube) and/or has been taking other orally administered medications for a least 24 hours The patient is improving clinically and has a Tmax < 100.5  If you have questions about this conversion, please contact the [place]  05/25/21

## 2021-05-25 NOTE — Consult Note (Signed)
WOC Nurse Consult Note: Reason for Consult:Right LE skin tear (avulsion, full thickness). International Skin Tear Advisory Panel Classification (ISTAP): Type 2, partial skin flap loss.See photo documenting skin injury in EHR. Wound type:trauma Pressure Injury POA: N/A Measurement:To be obtained by Bedside RN today and documented on Nursing Flow Sheet. Wound bed:Dry, red. Skin flap tp proximal half of wound is ecchymotic, but may be viable. Drainage (amount, consistency, odor) none Periwound:with evidence of previous wound healing, erythema, edema Dressing procedure/placement/frequency: I will implement a POC using a daily cleanse with NS, followed by placement of a folded antimicrobial nonadherent (xeroform) gauze. This will be topped with dry dressings and secured with a Kerlix roll gauze wrap/paper tape. Other pressure injury interventions are initiated such as  a silicone foam bordered dressing to the sacrum and the floatation of heels.  [facility] nursing team will not follow, but will remain available to this patient, the nursing and medical teams.  Please re-consult if needed. Thanks, Maudie Flakes, MSN, RN, [facility], [facility]  Pager# [phone]

## 2021-05-25 NOTE — Progress Notes (Deleted)
RLE PURPLE BRUISED ARE MEASURING 4CM X 5CM - NO DRAINAGE WARMTH OR REDNESS TO SURROUNDING SKIN NOTED.  PT REFUSED DRESSING CHANGE/APPLICATION

## 2021-05-25 NOTE — Evaluation (Signed)
Physical Therapy Evaluation Patient Details Name: [name] MRN: [id number] DOB: [birth year] Today's Date: 05/25/2021  History of Present Illness  Patient is a 60 year old female who presents to hospital after feeling like her legs are giving out, increased falls, and chest pain. Patient recently admitted from October 25th to October 28 with a fall and chest wall laceration. PMH includes COPD, CHF,Type II DM, Lewy Body dementia, cervical radiculopathy, adrenal insufficiency, rib fracture from recent fall. Patient was found to have new cellulitis of RLE.   Clinical Impression  Patient presents as a high fall risk. Patient was recently admitted to hospital for a fall with chest wall laceration. Since discharge has had an increase in falls. Patient limited testing due to intermittent agitation and tangential verboseness. Her history is additionally limited by intermittent agitation. Patient lives at home alone with a ramp to enter. Has a dog at home. Patient is sitting EOB upon PT entering room, gets up to grab a drink of water without any episode of instability, tremors, or indication of deficit. Upon returning to sit and answering PT questions she began intermittent tremors. Upon testing she has increased tremors and instability and upon assessing ambulation she begins to sway and stumble. Upon standing she has non-regulated high velocity eye movements that are not saccades. Patient returned to bed and physician entered room ending session.  Pt would benefit from skilled PT to address noted impairments and functional limitations (see below for any additional details).  Upon hospital discharge, pt would benefit from SNF due to high fall risk and high risk for re-admission.        Recommendations for follow up therapy are one component of a multi-disciplinary discharge planning process, led by the attending physician.  Recommendations may be updated based on patient status, additional functional criteria  and insurance authorization.  Follow Up Recommendations Skilled nursing-short term rehab (<3 hours/day)    Assistance Recommended at Discharge Frequent or constant Supervision/Assistance  Functional Status Assessment Patient has had a recent decline in their functional status and/or demonstrates limited ability to make significant improvements in function in a reasonable and predictable amount of time  Equipment Recommendations  Rolling walker (2 wheels);BSC    Recommendations for Other Services       Precautions / Restrictions Precautions Precautions: Fall Precaution Comments: rib fractures, hx of needing a cervical collar. Restrictions Weight Bearing Restrictions: No      Mobility  Bed Mobility Overal bed mobility: Modified Independent             General bed mobility comments: no physical assistance provided    Transfers Overall transfer level: Needs assistance Equipment used: Rolling walker (2 wheels) Transfers: Sit to/from Stand Sit to Stand: Supervision;Min guard           General transfer comment: able to perform when distracted without instability, upon focusing on task had multiple bouts of instability    Ambulation/Gait Ambulation/Gait assistance: Min guard Gait Distance (Feet): 40 Feet Assistive device: Rolling walker (2 wheels)       General Gait Details: when ambulating without PT direction no instability, upon directing for focused ambulation has frequent instability and shaking.  Stairs            Wheelchair Mobility    Modified Rankin (Stroke Patients Only)       Balance Overall balance assessment: Needs assistance;History of Falls Sitting-balance support: Feet supported Sitting balance-Leahy Scale: Good     Standing balance support: Bilateral upper extremity supported;During functional activity  Standing balance-Leahy Scale: Fair Standing balance comment: requires close CGA due to intermittent intention tremors.                              Pertinent Vitals/Pain Pain Assessment: 0-10 Pain Score: 8  Pain Location: total body Pain Descriptors / Indicators: Aching Pain Intervention(s): Limited activity within patient's tolerance;Monitored during session    [place] expects to be discharged to:: Private residence   Available Help at Discharge: Family;Available PRN/intermittently;Friend(s) Type of Home: House Home Access: Ramped entrance       Home Layout: One level Home Equipment: Conservation officer, nature (2 wheels);Rollator (4 wheels);Cane - single point      Prior Function Prior Level of Function : History of Falls (last six months)             Mobility Comments: Patient has been history of falling, recent admission to hospital. ADLs Comments: Prior to admission previously was able to ambulate household distances at baseline.     Hand Dominance   Dominant Hand: Right    Extremity/Trunk Assessment   Upper Extremity Assessment Upper Extremity Assessment: Defer to OT evaluation    Lower Extremity Assessment Lower Extremity Assessment: Generalized weakness (Patient limited testing due to intermittent agitation and tangential verboseness.)    Cervical / Trunk Assessment Cervical / Trunk Assessment: Normal  Communication   Communication: No difficulties  Cognition Arousal/Alertness: Awake/alert Behavior During Therapy: WFL for tasks assessed/performed Overall Cognitive Status: History of cognitive impairments - at baseline                                 General Comments: has lewey body dementia        General Comments General comments (skin integrity, edema, etc.): multiple open wounds    Exercises Other Exercises Other Exercises: Patient educated on role of PT in acute care setting, safe mobility and transfers.   Assessment/Plan    PT Assessment Patient needs continued PT services  PT Problem List Decreased strength;Decreased range of  motion;Decreased activity tolerance;Decreased balance;Decreased mobility;Decreased coordination;Decreased cognition;Decreased knowledge of use of DME;Decreased safety awareness;Decreased knowledge of precautions;Pain;Impaired sensation;Decreased skin integrity       PT Treatment Interventions DME instruction;Gait training;Functional mobility training;Therapeutic activities;Therapeutic exercise;Stair training;Balance training;Cognitive remediation;Patient/family education;Neuromuscular re-education    PT Goals (Current goals can be found in the Care Plan section)  Acute Rehab PT Goals Patient Stated Goal: to not fall as much PT Goal Formulation: With patient Time For Goal Achievement: 06/08/21 Potential to Achieve Goals: Fair    Frequency Min 2X/week   Barriers to discharge Decreased caregiver support Patient is not safe to be home alone    Co-evaluation               AM-PAC PT "6 Clicks" Mobility  Outcome Measure Help needed turning from your back to your side while in a flat bed without using bedrails?: A Little Help needed moving from lying on your back to sitting on the side of a flat bed without using bedrails?: A Little Help needed moving to and from a bed to a chair (including a wheelchair)?: A Little Help needed standing up from a chair using your arms (e.g., wheelchair or bedside chair)?: A Little Help needed to walk in hospital room?: A Little Help needed climbing 3-5 steps with a railing? : A Lot 6 Click Score: 17    End  of Session Equipment Utilized During Treatment: Gait belt Activity Tolerance: Treatment limited secondary to agitation Patient left: in bed;with call bell/phone within reach;Other (comment) (physician in room) Nurse Communication: Mobility status PT Visit Diagnosis: Unsteadiness on feet (R26.81);Difficulty in walking, not elsewhere classified (R26.2);Muscle weakness (generalized) (M62.81);History of falling (Z91.81);Dizziness and giddiness  (R42);Other abnormalities of gait and mobility (R26.89);Repeated falls (R29.6);Pain Pain - Right/Left: Left (bilateral) Pain - part of body:  (whole body)    Time: 2595-6387 PT Time Calculation (min) (ACUTE ONLY): 24 min   Charges:   PT Evaluation $PT Eval High Complexity: 1 High PT Treatments $Therapeutic Activity: 8-22 mins        Janna Arch, PT, DPT  05/25/2021, 12:13 PM

## 2021-05-26 LAB — BASIC METABOLIC PANEL
Anion gap: 8 (ref 5–15)
BUN: 13 mg/dL (ref 6–20)
CO2: 29 mmol/L (ref 22–32)
Calcium: 9.4 mg/dL (ref 8.9–10.3)
Chloride: 104 mmol/L (ref 98–111)
Creatinine, Ser: 0.88 mg/dL (ref 0.44–1.00)
GFR, Estimated: >60 mL/min (ref >60)
Glucose, Bld: 115 mg/dL — ABNORMAL HIGH (ref 70–99)
Potassium: 4.4 mmol/L (ref 3.5–5.1)
Sodium: 141 mmol/L (ref 135–145)

## 2021-05-26 LAB — CBC
HCT: 40.8 % (ref 36.0–46.0)
Hemoglobin: 12.9 g/dL (ref 12.0–15.0)
MCH: 30.1 pg (ref 26.0–34.0)
MCHC: 31.6 g/dL (ref 30.0–36.0)
MCV: 95.1 fL (ref 80.0–100.0)
Platelets: 297 10*3/uL (ref 150–400)
RBC: 4.29 MIL/uL (ref 3.87–5.11)
RDW: 14.4 % (ref 11.5–15.5)
WBC: 9.4 10*3/uL (ref 4.0–10.5)
nRBC: 0 % (ref 0.0–0.2)

## 2021-05-26 MED ORDER — DOXYCYCLINE HYCLATE 100 MG PO TABS
100.0000 mg | ORAL_TABLET | Freq: Two times a day (BID) | ORAL | 0 refills | Status: AC
Start: 2021-05-26 — End: 2021-05-31

## 2021-05-26 NOTE — Discharge Summary (Signed)
Physician Discharge Summary  [name] POL:[id number] DOB: [birth year] DOA: 05/24/2021  PCP: Townsend Roger, MD  Admit date: 05/24/2021 Discharge date: 05/26/2021  Admitted From: home  Disposition:  home w/ home health   Recommendations for Outpatient Follow-up:  Follow up with PCP in 1 week   Home Health: yes Equipment/Devices:  Discharge Condition: stable  CODE STATUS: DNR  Diet recommendation: regular   Brief/Interim Summary: HPI was taken from Dr. Dione Plover:   [name] is a 60 y.o. female with history of COPD, chronic diastolic CHF, type 2 diabetes with recently normal A1c, Lewy body dementia in early stages, cervical radiculopathy, adrenal insufficiency, and recent admission for fall with rib fracture who presents with chest pain and ongoing falls.   Patient was recently admitted from October 25 to October 28.  Presented at that time with a fall and chest wall laceration.  She was seen by PT and discharged home with home PT.   Patient reports that for about the past day or so she has been experiencing increased falls like her legs are giving out as well as chest pain.  Feels like she will be walking fine and all of a sudden her legs will give out.  Denies any loss of consciousness, dizziness, or other preceding symptoms, but has been feeling diffuse muscle pain throughout her body.  As for the chest pain it is constant, centrally located without radiation, worse with deep inspiration and with palpation.  She reports it is a burning-like sensation.   Patient also concerned about skin tear on her right shin.  She reports the area around it has become increasingly more red and she is worried it is now infected.   In the ED initial vital signs notable for mild intermittent hypertension and mild tachycardia to the low 100s, other vital signs normal.  Subsequently underwent extensive laboratory and imaging work-up that was largely unremarkable.  CMP and CBC without any significant  findings, serial troponins were negative, CK was normal, procalcitonin was negative.  COVID and influenza swab was negative.  Lactic acid was initially mildly elevated 2.3 and down trended to 1.9 after fluids.  UA was notable for specific gravity of greater than 1.046, but no signs of infection with negative nitrite and leukocytes.  She had a chest x-ray that showed no change in findings of her rib fracture.  Plain films of the right and left knees as well as the right tibia/fibula were negative for any acute findings.  CT head and cervical spine without contrast showed no acute findings.  CTPA as well as CT abdomen pelvis with contrast also showed no acute findings.   Hospital course from Dr. Jimmye Norman 11/6-11/7/22: Pt presented after a fall at home. This was the pt's second time in the past 2 weeks for frequent falls. PT recs SNF and OT recs HH. Pt refused SNF but agreed to [facility]. Of note, pt was found to have RLE cellulitis that initially treated w/ IV doxycycline. Pt was d/c home w/ po doxycycline to complete the course. Furthermore, pt also c/o chest pain that was unlikely cardiac in etiology. EKG did not show ischemic changes and troponins were neg. For more information, please see previous progress notes.   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  May be due to advancing Lewy body dementia. PT recs SNF and OT recs HH   Chest pain: EKG without ischemic changes, serial troponins were negative.  Hx is atypical in nature.     Cellulitis of right lower extremity: continue on doxycycline. Continue w/ wound care   COPD: w/o exacerbation. Continue on bronchodilators   Anxiety: severity unknown. Continue on xanax, hydroxyzine   Muscle spasms: continue on flexeril  Lewy body dementia: continue on donepezil  Chronic diastolic CHF: appears compensated. Continue Lasix. Monitor I/Os.  Peripheral neuropathy: continue on gabapentin   GERD: continue on PPI   Chronic pain: continue on percocet prn   Adrenal insufficiency: continue on home dose of prednisone  Discharge Instructions  Discharge Instructions     Diet - low sodium heart healthy   Complete by: As directed    Discharge instructions   Complete by: As directed    F/u w/ PCP in 1 week   Discharge wound care:   Complete by: As directed    Wound care  Daily      Comments: Wound care to right LE skin tear (avulsion, full thickness):  Cleanse with NS, pat dry. Cover with folded piece of xeroform gauze, top with dry gauze and secure with a few turns of Kerlix roll gauze/paper tape. Change daily.   Increase activity slowly   Complete by: As directed       Allergies as of 05/26/2021       Reactions   Bupropion Other (See Comments), Nausea Only, Shortness Of Breath, Nausea And Vomiting   cold sweats, extended release, only can take [facility] brand Issues with extended release medications. Other reaction(s): Unknown ER- cold sweats, extended release, only can take [facility] brand Issues with extended release medications.   Levaquin [levofloxacin In D5w] Anaphylaxis   Methylprednisolone Acetate Other (See Comments)   Amoxicillin Itching   Codeine Nausea And Vomiting   Cymbalta [duloxetine Hcl] Swelling   Gatifloxacin Hives, Swelling   Other Reaction: Other reaction: Swelling   Guaifenesin & Derivatives Hives, Swelling   Keflex [cephalexin] Other (See Comments)   Unknown   Klonopin [clonazepam] Other (See Comments)   "Makes me have bad thoughts"    Macrobid [nitrofurantoin Monohyd Macro] Other (See Comments)   "kidneys shuts down" Chills, Fever.   Penicillins Hives   Has patient had a PCN reaction causing immediate rash, facial/tongue/throat swelling, SOB or lightheadedness with hypotension: Yes Has patient had a PCN reaction causing severe rash involving mucus membranes or skin necrosis: No Has patient had a PCN reaction that required hospitalization: No Has patient had a PCN reaction occurring within the last 10 years: No If all of the above answers are "NO", then may proceed with Cephalosporin use.   Sulfa Antibiotics Hives   Amitriptyline Rash   "It makes me want to fall."   Other Rash   cortisol        Medication List     TAKE these medications    Advair Diskus 250-50 MCG/DOSE Aepb Generic drug: Fluticasone-Salmeterol Inhale 1 puff into the lungs every 12 (twelve) hours.   albuterol 108 (90 Base) MCG/ACT inhaler Commonly known as: VENTOLIN HFA Inhale 2 puffs into the lungs every 6 (six) hours as needed.   ALPRAZolam 1  MG tablet Commonly known as: XANAX Take 1 tablet (1 mg total) by mouth 4 (four) times daily as needed for anxiety (home med).   benzonatate 200 MG capsule Commonly known as: TESSALON Take by mouth.   butalbital-acetaminophen-caffeine 50-325-40 MG tablet Commonly known as: FIORICET Take 2 tablets by mouth daily as needed for headache.   Calcium Carbonate-Vitamin D 600-400 MG-UNIT tablet Take 1 tablet by mouth daily.   cyclobenzaprine 10 MG tablet Commonly known as: FLEXERIL Take 10 mg by mouth 3 (three) times daily as needed.   donepezil 10 MG tablet Commonly known as: ARICEPT Take 10 mg by mouth at bedtime.   doxycycline 100 MG tablet Commonly known as: VIBRA-TABS Take 1 tablet (100 mg total) by mouth every 12 (twelve) hours for 5 days.   EPINEPHrine 0.3 mg/0.3 mL Soaj injection Commonly known as: EPI-PEN Inject 0.3 mg into the muscle once.   fluticasone 50 MCG/ACT nasal  spray Commonly known as: FLONASE Place 2 sprays into the nose daily.   furosemide 80 MG tablet Commonly known as: LASIX Take by mouth.   gabapentin 600 MG tablet Commonly known as: NEURONTIN Take 1 tablet by mouth 3 (three) times daily.   hydrOXYzine 25 MG tablet Commonly known as: ATARAX/VISTARIL Take 25 mg by mouth 3 (three) times daily as needed for itching.   magnesium oxide 400 MG tablet Commonly known as: MAG-OX Take 400 mg by mouth daily.   meclizine 25 MG tablet Commonly known as: ANTIVERT Take 1 tablet (25 mg total) by mouth 3 (three) times daily as needed for dizziness.   Narcan 4 MG/0.1ML Liqd nasal spray kit Generic drug: naloxone SMARTSIG:Both Nares   omeprazole 40 MG capsule Commonly known as: PRILOSEC Take 1 capsule by mouth 2 (two) times daily.   ondansetron 4 MG disintegrating tablet Commonly known as: ZOFRAN-ODT Take 1 tablet (4 mg total) by mouth every 8 (eight) hours as needed for nausea or vomiting.   oxyCODONE-acetaminophen 10-325 MG tablet Commonly known as: PERCOCET Take 1 tablet by mouth every 4 (four) hours as needed for pain (pt has to take [facility] brand).   potassium chloride SA 20 MEQ tablet Commonly known as: KLOR-CON Take 1 tablet (20 mEq total) by mouth 2 (two) times daily.   predniSONE 5 MG tablet Commonly known as: DELTASONE Take 15 mg by mouth daily with breakfast.   Premarin vaginal cream Generic drug: conjugated estrogens Place vaginally.   promethazine 25 MG tablet Commonly known as: PHENERGAN Take 1 tablet (25 mg total) by mouth every 8 (eight) hours as needed for nausea or vomiting.   traZODone 100 MG tablet Commonly known as: DESYREL Take 100 mg by mouth at bedtime.               Discharge Care Instructions  (From admission, onward)           Start     Ordered   05/26/21 0000  Discharge wound care:       Comments: Wound care  Daily      Comments: Wound care to right LE skin tear (avulsion, full  thickness):  Cleanse with NS, pat dry. Cover with folded piece of xeroform gauze, top with dry gauze and secure with a few turns of Kerlix roll gauze/paper tape. Change daily.   05/26/21 1205            Allergies  Allergen Reactions   Bupropion Other (See Comments), Nausea Only, Shortness Of Breath and Nausea And Vomiting    cold  sweats, extended release, only can take [facility] brand Issues with extended release medications. Other reaction(s): Unknown ER- cold sweats, extended release, only can take [facility] brand Issues with extended release medications.   Levaquin [Levofloxacin In D5w] Anaphylaxis   Methylprednisolone Acetate Other (See Comments)   Amoxicillin Itching   Codeine Nausea And Vomiting   Cymbalta [Duloxetine Hcl] Swelling   Gatifloxacin Hives and Swelling    Other Reaction: Other reaction: Swelling   Guaifenesin & Derivatives Hives and Swelling   Keflex [Cephalexin] Other (See Comments)    Unknown   Klonopin [Clonazepam] Other (See Comments)    "Makes me have bad thoughts"   Macrobid [Nitrofurantoin Monohyd Macro] Other (See Comments)    "kidneys shuts down" Chills, Fever.   Penicillins Hives    Has patient had a PCN reaction causing immediate rash, facial/tongue/throat swelling, SOB or lightheadedness with hypotension: Yes Has patient had a PCN reaction causing severe rash involving mucus membranes or skin necrosis: No Has patient had a PCN reaction that required hospitalization: No Has patient had a PCN reaction occurring within the last 10 years: No If all of the above answers are "NO", then may proceed with Cephalosporin use.   Sulfa Antibiotics Hives   Amitriptyline Rash    "It makes me want to fall."   Other Rash    cortisol    Consultations:    Procedures/Studies: DG Chest 2 View  Result Date: 05/24/2021 CLINICAL DATA:  Central chest pain and pleuritic chest pain. Recent fall with right rib fractures by CT. EXAM: CHEST - 2 VIEW COMPARISON:  CT of  the chest on 05/12/2021 FINDINGS: The heart size and mediastinal contours are within normal limits. There is no evidence of pulmonary edema, consolidation, pneumothorax, nodule or pleural fluid. The acute minimally displaced fractures of the lateral right seventh and eighth ribs seen by CT are difficult to appreciate by x-ray but show no evidence of significant displacement. Several old left-sided rib fractures are noted at the level of the fifth, sixth and eighth ribs. IMPRESSION: Recent lateral right seventh and eighth rib fractures are difficult to see by chest x-ray but show no significant displacement. No associated pneumothorax or pleural fluid. Old left-sided rib fractures again noted. Electronically Signed   By: Aletta Edouard M.D.   On: 05/24/2021 08:38   DG Tibia/Fibula Right  Result Date: 05/24/2021 CLINICAL DATA:  Fall EXAM: RIGHT TIBIA AND FIBULA - 2 VIEW COMPARISON:  None. FINDINGS: There is no evidence of fracture or other focal bone lesions. Soft tissues are unremarkable. IMPRESSION: Negative. Electronically Signed   By: Franchot Gallo M.D.   On: 05/24/2021 10:27   CT HEAD WO CONTRAST (5MM)  Result Date: 05/24/2021 CLINICAL DATA:  Fall EXAM: CT HEAD WITHOUT CONTRAST CT CERVICAL SPINE WITHOUT CONTRAST TECHNIQUE: Multidetector CT imaging of the head and cervical spine was performed following the standard protocol without intravenous contrast. Multiplanar CT image reconstructions of the cervical spine were also generated. COMPARISON:  CT head 05/12/2021 FINDINGS: CT HEAD FINDINGS Brain: No evidence of acute infarction, hemorrhage, hydrocephalus, extra-axial collection or mass lesion/mass effect. Mild atrophy Vascular: Negative for hyperdense vessel Skull: Negative for fracture Sinuses/Orbits: Prior sinus surgery. Paranasal sinuses clear. Negative orbit Other: None CT CERVICAL SPINE FINDINGS Alignment: 3 mm anterolisthesis C5-6. 6 mm anterolisthesis C6-7. 3 mm anterolisthesis C7-T1. Alignment is  similar to the prior CT 11/07/2020 Skull base and vertebrae: Negative for fracture Soft tissues and spinal canal: Negative for soft tissue mass or swelling. Disc levels: Disc degeneration facet  degeneration throughout the cervical spine. ACDF with solid fusion at C4-5. Upper chest: No acute abnormality Other: None IMPRESSION: 1. No acute intracranial abnormality 2. Negative for cervical spine fracture 3. Advanced spondylosis and spondylolisthesis. Electronically Signed   By: Franchot Gallo M.D.   On: 05/24/2021 10:39   CT HEAD WO CONTRAST (5MM)  Result Date: 05/12/2021 CLINICAL DATA:  Polytrauma, critical, head/C-spine injury suspected. Fall. EXAM: CT HEAD WITHOUT CONTRAST TECHNIQUE: Contiguous axial images were obtained from the base of the skull through the vertex without intravenous contrast. COMPARISON:  11/07/2020 FINDINGS: Brain: No acute intracranial abnormality. Specifically, no hemorrhage, hydrocephalus, mass lesion, acute infarction, or significant intracranial injury. Vascular: No hyperdense vessel or unexpected calcification. Skull: No acute calvarial abnormality. Sinuses/Orbits: No acute findings Other: None IMPRESSION: No acute intracranial abnormality. Electronically Signed   By: Rolm Baptise M.D.   On: 05/12/2021 21:12   CT Angio Chest PE W and/or Wo Contrast  Result Date: 05/24/2021 CLINICAL DATA:  60 year old female with chest pain, recent fall. Decreased lung sounds on the right. EXAM: CT ANGIOGRAPHY CHEST WITH CONTRAST TECHNIQUE: Multidetector CT imaging of the chest was performed using the standard protocol during bolus administration of intravenous contrast. Multiplanar CT image reconstructions and MIPs were obtained to evaluate the vascular anatomy. CONTRAST:  190m OMNIPAQUE IOHEXOL 350 MG/ML SOLN COMPARISON:  CT Chest, Abdomen, and Pelvis 05/12/2021. Thoracic and lumbar spine CT today reported separately. FINDINGS: Cardiovascular: Good contrast bolus timing in the pulmonary arterial  tree. No focal filling defect identified in the pulmonary arteries to suggest acute pulmonary embolism. No cardiomegaly or pericardial effusion. Negative thoracic aorta aside from mild calcified atherosclerosis. Mediastinum/Nodes: Negative. No mediastinal hematoma or lymphadenopathy. Lungs/Pleura: No pleural effusion. No pneumothorax. Lower lung volumes compared to last month. Major airways remain patent. Mild dependent atelectasis. No pulmonary contusion or other confluent opacity. Upper Abdomen: Negative visible liver, gallbladder, spleen, pancreas, adrenal glands and bowel in the upper abdomen. Mild nephrolithiasis. Musculoskeletal: Thoracic spine detailed separately. There is advanced lower cervical spondylolisthesis superimposed on a previous C4-C5 (or C3 through C5) ACDF. Osteopenia. Mildly displaced right lateral 7th and 8th rib fractures again noted. There are superimposed chronic right lateral 5, 6, 9 and 10 rib fractures. No new right rib fracture compared to last month. And right lateral chest wall soft tissue swelling situated between the lateral 7th and 8th ribs has decreased compatible with resolving soft tissue hematoma (series 4, image 67 today). No acute left rib fracture. Chronic left 4th and 5th rib fractures. Intact sternum. Review of the MIP images confirms the above findings. IMPRESSION: 1. No evidence of acute pulmonary embolus. 2. Lower lung volumes compared to last month with mild atelectasis. Mildly displaced right lateral 7th and 8th rib fractures again noted. Underlying chronic bilateral rib fractures. 3. No new traumatic injury identified in the chest. Thoracic Spine CT reported separately. Advanced lower cervical spondylolisthesis superimposed on previous ACDF. Electronically Signed   By: HGenevie AnnM.D.   On: 05/24/2021 10:40   CT Cervical Spine Wo Contrast  Result Date: 05/24/2021 CLINICAL DATA:  Fall EXAM: CT HEAD WITHOUT CONTRAST CT CERVICAL SPINE WITHOUT CONTRAST TECHNIQUE:  Multidetector CT imaging of the head and cervical spine was performed following the standard protocol without intravenous contrast. Multiplanar CT image reconstructions of the cervical spine were also generated. COMPARISON:  CT head 05/12/2021 FINDINGS: CT HEAD FINDINGS Brain: No evidence of acute infarction, hemorrhage, hydrocephalus, extra-axial collection or mass lesion/mass effect. Mild atrophy Vascular: Negative for hyperdense vessel Skull: Negative for  fracture Sinuses/Orbits: Prior sinus surgery. Paranasal sinuses clear. Negative orbit Other: None CT CERVICAL SPINE FINDINGS Alignment: 3 mm anterolisthesis C5-6. 6 mm anterolisthesis C6-7. 3 mm anterolisthesis C7-T1. Alignment is similar to the prior CT 11/07/2020 Skull base and vertebrae: Negative for fracture Soft tissues and spinal canal: Negative for soft tissue mass or swelling. Disc levels: Disc degeneration facet degeneration throughout the cervical spine. ACDF with solid fusion at C4-5. Upper chest: No acute abnormality Other: None IMPRESSION: 1. No acute intracranial abnormality 2. Negative for cervical spine fracture 3. Advanced spondylosis and spondylolisthesis. Electronically Signed   By: Franchot Gallo M.D.   On: 05/24/2021 10:39   CT Cervical Spine Wo Contrast  Result Date: 05/12/2021 CLINICAL DATA:  Fall. Polytrauma, critical, head/C-spine injury suspected EXAM: CT CERVICAL SPINE WITHOUT CONTRAST TECHNIQUE: Multidetector CT imaging of the cervical spine was performed without intravenous contrast. Multiplanar CT image reconstructions were also generated. COMPARISON:  11/07/2020 FINDINGS: Alignment: 4 mm of anterolisthesis of C5 on C6. 5 mm of anterolisthesis of C6 on C7. 3 mm of anterolisthesis of C7 on T1. Findings are stable. These are related to diffuse advanced degenerative facet disease. Skull base and vertebrae: No visible fracture. No focal bone lesion. Soft tissues and spinal canal: No prevertebral fluid or swelling. No visible canal  hematoma. Disc levels:  Prior anterior at C4-5. Upper chest: No acute findings Other: None IMPRESSION: Diffuse advanced degenerative disc disease and facet disease. Grade 1-2 anterolisthesis at multiple levels as above, stable since prior study. No acute bony abnormality. Electronically Signed   By: Rolm Baptise M.D.   On: 05/12/2021 21:17   CT ABDOMEN PELVIS W CONTRAST  Result Date: 05/24/2021 CLINICAL DATA:  59 year old female with chest pain, recent fall. Decreased lung sounds on the right. EXAM: CT ABDOMEN AND PELVIS WITH CONTRAST TECHNIQUE: Multidetector CT imaging of the abdomen and pelvis was performed using the standard protocol following bolus administration of intravenous contrast. CONTRAST:  190m OMNIPAQUE IOHEXOL 350 MG/ML SOLN COMPARISON:  CTA chest today. CT Chest, Abdomen, and Pelvis 05/12/2021. Lumbar spine CT today reported separately. FINDINGS: Lower chest: Reported separately today. Hepatobiliary: Stable since 2019, negative. Pancreas: Negative. Spleen: Negative. Adrenals/Urinary Tract: Normal adrenal glands. Punctate nephrolithiasis on the left. Nonobstructed kidneys with symmetric renal enhancement and contrast excretion. Negative ureters and bladder. Stomach/Bowel: Negative rectum. Coarsely calcified perirectal lymph node is small, stable, and likely postinflammatory. Sigmoid and descending diverticulosis without active inflammation. Negative transverse and right colon. Prior appendectomy. Negative terminal ileum. No dilated small bowel. Decompressed stomach and duodenum. No free air or free fluid. Vascular/Lymphatic: Aortoiliac calcified atherosclerosis. Major arterial structures in the abdomen and pelvis remain patent. Portal venous system is patent. No lymphadenopathy. Reproductive: Absent uterus.  Diminutive or absent ovaries. Other: No pelvic free fluid. Musculoskeletal: Lumbar spine reported separately today. Chronic left hip arthroplasty. Chronic right pubic rami fractures. No acute  osseous abnormality identified. IMPRESSION: 1. No acute or inflammatory process identified in the abdomen or pelvis. Lumbar spine CT reported separately. 2. Distal large bowel diverticulosis. Punctate nephrolithiasis. Aortic Atherosclerosis (ICD10-I70.0). Electronically Signed   By: HGenevie AnnM.D.   On: 05/24/2021 10:44   CT CHEST ABDOMEN PELVIS W CONTRAST  Result Date: 05/12/2021 CLINICAL DATA:  Chest trauma. EXAM: CT CHEST, ABDOMEN, AND PELVIS WITH CONTRAST TECHNIQUE: Multidetector CT imaging of the chest, abdomen and pelvis was performed following the standard protocol during bolus administration of intravenous contrast. CONTRAST:  1056mOMNIPAQUE IOHEXOL 300 MG/ML  SOLN COMPARISON:  Chest radiograph dated 11/07/2020. CT abdomen  pelvis dated 09/12/2019. FINDINGS: CT CHEST FINDINGS Cardiovascular: No cardiomegaly. Trace pericardial effusion. Coronary vascular calcification. Mild atherosclerotic calcification of the thoracic aorta. No aneurysmal dilatation or dissection. The origins of the great vessels of the aortic arch appear patent as visualized. The central pulmonary arteries appear unremarkable. Mediastinum/Nodes: No hilar or mediastinal adenopathy. The esophagus is grossly unremarkable. No mediastinal fluid collection. Lungs/Pleura: Bibasilar subpleural atelectasis. No focal consolidation, pleural effusion, or pneumothorax. The central airways are patent. Musculoskeletal: Degenerative changes of the spine and cervical ACDF. Multilevel cervical anterolisthesis. Minimally displaced fractures of the lateral right seventh and eighth ribs. Several additional old healed bilateral rib fractures noted. There is a 1.7 x 2.7 cm integrated area in the lateral right chest wall (39/3), possibly a small hematoma or seroma related to traumatic injury. No large fluid collection. CT ABDOMEN PELVIS FINDINGS No intra-abdominal free air or free fluid. Hepatobiliary: The liver is unremarkable. There is mild intrahepatic  biliary dilatation. The gallbladder is unremarkable. Pancreas: Unremarkable. No pancreatic ductal dilatation or surrounding inflammatory changes. Spleen: Normal in size without focal abnormality. Adrenals/Urinary Tract: The adrenal glands are unremarkable. There is a 3 mm nonobstructing left renal upper pole calculus. No hydronephrosis. There is symmetric enhancement and excretion of contrast by both kidneys. The visualized ureters and urinary bladder appear unremarkable. Stomach/Bowel: There is sigmoid diverticulosis without active inflammatory changes. There is no bowel obstruction or active inflammation. Appendectomy. Vascular/Lymphatic: Mild aortoiliac atherosclerotic disease. The IVC is unremarkable. No portal gas. There is no adenopathy. Reproductive: Hysterectomy. Other: None Musculoskeletal: Degenerative changes of the spine. Total left hip arthroplasty. Old right pubic bone fracture. No acute osseous pathology. IMPRESSION: 1. Minimally displaced fractures of the lateral right seventh and eighth ribs. No pneumothorax. 2. A 1.7 x 2.7 cm integrated area in the lateral right chest wall, possibly a small hematoma or seroma related to traumatic injury. No large fluid collection. 3. Sigmoid diverticulosis. No bowel obstruction. 4. Aortic Atherosclerosis (ICD10-I70.0). Electronically Signed   By: Anner Crete M.D.   On: 05/12/2021 21:21   CT T-SPINE NO CHARGE  Result Date: 05/24/2021 CLINICAL DATA:  60 year old female with pain, recent fall. EXAM: CT THORACIC SPINE WITH CONTRAST TECHNIQUE: Multiplanar CT images of the thoracic spine were reconstructed from contemporary CTA of the Chest. CONTRAST:  No additional COMPARISON:  CT cervical spine and CTA chest today reported separately. Thoracic spine MRI 01/19/2021. FINDINGS: Limited cervical spine imaging: Multilevel lower cervical spondylolisthesis, including 6-7 mm anterolisthesis of C6 on C7, stable since July. Thoracic spine segmentation:  Normal. Alignment:  Stable thoracic kyphosis since July. No thoracic spondylolisthesis. Vertebrae: Osteopenia. Thoracic vertebrae appear stable and intact. Rib details are reported separately today. Paraspinal and other soft tissues: Thoracic paraspinal soft tissues are within normal limits. Chest and abdominal viscera are reported separately today. Disc levels: Capacious thoracic spinal canal as seen in July. No CT evidence of spinal stenosis. Chronic thoracic facet hypertrophy, stable since July. IMPRESSION: 1. No acute traumatic injury identified in the thoracic spine. Osteopenia. 2. Severe lower cervical spine spondylolisthesis. See CT cervical spine today reported Separately. 3. Mild for age thoracic spine degeneration is stable since a July MRI. 4. CT Chest, Abdomen, and Pelvis today reported separately. Electronically Signed   By: Genevie Ann M.D.   On: 05/24/2021 10:50   CT L-SPINE NO CHARGE  Result Date: 05/24/2021 CLINICAL DATA:  60 year old female with chest pain, recent fall. Decreased lung sounds on the right. EXAM: CT LUMBAR SPINE WITH CONTRAST TECHNIQUE: Technique: Multiplanar CT images of the  lumbar spine were reconstructed from contemporary CT of the Abdomen and Pelvis. CONTRAST:  No additional COMPARISON:  CT Chest, Abdomen, and Pelvis today are reported separately. Lumbar MRI 01/19/2021. CT Abdomen and Pelvis 09/12/2019 FINDINGS: Segmentation: Normal, concordant with the thoracic spine numbering today. Alignment: Lumbar lordosis is stable since July. Anterolisthesis of L3 on L4 is less apparent now. Vertebrae: Osteopenia. Chronic right L3 transverse process fracture (series 1, image 53) stable since last year. No acute osseous abnormality identified. In the lumbar spine. Visible sacrum and SI joints appear intact. Paraspinal and other soft tissues: Abdominal and pelvic viscera are reported separately. Lumbar paraspinal soft tissues remain normal. Disc levels: Capacious spinal canal at most levels as seen in July.  Stable disc bulging at L3-L4 and L4-L5 with up to mild spinal stenosis at the former. IMPRESSION: 1. Osteopenia. No acute traumatic injury identified in the lumbar spine. Chronic right L3 transverse process fracture. 2. Stable mild lumbar spine degeneration since an MRI in July, disc bulging at L3-L4 with up to mild spinal stenosis. 3. CT Abdomen and Pelvis today reported separately. Electronically Signed   By: Genevie Ann M.D.   On: 05/24/2021 10:54   DG Knee Complete 4 Views Left  Result Date: 05/24/2021 CLINICAL DATA:  60 year old female with pain, recent fall. EXAM: LEFT KNEE - COMPLETE 4+ VIEW COMPARISON:  None. FINDINGS: Bone mineralization is within normal limits. No evidence of fracture, dislocation, or joint effusion. No evidence of arthropathy or other focal bone abnormality. No discrete soft tissue injury. IMPRESSION: Negative. Electronically Signed   By: Genevie Ann M.D.   On: 05/24/2021 10:46   DG Knee Complete 4 Views Right  Result Date: 05/24/2021 CLINICAL DATA:  60 year old female with pain, recent fall. EXAM: RIGHT KNEE - COMPLETE 4+ VIEW COMPARISON:  Right tib fib series 03/20/2015. FINDINGS: Bone mineralization is within normal limits. No evidence of fracture, dislocation, or joint effusion. No evidence of arthropathy or other focal bone abnormality. No discrete soft tissue injury. IMPRESSION: Negative. Electronically Signed   By: Genevie Ann M.D.   On: 05/24/2021 10:46   DG Hip Unilat W or Wo Pelvis 2-3 Views Right  Result Date: 05/12/2021 CLINICAL DATA:  Fall, right hip pain EXAM: DG HIP (WITH OR WITHOUT PELVIS) 2-3V RIGHT COMPARISON:  None. FINDINGS: Prior left hip replacement. Remote healed right superior and inferior pubic rami fractures. No acute fracture, subluxation or dislocation. IMPRESSION: No acute bony abnormality. Electronically Signed   By: Rolm Baptise M.D.   On: 05/12/2021 20:47   (Echo, Carotid, EGD, Colonoscopy, ERCP)    Subjective: Pt c/o anxiety    Discharge Exam: Vitals:    05/26/21 0512 05/26/21 0759  BP: 126/76 135/67  Pulse: 87 91  Resp: 18 18  Temp: 98.2 F (36.8 C) 98.3 F (36.8 C)  SpO2: 100% 99%   Vitals:   05/25/21 2008 05/25/21 2111 05/26/21 0512 05/26/21 0759  BP: 127/85 133/79 126/76 135/67  Pulse: 82 90 87 91  Resp: 16 20 18 18   Temp: 97.9 F (36.6 C) (!) 97.4 F (36.3 C) 98.2 F (36.8 C) 98.3 F (36.8 C)  TempSrc:      SpO2: 96% 98% 100% 99%  Weight:      Height:        General: Pt is alert, awake, not in acute distress Cardiovascular:S1/S2 +, no rubs, no gallops Respiratory: CTA bilaterally, no wheezing, no rhonchi Abdominal: Soft, NT, ND, bowel sounds + Extremities: no cyanosis    The results of significant  diagnostics from this hospitalization (including imaging, microbiology, ancillary and laboratory) are listed below for reference.     Microbiology: Recent Results (from the past 240 hour(s))  Resp Panel by RT-PCR (Flu A&B, Covid) Nasopharyngeal Swab     Status: None   Collection Time: 05/24/21 10:29 AM   Specimen: Nasopharyngeal Swab; Nasopharyngeal(NP) swabs in vial transport medium  Result Value Ref Range Status   SARS Coronavirus 2 by RT PCR NEGATIVE NEGATIVE Final    Comment: (NOTE) SARS-CoV-2 target nucleic acids are NOT DETECTED.  The SARS-CoV-2 RNA is generally detectable in upper respiratory specimens during the acute phase of infection. The lowest concentration of SARS-CoV-2 viral copies this assay can detect is 138 copies/mL. A negative result does not preclude SARS-Cov-2 infection and should not be used as the sole basis for treatment or other patient management decisions. A negative result may occur with  improper specimen collection/handling, submission of specimen other than nasopharyngeal swab, presence of viral mutation(s) within the areas targeted by this assay, and inadequate number of viral copies(<138 copies/mL). A negative result must be combined with clinical observations, patient  history, and epidemiological information. The expected result is Negative.  Fact Sheet for Patients:  EntrepreneurPulse.com.au  Fact Sheet for Healthcare Providers:  IncredibleEmployment.be  This test is no t yet approved or cleared by the [place] FDA and  has been authorized for detection and/or diagnosis of SARS-CoV-2 by FDA under an Emergency Use Authorization (EUA). This EUA will remain  in effect (meaning this test can be used) for the duration of the COVID-19 declaration under Section 564(b)(1) of the Act, 21 U.S.C.section 360bbb-3(b)(1), unless the authorization is terminated  or revoked sooner.       Influenza A by PCR NEGATIVE NEGATIVE Final   Influenza B by PCR NEGATIVE NEGATIVE Final    Comment: (NOTE) The Xpert Xpress SARS-CoV-2/FLU/RSV plus assay is intended as an aid in the diagnosis of influenza from Nasopharyngeal swab specimens and should not be used as a sole basis for treatment. Nasal washings and aspirates are unacceptable for Xpert Xpress SARS-CoV-2/FLU/RSV testing.  Fact Sheet for Patients: EntrepreneurPulse.com.au  Fact Sheet for Healthcare Providers: IncredibleEmployment.be  This test is not yet approved or cleared by the [place] FDA and has been authorized for detection and/or diagnosis of SARS-CoV-2 by FDA under an Emergency Use Authorization (EUA). This EUA will remain in effect (meaning this test can be used) for the duration of the COVID-19 declaration under Section 564(b)(1) of the Act, 21 U.S.C. section 360bbb-3(b)(1), unless the authorization is terminated or revoked.  Performed at [facility], [address] [place],  [address]      Labs: BNP (last 3 results) Recent Labs    05/13/21 0758  BNP 74.8   Basic Metabolic Panel: Recent Labs  Lab 05/24/21 0635 05/24/21 0910 05/25/21 0402 05/26/21 0617  NA 138  --  138 141  K 3.4*   --  4.1 4.4  CL 96*  --  104 104  CO2 31  --  27 29  GLUCOSE 140*  --  114* 115*  BUN 16  --  12 13  CREATININE 0.87  --  0.88 0.88  CALCIUM 9.1  --  8.8* 9.4  MG  --  2.1  --   --    Liver Function Tests: Recent Labs  Lab 05/24/21 0910  AST 23  ALT 26  ALKPHOS 93  BILITOT 0.8  PROT 7.0  ALBUMIN 3.5   No results for input(s): LIPASE,  AMYLASE in the last 168 hours. No results for input(s): AMMONIA in the last 168 hours. CBC: Recent Labs  Lab 05/24/21 0635 05/25/21 0402 05/26/21 0617  WBC 10.6* 8.8 9.4  HGB 13.4 11.1* 12.9  HCT 42.4 35.4* 40.8  MCV 95.3 95.9 95.1  PLT 350 286 297   Cardiac Enzymes: Recent Labs  Lab 05/24/21 0910  CKTOTAL 27*   BNP: Invalid input(s): POCBNP CBG: No results for input(s): GLUCAP in the last 168 hours. D-Dimer No results for input(s): DDIMER in the last 72 hours. Hgb A1c No results for input(s): HGBA1C in the last 72 hours. Lipid Profile No results for input(s): CHOL, HDL, LDLCALC, TRIG, CHOLHDL, LDLDIRECT in the last 72 hours. Thyroid function studies Recent Labs    05/25/21 0402  TSH 1.986   Anemia work up Recent Labs    05/25/21 0402  VITAMINB12 586   Urinalysis    Component Value Date/Time   COLORURINE YELLOW (A) 05/24/2021 1214   APPEARANCEUR CLEAR (A) 05/24/2021 1214   LABSPEC >1.046 (H) 05/24/2021 1214   PHURINE 7.0 05/24/2021 1214   GLUCOSEU NEGATIVE 05/24/2021 1214   HGBUR NEGATIVE 05/24/2021 1214   [place] 05/24/2021 1214   KETONESUR NEGATIVE 05/24/2021 1214   PROTEINUR NEGATIVE 05/24/2021 1214   NITRITE NEGATIVE 05/24/2021 1214   LEUKOCYTESUR NEGATIVE 05/24/2021 1214   Sepsis Labs Invalid input(s): PROCALCITONIN,  WBC,  LACTICIDVEN Microbiology Recent Results (from the past 240 hour(s))  Resp Panel by RT-PCR (Flu A&B, Covid) Nasopharyngeal Swab     Status: None   Collection Time: 05/24/21 10:29 AM   Specimen: Nasopharyngeal Swab; Nasopharyngeal(NP) swabs in vial transport medium   Result Value Ref Range Status   SARS Coronavirus 2 by RT PCR NEGATIVE NEGATIVE Final    Comment: (NOTE) SARS-CoV-2 target nucleic acids are NOT DETECTED.  The SARS-CoV-2 RNA is generally detectable in upper respiratory specimens during the acute phase of infection. The lowest concentration of SARS-CoV-2 viral copies this assay can detect is 138 copies/mL. A negative result does not preclude SARS-Cov-2 infection and should not be used as the sole basis for treatment or other patient management decisions. A negative result may occur with  improper specimen collection/handling, submission of specimen other than nasopharyngeal swab, presence of viral mutation(s) within the areas targeted by this assay, and inadequate number of viral copies(<138 copies/mL). A negative result must be combined with clinical observations, patient history, and epidemiological information. The expected result is Negative.  Fact Sheet for Patients:  EntrepreneurPulse.com.au  Fact Sheet for Healthcare Providers:  IncredibleEmployment.be  This test is no t yet approved or cleared by the [place] FDA and  has been authorized for detection and/or diagnosis of SARS-CoV-2 by FDA under an Emergency Use Authorization (EUA). This EUA will remain  in effect (meaning this test can be used) for the duration of the COVID-19 declaration under Section 564(b)(1) of the Act, 21 U.S.C.section 360bbb-3(b)(1), unless the authorization is terminated  or revoked sooner.       Influenza A by PCR NEGATIVE NEGATIVE Final   Influenza B by PCR NEGATIVE NEGATIVE Final    Comment: (NOTE) The Xpert Xpress SARS-CoV-2/FLU/RSV plus assay is intended as an aid in the diagnosis of influenza from Nasopharyngeal swab specimens and should not be used as a sole basis for treatment. Nasal washings and aspirates are unacceptable for Xpert Xpress SARS-CoV-2/FLU/RSV testing.  Fact Sheet for  Patients: EntrepreneurPulse.com.au  Fact Sheet for Healthcare Providers: IncredibleEmployment.be  This test is not yet approved or cleared by  the [employer] and has been authorized for detection and/or diagnosis of SARS-CoV-2 by FDA under an Emergency Use Authorization (EUA). This EUA will remain in effect (meaning this test can be used) for the duration of the COVID-19 declaration under Section 564(b)(1) of the Act, 21 U.S.C. section 360bbb-3(b)(1), unless the authorization is terminated or revoked.  Performed at [facility], [address] [place], [place] [address]      Time coordinating discharge: Over 30 minutes  SIGNED:   Wyvonnia Dusky, MD  [facility] 05/26/2021, 2:27 PM Pager   If 7PM-7AM, please contact night-coverage

## 2021-05-26 NOTE — Progress Notes (Signed)
Pt refused morning dressing change to RLE.   Earleen Reaper, RN

## 2021-05-26 NOTE — Plan of Care (Signed)

## 2021-05-26 NOTE — TOC Progression Note (Signed)
Transition of Care [facility]) - Progression Note    Patient Details  Name: [name] MRN: [id number] Date of Birth: 1961-02-07  [facility] CM/SW [place], RN Phone Number: 05/26/2021, 10:57 AM  Clinical Narrative:    Met with the patient in the room to discuss DC plan and needs She has someone currently moving in to help her, she uses [facility] transportation when she is unable to find someone to take her, She has used [facility] in the past and they will be able to set her up again, Nursing and PT, she has A Wheelchair and a RW and 3 in 1 at home and does not need additional DME        Expected Discharge Plan and Services                                                 Social Determinants of Health (SDOH) Interventions    Readmission Risk Interventions No flowsheet data found.

## 2021-07-29 ENCOUNTER — Encounter: Payer: Self-pay | Admitting: Emergency Medicine

## 2021-07-29 ENCOUNTER — Emergency Department: Payer: Medicare Other

## 2021-07-29 ENCOUNTER — Emergency Department
Admission: EM | Admit: 2021-07-29 | Discharge: 2021-07-29 | Disposition: A | Discharge status: Home / Self Care | Payer: Medicare Other | Attending: Emergency Medicine | Admitting: Emergency Medicine

## 2021-07-29 DIAGNOSIS — W19XXXA Unspecified fall, initial encounter: Secondary | ICD-10-CM | POA: Diagnosis not present

## 2021-07-29 DIAGNOSIS — Z85828 Personal history of other malignant neoplasm of skin: Secondary | ICD-10-CM | POA: Diagnosis not present

## 2021-07-29 DIAGNOSIS — M542 Cervicalgia: Secondary | ICD-10-CM | POA: Insufficient documentation

## 2021-07-29 DIAGNOSIS — S8991XA Unspecified injury of right lower leg, initial encounter: Secondary | ICD-10-CM | POA: Diagnosis present

## 2021-07-29 DIAGNOSIS — M79631 Pain in right forearm: Secondary | ICD-10-CM | POA: Insufficient documentation

## 2021-07-29 DIAGNOSIS — R4182 Altered mental status, unspecified: Secondary | ICD-10-CM | POA: Insufficient documentation

## 2021-07-29 DIAGNOSIS — I5032 Chronic diastolic (congestive) heart failure: Secondary | ICD-10-CM | POA: Insufficient documentation

## 2021-07-29 DIAGNOSIS — E876 Hypokalemia: Secondary | ICD-10-CM | POA: Diagnosis not present

## 2021-07-29 DIAGNOSIS — S81811A Laceration without foreign body, right lower leg, initial encounter: Secondary | ICD-10-CM

## 2021-07-29 DIAGNOSIS — T50901A Poisoning by unspecified drugs, medicaments and biological substances, accidental (unintentional), initial encounter: Secondary | ICD-10-CM | POA: Diagnosis not present

## 2021-07-29 DIAGNOSIS — N179 Acute kidney failure, unspecified: Secondary | ICD-10-CM

## 2021-07-29 DIAGNOSIS — J449 Chronic obstructive pulmonary disease, unspecified: Secondary | ICD-10-CM | POA: Diagnosis not present

## 2021-07-29 DIAGNOSIS — F0390 Unspecified dementia without behavioral disturbance: Secondary | ICD-10-CM | POA: Insufficient documentation

## 2021-07-29 LAB — BLOOD GAS, VENOUS
Acid-Base Excess: 11.1 mmol/L — ABNORMAL HIGH (ref 0.0–2.0)
Bicarbonate: 37.6 mmol/L — ABNORMAL HIGH (ref 20.0–28.0)
O2 Saturation: 65 %
Patient temperature: 37
pCO2, Ven: 58 mmHg (ref 44.0–60.0)
pH, Ven: 7.42 (ref 7.250–7.430)
pO2, Ven: 33 mmHg (ref 32.0–45.0)

## 2021-07-29 LAB — URINALYSIS, COMPLETE (UACMP) WITH MICROSCOPIC
Bilirubin Urine: NEGATIVE
Glucose, UA: 50 mg/dL — AB
Ketones, ur: NEGATIVE mg/dL
Nitrite: NEGATIVE
Protein, ur: NEGATIVE mg/dL
Specific Gravity, Urine: 1.013 (ref 1.005–1.030)
pH: 5 (ref 5.0–8.0)

## 2021-07-29 LAB — URINE DRUG SCREEN, QUALITATIVE (ARMC ONLY)
Amphetamines, Ur Screen: NOT DETECTED
Barbiturates, Ur Screen: NOT DETECTED
Benzodiazepine, Ur Scrn: POSITIVE — AB
Cannabinoid 50 Ng, Ur ~~LOC~~: NOT DETECTED
Cocaine Metabolite,Ur ~~LOC~~: NOT DETECTED
MDMA (Ecstasy)Ur Screen: NOT DETECTED
Methadone Scn, Ur: NOT DETECTED
Opiate, Ur Screen: POSITIVE — AB
Phencyclidine (PCP) Ur S: NOT DETECTED
Tricyclic, Ur Screen: POSITIVE — AB

## 2021-07-29 LAB — BASIC METABOLIC PANEL
Anion gap: 14 (ref 5–15)
BUN: 33 mg/dL — ABNORMAL HIGH (ref 6–20)
CO2: 30 mmol/L (ref 22–32)
Calcium: 9.5 mg/dL (ref 8.9–10.3)
Chloride: 89 mmol/L — ABNORMAL LOW (ref 98–111)
Creatinine, Ser: 1.24 mg/dL — ABNORMAL HIGH (ref 0.44–1.00)
GFR, Estimated: 50 mL/min — ABNORMAL LOW (ref >60)
Glucose, Bld: 207 mg/dL — ABNORMAL HIGH (ref 70–99)
Potassium: 2.8 mmol/L — ABNORMAL LOW (ref 3.5–5.1)
Sodium: 133 mmol/L — ABNORMAL LOW (ref 135–145)

## 2021-07-29 LAB — CBC
HCT: 34.2 % — ABNORMAL LOW (ref 36.0–46.0)
Hemoglobin: 11.1 g/dL — ABNORMAL LOW (ref 12.0–15.0)
MCH: 29.1 pg (ref 26.0–34.0)
MCHC: 32.5 g/dL (ref 30.0–36.0)
MCV: 89.8 fL (ref 80.0–100.0)
Platelets: 309 10*3/uL (ref 150–400)
RBC: 3.81 MIL/uL — ABNORMAL LOW (ref 3.87–5.11)
RDW: 14 % (ref 11.5–15.5)
WBC: 17.7 10*3/uL — ABNORMAL HIGH (ref 4.0–10.5)
nRBC: 0 % (ref 0.0–0.2)

## 2021-07-29 LAB — CK: Total CK: 178 U/L (ref 38–234)

## 2021-07-29 LAB — ETHANOL: Alcohol, Ethyl (B): <10 mg/dL (ref <10)

## 2021-07-29 MED ORDER — BACITRACIN-NEOMYCIN-POLYMYXIN 400-5-5000 EX OINT
TOPICAL_OINTMENT | Freq: Once | CUTANEOUS | Status: AC
  Administered 2021-07-29: 1 via TOPICAL
  Filled 2021-07-29: qty 1

## 2021-07-29 MED ORDER — BACITRACIN ZINC 500 UNIT/GM EX OINT: TOPICAL_OINTMENT | Freq: Two times a day (BID) | CUTANEOUS | Status: DC

## 2021-07-29 MED ORDER — LACTATED RINGERS IV BOLUS
1000.0000 mL | Freq: Once | INTRAVENOUS | Status: AC
  Administered 2021-07-29: 1000 mL via INTRAVENOUS

## 2021-07-29 MED ORDER — LACTATED RINGERS IV BOLUS
500.0000 mL | Freq: Once | INTRAVENOUS | Status: AC
  Administered 2021-07-29: 500 mL via INTRAVENOUS

## 2021-07-29 MED ORDER — BACITRACIN ZINC 500 UNIT/GM EX OINT
TOPICAL_OINTMENT | Freq: Once | CUTANEOUS | Status: AC
  Administered 2021-07-29: 1 via TOPICAL

## 2021-07-29 MED ORDER — POTASSIUM CHLORIDE CRYS ER 20 MEQ PO TBCR
40.0000 meq | EXTENDED_RELEASE_TABLET | Freq: Once | ORAL | Status: AC
  Administered 2021-07-29: 40 meq via ORAL
  Filled 2021-07-29: qty 2

## 2021-07-29 MED ORDER — NALOXONE HCL 4 MG/0.1ML NA LIQD
1.0000 | Freq: Once | NASAL | Status: AC
  Administered 2021-07-29: 1 via NASAL

## 2021-07-29 NOTE — ED Notes (Signed)
Radiology at the bedside to perform portable x-rays

## 2021-07-29 NOTE — ED Notes (Signed)
Assisted patient to the commode. Instructed to pull call light when she is done. Patient verbalized understanding.

## 2021-07-29 NOTE — ED Notes (Signed)
IV attempts x 2 in the right wrist and right AC without success. MD notified and IV consult ordered.

## 2021-07-29 NOTE — ED Notes (Signed)
Pt placed on 3L oxygen.

## 2021-07-29 NOTE — ED Notes (Signed)
Patient to CT.

## 2021-07-29 NOTE — ED Notes (Signed)
O2 turned off per Dr. Alfred Levins to assess oxygen demand. After 5-10 minutes, patient's O2 sats are maintaining at 93-95% on room air. MD notified and aware

## 2021-07-29 NOTE — ED Notes (Signed)
Still awaiting IV team to start IV and for blood draw.

## 2021-07-29 NOTE — ED Notes (Signed)
IV team at the bedside. 

## 2021-07-29 NOTE — ED Provider Notes (Signed)
[facility] Provider Note    Event Date/Time   First MD Initiated Contact with Patient 07/29/21 [phone]     (approximate)   History   Altered Mental Status and Fall   HPI  [name] is a 61 y.o. female with a history of adrenal insufficiency, diastolic CHF, chronic pain, Cushing's disease, dementia, COPD who presents after a fall.  Patient was found altered on the floor of the bathroom by her husband and with approximately 200 cc of blood on the floor from a large skin tear on the left leg.  Patient reports taking 1 Percocet in the last 24 hours and 3-4 Xanax.  Patient is extremely intoxicated on arrival, slurred speech, decreased respiratory rate with increased CO2 at 43.  Denies any other drugs or alcohol use.  Patient does not remember falling and does not know how long she was on the floor she is complaining of pain in her right forearm and left leg.  She is also complaining of neck pain.  Level 5 caveat:  Portions of the history and physical were unable to be obtained due to intoxication      Past Medical History:  Diagnosis Date   Adrenal insufficiency (HCC)    Anxiety    CAD (coronary artery disease)    Cancer (HCC)    skin   Chronic diastolic CHF (congestive heart failure) (HCC)    Chronic pain    COPD (chronic obstructive pulmonary disease) ([place])    Cushing disease (HCC)    Dementia (HCC)    GERD (gastroesophageal reflux disease)    HLD (hyperlipidemia)     Past Surgical History:  Procedure Laterality Date   APPENDECTOMY     HIP CLOSED REDUCTION Left 12/06/2019   Procedure: CLOSED MANIPULATION HIP;  Surgeon: Lovell Sheehan, MD;  Location: [facility] ORS;  Service: Orthopedics;  Laterality: Left;   NASAL SEPTUM SURGERY     NASAL SINUS SURGERY     UMBILICAL HERNIA REPAIR     VAGINAL HYSTERECTOMY       Physical Exam   Triage Vital Signs: ED Triage Vitals [07/29/21 0254]  Enc Vitals Group     BP 100/80     Pulse Rate (!) 115     Resp  12     Temp 97.8 F (36.6 C)     Temp Source Oral     SpO2 (!) 88 %     Weight      Height      Head Circumference      Peak Flow      Pain Score      Pain Loc      Pain Edu?      Excl. in [place]?     Most recent vital signs: Vitals:   07/29/21 0658 07/29/21 0720  BP: 128/78 119/73  Pulse: (!) 115 (!) 111  Resp: 19 17  Temp:    SpO2: 96% 94%     Constitutional: Somnolent with respiratory depression, slurring her speech  HEENT:      Head: Normocephalic and atraumatic.         Eyes: Conjunctivae are normal. Sclera is non-icteric.       Mouth/Throat: Mucous membranes are moist.       Neck: Supple with no signs of meningismus. Cardiovascular: Regular rate and rhythm. No murmurs, gallops, or rubs. 2+ symmetrical distal pulses are present in all extremities.  Respiratory: Decreased respiratory rate, hypoxic to the mid 80s on room air  with increased end tidal CO2  Gastrointestinal: Soft, non tender, and non distended with positive bowel sounds. No rebound or guarding. Genitourinary: No CVA tenderness. Musculoskeletal:  No edema, cyanosis, or erythema of extremities.  Large skin tear of the left lower extremity and bruising of the right forearm with no obvious deformities.  Full painless range of motion of knees and hips.  No midline spine tenderness Neurologic: Slurred speech. Face is symmetric. Moving all extremities. No gross focal neurologic deficits are appreciated. Skin: Skin is warm, dry and intact. No rash noted. Psychiatric: Mood and affect are normal. Speech and behavior are normal.  ED Results / Procedures / Treatments   Labs (all labs ordered are listed, but only abnormal results are displayed) Labs Reviewed  CBC - Abnormal; Notable for the following components:      Result Value   WBC 17.7 (*)    RBC 3.81 (*)    Hemoglobin 11.1 (*)    HCT 34.2 (*)    All other components within normal limits  BASIC METABOLIC PANEL - Abnormal; Notable for the following components:    Sodium 133 (*)    Potassium 2.8 (*)    Chloride 89 (*)    Glucose, Bld 207 (*)    BUN 33 (*)    Creatinine, Ser 1.24 (*)    GFR, Estimated 50 (*)    All other components within normal limits  BLOOD GAS, VENOUS - Abnormal; Notable for the following components:   Bicarbonate 37.6 (*)    Acid-Base Excess 11.1 (*)    All other components within normal limits  ETHANOL  CK  URINE DRUG SCREEN, QUALITATIVE ([facility] ONLY)  URINALYSIS, COMPLETE (UACMP) WITH MICROSCOPIC     EKG  ED ECG REPORT I, Rudene Re, the attending physician, personally viewed and interpreted this ECG.  Sinus tachycardia with a rate of 113, diffuse ST depressions with no ST elevation.  Worse when compared to prior   RADIOLOGY I have personally reviewed the images performed during this visit and interpret them as below:  CT head and cervical spine with no acute traumatic injury  X-rays of the chest, right forearm, and left lower leg with no acute fractures or dislocation   ___________________________________________________ Interpretation by Radiologist:  DG Chest 1 View  Result Date: 07/29/2021 CLINICAL DATA:  Found on floor EXAM: CHEST  1 VIEW COMPARISON:  06/13/2021 FINDINGS: Right base atelectasis. Slight elevation of the right hemidiaphragm. Heart is upper limits normal in size. No effusions. Left lung clear. No acute bony abnormality. IMPRESSION: Minimal right base atelectasis. Electronically Signed   By: Rolm Baptise M.D.   On: 07/29/2021 03:27   DG Forearm Right  Result Date: 07/29/2021 CLINICAL DATA:  Fall EXAM: RIGHT FOREARM - 2 VIEW COMPARISON:  None. FINDINGS: There is no evidence of fracture or other focal bone lesions. Soft tissues are unremarkable. IMPRESSION: Negative. Electronically Signed   By: Rolm Baptise M.D.   On: 07/29/2021 03:28   DG Tibia/Fibula Left  Result Date: 07/29/2021 CLINICAL DATA:  Fall and left knee pain. EXAM: LEFT TIBIA AND FIBULA - 2 VIEW COMPARISON:  Left knee  radiograph dated 05/24/2021 FINDINGS: Dated there is no acute fracture or dislocation. The bones are well mineralized. No significant arthritic changes. The soft tissues are unremarkable. IMPRESSION: Negative. Electronically Signed   By: Anner Crete M.D.   On: 07/29/2021 03:27   CT Head Wo Contrast  Result Date: 07/29/2021 CLINICAL DATA:  Found on floor.  Leg laceration. EXAM: CT HEAD  WITHOUT CONTRAST CT CERVICAL SPINE WITHOUT CONTRAST TECHNIQUE: Multidetector CT imaging of the head and cervical spine was performed following the standard protocol without intravenous contrast. Multiplanar CT image reconstructions of the cervical spine were also generated. COMPARISON:  05/24/2021 FINDINGS: CT HEAD FINDINGS Brain: No evidence of acute infarction, hemorrhage, hydrocephalus, extra-axial collection or mass lesion/mass effect. Vascular: No hyperdense vessel or unexpected calcification. Skull: Normal. Negative for fracture or focal lesion. Sinuses/Orbits: No acute finding. CT CERVICAL SPINE FINDINGS Alignment: Anterolisthesis at C5-6, C6-7, and C7-T1, up to 8 mm at C6-7. Slip is related to severe facet osteoarthritis and is chronic. Skull base and vertebrae: Chronic fragmentation at the C7 left superior articular process. No acute fracture or aggressive bone lesion. Soft tissues and spinal canal: No prevertebral fluid or swelling. No visible canal hematoma. Disc levels: Severe cervical facet degeneration with lower cervical anterolisthesis noted above. C4-5 ACDF with solid arthrodesis Upper chest: No emergent finding IMPRESSION: No evidence of acute intracranial or cervical spine injury. Electronically Signed   By: Jorje Guild M.D.   On: 07/29/2021 04:09   CT Cervical Spine Wo Contrast  Result Date: 07/29/2021 CLINICAL DATA:  Found on floor.  Leg laceration. EXAM: CT HEAD WITHOUT CONTRAST CT CERVICAL SPINE WITHOUT CONTRAST TECHNIQUE: Multidetector CT imaging of the head and cervical spine was performed  following the standard protocol without intravenous contrast. Multiplanar CT image reconstructions of the cervical spine were also generated. COMPARISON:  05/24/2021 FINDINGS: CT HEAD FINDINGS Brain: No evidence of acute infarction, hemorrhage, hydrocephalus, extra-axial collection or mass lesion/mass effect. Vascular: No hyperdense vessel or unexpected calcification. Skull: Normal. Negative for fracture or focal lesion. Sinuses/Orbits: No acute finding. CT CERVICAL SPINE FINDINGS Alignment: Anterolisthesis at C5-6, C6-7, and C7-T1, up to 8 mm at C6-7. Slip is related to severe facet osteoarthritis and is chronic. Skull base and vertebrae: Chronic fragmentation at the C7 left superior articular process. No acute fracture or aggressive bone lesion. Soft tissues and spinal canal: No prevertebral fluid or swelling. No visible canal hematoma. Disc levels: Severe cervical facet degeneration with lower cervical anterolisthesis noted above. C4-5 ACDF with solid arthrodesis Upper chest: No emergent finding IMPRESSION: No evidence of acute intracranial or cervical spine injury. Electronically Signed   By: Jorje Guild M.D.   On: 07/29/2021 04:09      PROCEDURES:  Critical Care performed: No  Procedures    IMPRESSION / MDM / ASSESSMENT AND PLAN / ED COURSE  I reviewed the triage vital signs and the nursing notes.  61 y.o. female with a history of adrenal insufficiency, diastolic CHF, chronic pain, Cushing's disease, dementia, COPD who presents after a unwitnessed fall.  Patient arrives extremely intoxicated, slurring her speech, somnolent, decreased respiratory rate, hypoxic with increased end-tidal CO2.  Has a history of chronic pain.  Patient is on Percocets and Xanax at home.  She has a large skin tear to the left lower extremity and bruising to the right forearm.  No active bleeding.  No other signs of trauma based on physical exam.   Ddx: Alcohol intoxication, opiate overdose, benzo overdose,  concussion, intracranial bleed, hypercapnia, sepsis   Plan: CT head and cervical spine, chest x-ray, x-ray of the right forearm and left lower leg, VBG, CBC, chemistry panel, alcohol level, UDS, CK, EKG.  Patient placed on telemetry for monitoring of cardiorespiratory status and respiratory depression.  Patient was given 2 mg of intranasal Narcan with improvement of her mental state and respiratory rate.   MEDICATIONS GIVEN IN ED: Medications  lactated ringers bolus 1,000 mL (has no administration in time range)  potassium chloride SA (KLOR-CON M) CR tablet 40 mEq (has no administration in time range)  naloxone (NARCAN) nasal spray 4 mg/0.1 mL (1 spray Nasal Provided for home use 07/29/21 0254)  bacitracin ointment (1 application Topical Given 07/29/21 0552)  neomycin-bacitracin-polymyxin (NEOSPORIN) ointment packet (1 application Topical Given 07/29/21 0651)  lactated ringers bolus 500 mL (500 mLs Intravenous New Bag/Given 07/29/21 0728)     ED COURSE: After intranasal Narcan patient's mental status improved significantly, she is breathing normally, was able to take in out of the oxygen.  CT head and cervical spine no signs of traumatic injury.  X-rays also no signs of traumatic injury.  No signs of aspiration or pneumonia on chest x-ray.  I do believe that her hypoxia was just because of oversedation.  Skin tears were cleaned and sterilized, bacitracin applied and sterile dressing.  Hemoglobin stable.  VBG with no signs of hypercapnia.  Patient does have an elevated white count of 17.7.  UA is pending.  CK and BMP pending.  Alcohol level was negative.  UDS is pending.  We will continue to monitor closely on telemetry for any signs of respiratory depression.  Will give IV fluids for mild tachycardia most likely from blood loss and oversedation.  _________________________ 7:39 AM on 07/29/2021 ----------------------------------------- Chemistry panel showing mild AKI, will hydrate more aggressively  and mild hypokalemia for which she was supplemented p.o.  Patient remains back to her base mental status.  Plan to discharge home after fluids and hyperkalemia.  Admission was considered but felt unnecessary with full resolution of her mental status and stable vitals otherwise   Consults: none   EMR reviewed including discharge summary of patient's recent admission in November 2022 for fall with fractured ribs and COPD exacerbation          FINAL CLINICAL IMPRESSION(S) / ED DIAGNOSES   Final diagnoses:  Fall, initial encounter  Accidental overdose, initial encounter  Skin tear of right lower leg without complication, initial encounter  AKI (acute kidney injury) ([place])  Hypokalemia     Rx / DC Orders   ED Discharge Orders     None        Note:  This document was prepared using Dragon voice recognition software and may include unintentional dictation errors.    Alfred Levins, [place], MD 07/29/21 [phone]

## 2021-07-29 NOTE — ED Notes (Signed)
Cleansed right lower leg and right posterior forearm, bacitracin applied, and skin tears dressed with adaptic and secured with coban.

## 2021-07-29 NOTE — ED Triage Notes (Addendum)
Pt arrived via [facility] from home where husband found pt on bathroom floor this AM with approx 200 CC of blood on floor, from laceration to the left lower leg. Pt lethargic on arrival to ED with labored respirations. Per EMS, pt placed on 3L of oxygen due to oxygen sat in 80's. Pt chronic pain managed and was seen at pain clinic fore Percocet prescription on 1/09. Per pt, she took 3-4 Xanax and 1 Percocet in last 24 hours. Pt with slurred speech and lethargy during triage. Intranasal Narcan delivered on arrival with EMS dose. Pt talkative and reporting the need to have BM post delivery.    CBG 514

## 2021-08-18 DIAGNOSIS — I878 Other specified disorders of veins: Secondary | ICD-10-CM | POA: Insufficient documentation

## 2022-04-01 NOTE — Progress Notes (Deleted)
Referring Physician:  Townsend Roger, MD [address] [place],  [place] [address]  Primary Physician:  Townsend Roger, MD  History of Present Illness:  History of adrenal insufficiency, CHF, chronic pain, Cushing's disease, dementia, and COPD. On chronic prednisone for COPD.***  She smokes. History of slow healing wounds in bilateral legs.   04/01/2022 [name] was seen by Andee Poles in the hospital on 05/15/21 for chronic neck and bilateral arm pain with known severe cervical disease.       Duration: *** Location: *** Quality: *** Severity: ***  Precipitating: aggravated by *** Modifying factors: made better by *** Weakness: none Timing: *** Bowel/Bladder Dysfunction: none  Conservative measures:  Physical therapy: ***  Multimodal medical therapy including regular antiinflammatories: ***  Injections: *** epidural steroid injections  Past Surgery: ***  [name] has ***no symptoms of cervical myelopathy.  The symptoms are causing a significant impact on the patient's life.   Review of Systems:  A 10 point review of systems is negative, except for the pertinent positives and negatives detailed in the HPI.  Past Medical History: Past Medical History:  Diagnosis Date   Adrenal insufficiency (HCC)    Anxiety    CAD (coronary artery disease)    Cancer (HCC)    skin   Chronic diastolic CHF (congestive heart failure) (HCC)    Chronic pain    COPD (chronic obstructive pulmonary disease) (HCC)    Cushing disease (HCC)    Dementia (HCC)    GERD (gastroesophageal reflux disease)    HLD (hyperlipidemia)     Past Surgical History: Past Surgical History:  Procedure Laterality Date   APPENDECTOMY     HIP CLOSED REDUCTION Left 12/06/2019   Procedure: CLOSED MANIPULATION HIP;  Surgeon: Lovell Sheehan, MD;  Location: [facility] ORS;  Service: Orthopedics;  Laterality: Left;   NASAL SEPTUM SURGERY     NASAL SINUS SURGERY     UMBILICAL HERNIA REPAIR     VAGINAL  HYSTERECTOMY      Allergies: Allergies as of 04/02/2022 - Review Complete 07/29/2021  Allergen Reaction Noted   Bupropion Other (See Comments), Nausea Only, Shortness Of Breath, and Nausea And Vomiting 05/22/2014   Levaquin [levofloxacin in d5w] Anaphylaxis 03/20/2015   Methylprednisolone acetate Other (See Comments) 01/11/2019   Amoxicillin Itching 06/17/2015   Codeine Nausea And Vomiting 03/20/2015   Cymbalta [duloxetine hcl] Swelling 03/20/2015   Gatifloxacin Hives and Swelling 06/17/2015   Guaifenesin & derivatives Hives and Swelling 03/20/2015   Keflex [cephalexin] Other (See Comments) 03/20/2015   Klonopin [clonazepam] Other (See Comments) 03/20/2015   Macrobid [nitrofurantoin monohyd macro] Other (See Comments) 03/20/2015   Penicillins Hives 03/20/2015   Sulfa antibiotics Hives 03/20/2015   Amitriptyline Rash 06/17/2015   Other Rash 04/20/2018    Medications: Outpatient Encounter Medications as of 04/02/2022  Medication Sig   albuterol (VENTOLIN HFA) 108 (90 Base) MCG/ACT inhaler Inhale 2 puffs into the lungs every 6 (six) hours as needed.   ALPRAZolam (XANAX) 1 MG tablet Take 1 tablet (1 mg total) by mouth 4 (four) times daily as needed for anxiety (home med).   benzonatate (TESSALON) 200 MG capsule Take by mouth.   butalbital-acetaminophen-caffeine (FIORICET) 50-325-40 MG tablet Take 2 tablets by mouth daily as needed for headache.   Calcium Carbonate-Vitamin D 600-400 MG-UNIT tablet Take 1 tablet by mouth daily.   cyclobenzaprine (FLEXERIL) 10 MG tablet Take 10 mg by mouth 3 (three) times daily as needed.  donepezil (ARICEPT) 10 MG tablet Take 10 mg by mouth at bedtime.   EPINEPHrine 0.3 mg/0.3 mL IJ SOAJ injection Inject 0.3 mg into the muscle once.   fluticasone (FLONASE) 50 MCG/ACT nasal spray Place 2 sprays into the nose daily.   Fluticasone-Salmeterol (ADVAIR DISKUS) 250-50 MCG/DOSE AEPB Inhale 1 puff into the lungs every 12 (twelve) hours.   furosemide (LASIX) 80  MG tablet Take by mouth.   gabapentin (NEURONTIN) 600 MG tablet Take 1 tablet by mouth 3 (three) times daily.   hydrOXYzine (ATARAX/VISTARIL) 25 MG tablet Take 25 mg by mouth 3 (three) times daily as needed for itching.   magnesium oxide (MAG-OX) 400 MG tablet Take 400 mg by mouth daily.   meclizine (ANTIVERT) 25 MG tablet Take 1 tablet (25 mg total) by mouth 3 (three) times daily as needed for dizziness.   NARCAN 4 MG/0.1ML LIQD nasal spray kit SMARTSIG:Both Nares   omeprazole (PRILOSEC) 40 MG capsule Take 1 capsule by mouth 2 (two) times daily.   ondansetron (ZOFRAN-ODT) 4 MG disintegrating tablet Take 1 tablet (4 mg total) by mouth every 8 (eight) hours as needed for nausea or vomiting.   oxyCODONE-acetaminophen (PERCOCET) 10-325 MG tablet Take 1 tablet by mouth every 4 (four) hours as needed for pain (pt has to take mallinckrodt brand).    potassium chloride SA (K-DUR,KLOR-CON) 20 MEQ tablet Take 1 tablet (20 mEq total) by mouth 2 (two) times daily.   predniSONE (DELTASONE) 5 MG tablet Take 15 mg by mouth daily with breakfast.   PREMARIN vaginal cream Place vaginally. (Patient not taking: Reported on 05/24/2021)   promethazine (PHENERGAN) 25 MG tablet Take 1 tablet (25 mg total) by mouth every 8 (eight) hours as needed for nausea or vomiting.   traZODone (DESYREL) 100 MG tablet Take 100 mg by mouth at bedtime.   No facility-administered encounter medications on file as of 04/02/2022.    Social History: Social History   Tobacco Use   Smoking status: Every Day    Packs/day: 0.50    Types: Cigarettes   Smokeless tobacco: Never  Vaping Use   Vaping Use: Never used  Substance Use Topics   Alcohol use: Yes    Alcohol/week: 0.0 standard drinks of alcohol    Comment: rarely   Drug use: No    Family Medical History: Family History  Problem Relation Age of Onset   Ovarian cancer Mother    Breast cancer Mother    Brain cancer Mother    CAD Father    Diabetes Father    Hepatitis  Father    Liver disease Father    Cancer Father    Alcohol abuse Father    Heart attack Brother    Sleep apnea Brother    Diabetes Brother    ALS Paternal Grandmother    Stroke Maternal Grandmother     Physical Examination: There were no vitals filed for this visit.  General: Patient is well developed, well nourished, calm, collected, and in no apparent distress. Attention to examination is appropriate.  Respiratory: Patient is breathing without any difficulty.   NEUROLOGICAL:     Awake, alert, oriented to person, place, and time.  Speech is clear and fluent. Fund of knowledge is appropriate.   Cranial Nerves: Pupils equal round and reactive to light.  Facial tone is symmetric.  Facial sensation is symmetric.  ROM of spine:  *** ROM of cervical spine *** pain *** ROM of lumbar spine *** pain  No abnormal lesions on exposed  skin.   Strength: Side Biceps Triceps Deltoid Interossei Grip Wrist Ext. Wrist Flex.  R _0 L _1 Side Iliopsoas Quads Hamstring PF DF EHL  R _2 L _3 Reflexes are ***2+ and symmetric at the biceps, triceps, brachioradialis, patella and achilles.   Hoffman's is absent.  Clonus is not present.   Bilateral upper and lower extremity sensation is intact to light touch.    No evidence of dysmetria noted.  Gait is normal.   ***No difficulty with tandem gait.    Medical Decision Making  Imaging: CT cervical spine dated 07/29/21:  CT CERVICAL SPINE FINDINGS   Alignment: Anterolisthesis at C5-6, C6-7, and C7-T1, up to 8 mm at C6-7. Slip is related to severe facet osteoarthritis and is chronic.   Skull base and vertebrae: Chronic fragmentation at the C7 left superior articular process. No acute fracture or aggressive bone lesion.   Soft tissues and spinal canal: No prevertebral fluid or swelling. No visible canal hematoma.   Disc levels: Severe cervical facet degeneration with lower  cervical anterolisthesis noted above. C4-5 ACDF with solid arthrodesis   Upper chest: No emergent finding   IMPRESSION: No evidence of acute intracranial or cervical spine injury.     Electronically Signed   By: Jorje Guild M.D.   On: 07/29/2021 04:09  MRI cervical spine 10/15/20:  FINDINGS: Alignment: 7 mm grade 2 anterolisthesis C6 on C7. 4 mm grade 1 anterolisthesis C5 on C6 and C7 on T1.   Vertebrae: Prior C4-5 ACDF with solid osseous fusion. Degenerative marrow edema associated with the right C3-4 facet joint. No evidence of fracture, discitis, or bone lesion.   Cord: No intrinsic cord signal abnormality. Cord is mildly compressed at the C3-4 level.   Posterior Fossa, vertebral arteries, paraspinal tissues: Cerebral volume loss. Vertebral artery flow voids preserved. No acute findings.   Disc levels:   C2-C3: No disc protrusion. Moderate right greater than left facet arthropathy results in mild-to-moderate right foraminal stenosis. No canal stenosis.   C3-C4: Disc osteophyte complex, eccentric to the left with ligamentum flavum buckling and bilateral left worse than right facet and uncovertebral arthropathy. Small bilateral facet joint effusions. Findings contribute to severe canal stenosis with compression of the cervical cord. Severe left and mild-to-moderate right foraminal stenosis.   C4-C5: Prior ACDF without residual canal stenosis. Bilateral facet and uncovertebral arthropathy contribute to mild left foraminal stenosis.   C5-C6: Anterolisthesis with disc uncovering with bilateral facet and uncovertebral arthropathy. Mild canal stenosis with severe bilateral foraminal stenosis.   C6-C7: Anterolisthesis with disc uncovering with bilateral facet and uncovertebral arthropathy. Mild-to-moderate canal stenosis with severe bilateral foraminal stenosis.   C7-T1: Anterolisthesis with disc uncovering. Bilateral facet and uncovertebral arthropathy. Mild  canal stenosis with severe left and moderate right foraminal stenosis.   IMPRESSION: 1. Multilevel cervical spondylosis of the cervical spine greatest at the C3-4 level where there is severe canal stenosis and compression of the cervical cord. No intrinsic cord signal abnormality. 2. Mild-to-moderate canal stenosis with severe bilateral foraminal stenosis at C5-6 through C7-T1. 3. Severe left foraminal stenosis at C3-4. 4. Reactive marrow edema associated with the right C3-4 facet joint, which may be a focal source of pain. 5. Degenerative anterolisthesis of C5-6, C6-7, and C7-T1.   These results will be called to the ordering clinician or representative by the Radiologist Assistant,  and communication documented in the PACS or [employer].     Electronically Signed   By: Davina Poke D.O.   On: 10/15/2020 11:50   I have personally reviewed the images and agree with the above interpretation.   Assessment and Plan: [name] is a pleasant 61 y.o. female with ***  Above treatment options discussed with patient and following plan made:   - Order for physical therapy for *** spine ***. - Continue on current medications including ***. Reviewed proper dosing along with risks and benefits. Take and NSAIDs with food.      I spent a total of *** minutes in face-to-face and non-face-to-face activities related to this patient's care today.  Thank you for involving me in the care of this patient.   Geronimo Boot PA-C Dept. of Neurosurgery

## 2022-04-02 ENCOUNTER — Ambulatory Visit: Payer: Medicare Other | Admitting: Orthopedic Surgery

## 2022-04-02 NOTE — Progress Notes (Unsigned)
Referring Physician:  No referring provider defined for this encounter.  Primary Physician:  Townsend Roger, MD  History of Present Illness:  History of adrenal insufficiency, CHF, chronic pain, Cushing's disease, dementia, and COPD. On chronic prednisone for COPD.***  She smokes. History of slow healing wounds in bilateral legs.   04/02/2022 [name] was seen by Andee Poles in the hospital on 05/15/21 for chronic neck and bilateral arm pain with known severe cervical disease.       Duration: *** Location: *** Quality: *** Severity: ***  Precipitating: aggravated by *** Modifying factors: made better by *** Weakness: none Timing: *** Bowel/Bladder Dysfunction: none  Conservative measures:  Physical therapy: ***  Multimodal medical therapy including regular antiinflammatories: ***  Injections: *** epidural steroid injections  Past Surgery: ***  IDALI LAFEVER has ***no symptoms of cervical myelopathy.  The symptoms are causing a significant impact on the patient's life.   Review of Systems:  A 10 point review of systems is negative, except for the pertinent positives and negatives detailed in the HPI.  Past Medical History: Past Medical History:  Diagnosis Date   Adrenal insufficiency (HCC)    Anemia    Anxiety    CAD (coronary artery disease)    Cancer (HCC)    skin   Chronic diastolic CHF (congestive heart failure) (HCC)    Chronic pain    Chronic sinusitis    COPD (chronic obstructive pulmonary disease) (HCC)    Cushing disease (HCC)    Dementia (HCC)    GERD (gastroesophageal reflux disease)    HLD (hyperlipidemia)    Melanoma (HCC)    Obstructive chronic bronchitis with exacerbation ([place])    Secondary adrenal insufficiency (HCC)    Sleep apnea     Past Surgical History: Past Surgical History:  Procedure Laterality Date   ACDF     1984, 2003   APPENDECTOMY     COLONOSCOPY W/BIOPSY  07/27/2017   ESOPHAGOGASTRODOUDENOSCOPY W/BIOPSY  2015    EXCISION MALIGNANT LESION LEGS Left 2017   HIP CLOSED REDUCTION Left 12/06/2019   Procedure: CLOSED MANIPULATION HIP;  Surgeon: Lovell Sheehan, MD;  Location: [facility] ORS;  Service: Orthopedics;  Laterality: Left;   NASAL SEPTUM SURGERY     NASAL SINUS SURGERY     RADICAL TENOSYNOVECTOMY FLEXOR PALM/FINGER  05/15/2017   TOTAL HIP ARTHROPLASTY Left 35/59/7416   UMBILICAL HERNIA REPAIR     VAGINAL HYSTERECTOMY      Allergies: Allergies as of 04/07/2022 - Review Complete 07/29/2021  Allergen Reaction Noted   Bupropion Other (See Comments), Nausea Only, Shortness Of Breath, and Nausea And Vomiting 05/22/2014   Levaquin [levofloxacin in d5w] Anaphylaxis 03/20/2015   Methylprednisolone acetate Other (See Comments) 01/11/2019   Amoxicillin Itching 06/17/2015   Codeine Nausea And Vomiting 03/20/2015   Cymbalta [duloxetine hcl] Swelling 03/20/2015   Gatifloxacin Hives and Swelling 06/17/2015   Guaifenesin & derivatives Hives and Swelling 03/20/2015   Keflex [cephalexin] Other (See Comments) 03/20/2015   Klonopin [clonazepam] Other (See Comments) 03/20/2015   Macrobid [nitrofurantoin monohyd macro] Other (See Comments) 03/20/2015   Penicillins Hives 03/20/2015   Sulfa antibiotics Hives 03/20/2015   Amitriptyline Rash 06/17/2015   Other Rash 04/20/2018    Medications: Outpatient Encounter Medications as of 04/07/2022  Medication Sig   albuterol (VENTOLIN HFA) 108 (90 Base) MCG/ACT inhaler Inhale 2 puffs into the lungs every 6 (six) hours as needed.   ALPRAZolam (XANAX) 1 MG tablet Take 1 tablet (1 mg total) by mouth  4 (four) times daily as needed for anxiety (home med).   benzonatate (TESSALON) 200 MG capsule Take by mouth.   butalbital-acetaminophen-caffeine (FIORICET) 50-325-40 MG tablet Take 2 tablets by mouth daily as needed for headache.   Calcium Carbonate-Vitamin D 600-400 MG-UNIT tablet Take 1 tablet by mouth daily.   cyclobenzaprine (FLEXERIL) 10 MG tablet Take 10 mg by mouth 3  (three) times daily as needed.    donepezil (ARICEPT) 10 MG tablet Take 10 mg by mouth at bedtime.   EPINEPHrine 0.3 mg/0.3 mL IJ SOAJ injection Inject 0.3 mg into the muscle once.   fluticasone (FLONASE) 50 MCG/ACT nasal spray Place 2 sprays into the nose daily.   Fluticasone-Salmeterol (ADVAIR DISKUS) 250-50 MCG/DOSE AEPB Inhale 1 puff into the lungs every 12 (twelve) hours.   furosemide (LASIX) 80 MG tablet Take by mouth.   gabapentin (NEURONTIN) 600 MG tablet Take 1 tablet by mouth 3 (three) times daily.   hydrOXYzine (ATARAX/VISTARIL) 25 MG tablet Take 25 mg by mouth 3 (three) times daily as needed for itching.   magnesium oxide (MAG-OX) 400 MG tablet Take 400 mg by mouth daily.   meclizine (ANTIVERT) 25 MG tablet Take 1 tablet (25 mg total) by mouth 3 (three) times daily as needed for dizziness.   NARCAN 4 MG/0.1ML LIQD nasal spray kit SMARTSIG:Both Nares   omeprazole (PRILOSEC) 40 MG capsule Take 1 capsule by mouth 2 (two) times daily.   ondansetron (ZOFRAN-ODT) 4 MG disintegrating tablet Take 1 tablet (4 mg total) by mouth every 8 (eight) hours as needed for nausea or vomiting.   oxyCODONE-acetaminophen (PERCOCET) 10-325 MG tablet Take 1 tablet by mouth every 4 (four) hours as needed for pain (pt has to take mallinckrodt brand).    potassium chloride SA (K-DUR,KLOR-CON) 20 MEQ tablet Take 1 tablet (20 mEq total) by mouth 2 (two) times daily.   predniSONE (DELTASONE) 5 MG tablet Take 15 mg by mouth daily with breakfast.   PREMARIN vaginal cream Place vaginally. (Patient not taking: Reported on 05/24/2021)   promethazine (PHENERGAN) 25 MG tablet Take 1 tablet (25 mg total) by mouth every 8 (eight) hours as needed for nausea or vomiting.   traZODone (DESYREL) 100 MG tablet Take 100 mg by mouth at bedtime.   No facility-administered encounter medications on file as of 04/07/2022.    Social History: Social History   Tobacco Use   Smoking status: Every Day    Packs/day: 0.50    Types:  Cigarettes   Smokeless tobacco: Never  Vaping Use   Vaping Use: Never used  Substance Use Topics   Alcohol use: Yes    Alcohol/week: 0.0 standard drinks of alcohol    Comment: rarely   Drug use: No    Family Medical History: Family History  Problem Relation Age of Onset   Ovarian cancer Mother    Breast cancer Mother    Brain cancer Mother    CAD Father    Diabetes Father    Hepatitis Father    Liver disease Father    Cancer Father    Alcohol abuse Father    Heart attack Brother    Sleep apnea Brother    Diabetes Brother    ALS Paternal Grandmother    Stroke Maternal Grandmother     Physical Examination: There were no vitals filed for this visit.  General: Patient is well developed, well nourished, calm, collected, and in no apparent distress. Attention to examination is appropriate.  Respiratory: Patient is breathing without any difficulty.  NEUROLOGICAL:     Awake, alert, oriented to person, place, and time.  Speech is clear and fluent. Fund of knowledge is appropriate.   Cranial Nerves: Pupils equal round and reactive to light.  Facial tone is symmetric.  Facial sensation is symmetric.  ROM of spine:  *** ROM of cervical spine *** pain *** ROM of lumbar spine *** pain  No abnormal lesions on exposed skin.   Strength: Side Biceps Triceps Deltoid Interossei Grip Wrist Ext. Wrist Flex.  R 5 5 5 5 5 5 5   L 5 5 5 5 5 5 5    Side Iliopsoas Quads Hamstring PF DF EHL  R 5 5 5 5 5 5   L 5 5 5 5 5 5    Reflexes are ***2+ and symmetric at the biceps, triceps, brachioradialis, patella and achilles.   Hoffman's is absent.  Clonus is not present.   Bilateral upper and lower extremity sensation is intact to light touch.    No evidence of dysmetria noted.  Gait is normal.   ***No difficulty with tandem gait.    Medical Decision Making  Imaging: CT cervical spine dated 07/29/21:  CT CERVICAL SPINE FINDINGS   Alignment: Anterolisthesis at C5-6, C6-7, and C7-T1,  up to 8 mm at C6-7. Slip is related to severe facet osteoarthritis and is chronic.   Skull base and vertebrae: Chronic fragmentation at the C7 left superior articular process. No acute fracture or aggressive bone lesion.   Soft tissues and spinal canal: No prevertebral fluid or swelling. No visible canal hematoma.   Disc levels: Severe cervical facet degeneration with lower cervical anterolisthesis noted above. C4-5 ACDF with solid arthrodesis   Upper chest: No emergent finding   IMPRESSION: No evidence of acute intracranial or cervical spine injury.     Electronically Signed   By: Jorje Guild M.D.   On: 07/29/2021 04:09  MRI cervical spine 10/15/20:  FINDINGS: Alignment: 7 mm grade 2 anterolisthesis C6 on C7. 4 mm grade 1 anterolisthesis C5 on C6 and C7 on T1.   Vertebrae: Prior C4-5 ACDF with solid osseous fusion. Degenerative marrow edema associated with the right C3-4 facet joint. No evidence of fracture, discitis, or bone lesion.   Cord: No intrinsic cord signal abnormality. Cord is mildly compressed at the C3-4 level.   Posterior Fossa, vertebral arteries, paraspinal tissues: Cerebral volume loss. Vertebral artery flow voids preserved. No acute findings.   Disc levels:   C2-C3: No disc protrusion. Moderate right greater than left facet arthropathy results in mild-to-moderate right foraminal stenosis. No canal stenosis.   C3-C4: Disc osteophyte complex, eccentric to the left with ligamentum flavum buckling and bilateral left worse than right facet and uncovertebral arthropathy. Small bilateral facet joint effusions. Findings contribute to severe canal stenosis with compression of the cervical cord. Severe left and mild-to-moderate right foraminal stenosis.   C4-C5: Prior ACDF without residual canal stenosis. Bilateral facet and uncovertebral arthropathy contribute to mild left foraminal stenosis.   C5-C6: Anterolisthesis with disc uncovering with  bilateral facet and uncovertebral arthropathy. Mild canal stenosis with severe bilateral foraminal stenosis.   C6-C7: Anterolisthesis with disc uncovering with bilateral facet and uncovertebral arthropathy. Mild-to-moderate canal stenosis with severe bilateral foraminal stenosis.   C7-T1: Anterolisthesis with disc uncovering. Bilateral facet and uncovertebral arthropathy. Mild canal stenosis with severe left and moderate right foraminal stenosis.   IMPRESSION: 1. Multilevel cervical spondylosis of the cervical spine greatest at the C3-4 level where there is severe canal stenosis and compression of the cervical  cord. No intrinsic cord signal abnormality. 2. Mild-to-moderate canal stenosis with severe bilateral foraminal stenosis at C5-6 through C7-T1. 3. Severe left foraminal stenosis at C3-4. 4. Reactive marrow edema associated with the right C3-4 facet joint, which may be a focal source of pain. 5. Degenerative anterolisthesis of C5-6, C6-7, and C7-T1.   These results will be called to the ordering clinician or representative by the Radiologist Assistant, and communication documented in the PACS or [employer].     Electronically Signed   By: Davina Poke D.O.   On: 10/15/2020 11:50   I have personally reviewed the images and agree with the above interpretation.   Assessment and Plan: Ms. Lohnes is a pleasant 61 y.o. female with ***  Above treatment options discussed with patient and following plan made:   - Order for physical therapy for *** spine ***. - Continue on current medications including ***. Reviewed proper dosing along with risks and benefits. Take and NSAIDs with food.      I spent a total of *** minutes in face-to-face and non-face-to-face activities related to this patient's care today.  Thank you for involving me in the care of this patient.   Geronimo Boot PA-C Dept. of Neurosurgery

## 2022-04-07 ENCOUNTER — Ambulatory Visit: Payer: Medicare Other | Admitting: Orthopedic Surgery

## 2022-04-07 ENCOUNTER — Encounter: Payer: Self-pay | Admitting: Orthopedic Surgery

## 2022-04-07 VITALS — BP 132/70 | Ht 63.0 in | Wt 149.0 lb

## 2022-04-07 DIAGNOSIS — M4312 Spondylolisthesis, cervical region: Secondary | ICD-10-CM | POA: Diagnosis not present

## 2022-04-07 DIAGNOSIS — Z981 Arthrodesis status: Secondary | ICD-10-CM

## 2022-04-07 DIAGNOSIS — G959 Disease of spinal cord, unspecified: Secondary | ICD-10-CM

## 2022-04-07 DIAGNOSIS — M4802 Spinal stenosis, cervical region: Secondary | ICD-10-CM | POA: Diagnosis not present

## 2022-04-07 MED ORDER — CERVICAL COLLAR MISC: 0 refills | Status: DC

## 2022-04-07 NOTE — Patient Instructions (Signed)
It was so nice to see you today, I am sorry that you are hurting so much.   Your neck imaging shows that your previous fusion is healed. You have wear and tear in your neck with some pressure on the spinal cord (stenosis).   I want to get an MRI of your neck along with repeat xrays to look into things further. We will call you to set this up.   You will see Dr. Izora Ribas back after your imaging to discuss any possible surgery options.   Please do not hesitate to call if you have any questions or concerns. You can also message me in [place].   If you have not heard back about any of the tests/procedures in the next week, please call the office so we can help you get these things scheduled.   Geronimo Boot PA-C [phone]

## 2022-04-08 NOTE — Addendum Note (Signed)
Addended by: Herb Grays on: 04/08/2022 09:10 AM   Modules accepted: Orders

## 2022-04-10 ENCOUNTER — Telehealth: Payer: Self-pay

## 2022-04-10 NOTE — Telephone Encounter (Signed)
-----   Message from Geronimo Boot, [place] sent at 04/10/2022  3:36 PM EDT ----- I do not recommend MRI with and without contrast as there is no clear indication for this. If radiology sees anything concerning on the MRI without contrast, then we can order an MRI with contrast.   Thanks.  ----- Message ----- From: Rae Lips, CMA Sent: 04/10/2022  11:18 AM EDT To: Geronimo Boot, PA-C  Patient is asking for her MRI to be with and without contrast..is this appropriate?  ----- Message ----- From: Berdine Addison, RN Sent: 04/10/2022  11:00 AM EDT To: Rae Lips, [place]; Berdine Addison, RN  Per Carol-Lyn, we need to call her b/c she has questions about her MRI

## 2022-04-10 NOTE — Telephone Encounter (Signed)
I called the number in her chart 3 times and each time it was a busy signal. I will try again later.

## 2022-04-13 NOTE — Telephone Encounter (Signed)
I don't think this would be from her cervical spine. She should follow up with PCP. If she has any chest pain then she should go to ED.

## 2022-04-13 NOTE — Telephone Encounter (Signed)
I spoke with the patient and she wanted me to let you know that she started having some numbness in her chest yesterday.

## 2022-04-14 NOTE — Telephone Encounter (Signed)
No answer

## 2022-04-15 NOTE — Telephone Encounter (Signed)
Patient did answer his phone and she is still having chest numbness she is aware that Marzetta Board recommends her to see her PCP.

## 2022-04-21 ENCOUNTER — Inpatient Hospital Stay: Admission: RE | Admit: 2022-04-21 | Payer: Medicare Other | Source: Ambulatory Visit

## 2022-04-21 ENCOUNTER — Ambulatory Visit: Payer: Medicare Other

## 2022-04-23 ENCOUNTER — Ambulatory Visit: Admission: RE | Admit: 2022-04-23 | Payer: Medicare Other | Source: Ambulatory Visit

## 2022-04-23 ENCOUNTER — Other Ambulatory Visit: Payer: Medicare Other

## 2022-04-27 ENCOUNTER — Ambulatory Visit
Admission: RE | Admit: 2022-04-27 | Discharge: 2022-04-27 | Disposition: A | Discharge status: Home / Self Care | Payer: Medicare Other | Source: Ambulatory Visit | Attending: Orthopedic Surgery | Admitting: Orthopedic Surgery

## 2022-04-27 DIAGNOSIS — G959 Disease of spinal cord, unspecified: Secondary | ICD-10-CM

## 2022-04-27 DIAGNOSIS — Z981 Arthrodesis status: Secondary | ICD-10-CM

## 2022-04-27 DIAGNOSIS — M4802 Spinal stenosis, cervical region: Secondary | ICD-10-CM

## 2022-04-27 DIAGNOSIS — M4312 Spondylolisthesis, cervical region: Secondary | ICD-10-CM | POA: Diagnosis present

## 2022-04-27 NOTE — Progress Notes (Unsigned)
Referring Physician:  Townsend Roger, MD [address] [place],  [place] [id number]  Primary Physician:  Townsend Roger, MD  History of Present Illness: 04/27/2022 [name] is here today with a chief complaint of neck pain that radiates into the bilateral arms with numbness, tingling and weakness in the arms with the right side worse. She has also been dropping things. Increase in falls.  He has been having symptoms for over 20 years.  She feels like her symptoms might be somewhat worse.  She does have pain that is made worse by movement of her neck.  Conservative measures:  Physical therapy: has not participated in Multimodal medical therapy including regular antiinflammatories:  flexeril, gabapentin, oxycodone Injections:   has not had any epidural steroid injections for her neck   Past Surgery:  ACDF in her 48s by Dr. Glenna Fellows Revision C45 ACDF by Dr. Glenna Fellows  [name] has some symptoms of cervical myelopathy.  The symptoms are causing a significant impact on the patient's life.   Progress Note frem Geronimo Boot, PA on 04/07/22:  History of Present Illness:   History of adrenal insufficiency, CHF, chronic pain, Cushing's disease, dementia, and COPD. On chronic prednisone for COPD.   She smokes. History of slow healing wounds in bilateral legs.    04/07/2022 [name] was seen by Andee Poles in the hospital on 05/15/21 for chronic neck and bilateral arm pain with known severe cervical disease.    History of chronic constant neck pain with bilateral arm pain, left = right, that is worse with any movement of her neck. She has numbness, tingling, and weakness in both arms. She has difficulty holding objects and frequently drops them.   She notes increase in balance and more frequent falls in last 6 months.    She's had difficulty swallowing and hoarse voice since her second cervical surgery years ago. She feels like this is getting worse.  Review of Systems:  A  10 point review of systems is negative, except for the pertinent positives and negatives detailed in the HPI.  Past Medical History: Past Medical History:  Diagnosis Date   Adrenal insufficiency (HCC)    Anemia    Anxiety    CAD (coronary artery disease)    Cancer (HCC)    skin   Chronic diastolic CHF (congestive heart failure) (HCC)    Chronic pain    Chronic sinusitis    COPD (chronic obstructive pulmonary disease) (HCC)    Cushing disease (HCC)    Dementia (HCC)    GERD (gastroesophageal reflux disease)    HLD (hyperlipidemia)    Melanoma (HCC)    Obstructive chronic bronchitis with exacerbation ([place])    Secondary adrenal insufficiency (HCC)    Sleep apnea     Past Surgical History: Past Surgical History:  Procedure Laterality Date   ACDF     1984, 2003   APPENDECTOMY     COLONOSCOPY W/BIOPSY  07/27/2017   ESOPHAGOGASTRODOUDENOSCOPY W/BIOPSY  2015   EXCISION MALIGNANT LESION LEGS Left 2017   HIP CLOSED REDUCTION Left 12/06/2019   Procedure: CLOSED MANIPULATION HIP;  Surgeon: Lovell Sheehan, MD;  Location: [facility] ORS;  Service: Orthopedics;  Laterality: Left;   NASAL SEPTUM SURGERY     NASAL SINUS SURGERY     RADICAL TENOSYNOVECTOMY FLEXOR PALM/FINGER  05/15/2017   TOTAL HIP ARTHROPLASTY Left 08/65/7846   UMBILICAL HERNIA REPAIR     VAGINAL HYSTERECTOMY  Allergies: Allergies as of 04/28/2022 - Review Complete 04/08/2022  Allergen Reaction Noted   Bupropion Other (See Comments), Nausea Only, Shortness Of Breath, and Nausea And Vomiting 05/22/2014   Levaquin [levofloxacin in d5w] Anaphylaxis 03/20/2015   Methylprednisolone acetate Other (See Comments) 01/11/2019   Amoxicillin Itching 06/17/2015   Codeine Nausea And Vomiting 03/20/2015   Cymbalta [duloxetine hcl] Swelling 03/20/2015   Gatifloxacin Hives and Swelling 06/17/2015   Guaifenesin & derivatives Hives and Swelling 03/20/2015   Haemophilus influenzae vaccines  04/08/2022   Keflex [cephalexin] Other  (See Comments) 03/20/2015   Klonopin [clonazepam] Other (See Comments) 03/20/2015   Macrobid [nitrofurantoin monohyd macro] Other (See Comments) 03/20/2015   Oxycontin [oxycodone]  04/08/2022   Penicillins Hives 03/20/2015   Sulfa antibiotics Hives 03/20/2015   Amitriptyline Rash 06/17/2015   Morphine Itching and Rash 04/08/2022   Other Rash 04/20/2018    Medications: No outpatient medications have been marked as taking for the 04/28/22 encounter (Appointment) with Meade Maw, MD.    Social History: Social History   Tobacco Use   Smoking status: Every Day    Packs/day: 0.50    Types: Cigarettes   Smokeless tobacco: Never  Vaping Use   Vaping Use: Never used  Substance Use Topics   Alcohol use: Yes    Alcohol/week: 0.0 standard drinks of alcohol    Comment: rarely   Drug use: No    Family Medical History: Family History  Problem Relation Age of Onset   Ovarian cancer Mother    Breast cancer Mother    Brain cancer Mother    CAD Father    Diabetes Father    Hepatitis Father    Liver disease Father    Cancer Father    Alcohol abuse Father    Heart attack Brother    Sleep apnea Brother    Diabetes Brother    ALS Paternal Grandmother    Stroke Maternal Grandmother     Physical Examination: There were no vitals filed for this visit.  General: Patient is well developed, well nourished, calm, collected, and in no apparent distress. Attention to examination is appropriate.  Neck:   Supple.  Full range of motion.  Respiratory: Patient is breathing without any difficulty.   NEUROLOGICAL:     Awake, alert, oriented to person, place, and time.  Speech is clear and fluent. Fund of knowledge is appropriate.   Cranial Nerves: Pupils equal round and reactive to light.  Facial tone is symmetric.  Facial sensation is symmetric. Shoulder shrug is symmetric. Tongue protrusion is midline.  There is no pronator drift.  ROM of spine: full.    Strength: Side Biceps  Triceps Deltoid Interossei Grip Wrist Ext. Wrist Flex.  R '5 5 5 5 5 5 5  '$ L '5 5 5 5 5 5 5   '$ Side Iliopsoas Quads Hamstring PF DF EHL  R '5 5 5 5 5 5  '$ L '5 5 5 5 5 5   '$ Reflexes are 1+ and symmetric at the biceps, triceps, brachioradialis, patella and achilles.   Hoffman's is absent.   Bilateral upper and lower extremity sensation is intact to light touch.    No evidence of dysmetria noted.  Gait is normal.     Medical Decision Making  Imaging: MRI C spine 04/27/2022 C3-4: Degenerative facet spurring with ligamentum flavum thickening. Uncovertebral ridging asymmetric to the left where there is moderate foraminal stenosis. Bulging disc protrusion and ligamentum flavum thickening flattens the cord on sagittal images. A small ligamentum  flavum ganglion is seen right paracentral, 3 mm in maximal diameter   C4-5: ACDF with solid arthrodesis and no impingement   C5-6: Advanced facet osteoarthritis with spurring and anterolisthesis. Mild disc bulging. Biforaminal impingement   C6-7: Advanced facet osteoarthritis with spurring and anterolisthesis. Disc collapse and endplate degeneration. Biforaminal impingement   C7-T1:Advanced facet osteoarthritis with spurring and anterolisthesis. Central disc protrusion indenting the ventral cord. Biforaminal impingement.   IMPRESSION: 1. Advanced cervical spine degeneration with multilevel anterolisthesis measuring up to 7 mm at C6-7. 2. Spinal stenosis with cord indentation at C3-4 and C7-T1. 3. Advanced bilateral foraminal impingement at C5-6 to C7-T1. Moderate foraminal impingement on the left at C3-4. 4. C4-5 ACDF with solid fusion.     Electronically Signed   By: Jorje Guild M.D.   On: 04/28/2022 08:41  I have personally reviewed the images and agree with the above interpretation.  Assessment and Plan: Ms. Loscalzo is a pleasant 61 y.o. 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  She has significant anterolisthesis at C6-7 and  C7-T1.  She has some foraminal compression due to this.  She is on chronic long-term steroids.  She has osteoporosis and is a smoker.  At this point, I do not think she is an appropriate candidate for an extensive cervicothoracic decompression and fusion.  We will monitor her symptoms for now.  I will see her back in 3 months.  I spent a total of 30 minutes in face-to-face and non-face-to-face activities related to this patient's care today.  Thank you for involving me in the care of this patient.      Ramonia Mcclaran K. Izora Ribas MD, [facility] Neurosurgery

## 2022-04-28 ENCOUNTER — Ambulatory Visit (INDEPENDENT_AMBULATORY_CARE_PROVIDER_SITE_OTHER): Payer: Medicare Other | Admitting: Neurosurgery

## 2022-04-28 ENCOUNTER — Telehealth: Payer: Self-pay

## 2022-04-28 ENCOUNTER — Encounter: Payer: Self-pay | Admitting: Neurosurgery

## 2022-04-28 VITALS — BP 130/82 | Ht 63.0 in | Wt 149.0 lb

## 2022-04-28 DIAGNOSIS — M4802 Spinal stenosis, cervical region: Secondary | ICD-10-CM

## 2022-04-28 DIAGNOSIS — G959 Disease of spinal cord, unspecified: Secondary | ICD-10-CM | POA: Diagnosis not present

## 2022-04-28 NOTE — Telephone Encounter (Signed)
[name] inquired about a neck brace or soft collar before leaving our office today. I have discussed this with Dr Izora Ribas. Dr Izora Ribas said he would not recommend a hard collar, just a soft collar for comfort. I have notified Kristina Criss of Dr Rhea Bleacher recommendation and informed her that she may be able to find this at [facility], [employer], a medical supply store, or [employer].

## 2022-05-28 ENCOUNTER — Other Ambulatory Visit: Payer: Self-pay | Admitting: Student

## 2022-05-29 LAB — COMPREHENSIVE METABOLIC PANEL
ALT: 12 IU/L (ref 0–32)
AST: 17 IU/L (ref 0–40)
Albumin/Globulin Ratio: 1.7 (ref 1.2–2.2)
Albumin: 4.5 g/dL (ref 3.8–4.9)
Alkaline Phosphatase: 105 IU/L (ref 44–121)
BUN/Creatinine Ratio: 14 (ref 12–28)
BUN: 15 mg/dL (ref 8–27)
Bilirubin Total: <0.2 mg/dL (ref 0.0–1.2)
CO2: 28 mmol/L (ref 20–29)
Calcium: 9.5 mg/dL (ref 8.7–10.3)
Chloride: 92 mmol/L — ABNORMAL LOW (ref 96–106)
Creatinine, Ser: 1.06 mg/dL — ABNORMAL HIGH (ref 0.57–1.00)
Globulin, Total: 2.6 g/dL (ref 1.5–4.5)
Glucose: 218 mg/dL — ABNORMAL HIGH (ref 70–99)
Potassium: 5 mmol/L (ref 3.5–5.2)
Sodium: 138 mmol/L (ref 134–144)
Total Protein: 7.1 g/dL (ref 6.0–8.5)
eGFR: 60 mL/min/{1.73_m2} (ref >59)

## 2022-05-29 LAB — CBC WITH DIFFERENTIAL/PLATELET
Basophils Absolute: 0.1 10*3/uL (ref 0.0–0.2)
Basos: 0 %
EOS (ABSOLUTE): 0 10*3/uL (ref 0.0–0.4)
Eos: 0 %
Hematocrit: 34.1 % (ref 34.0–46.6)
Hemoglobin: 9.2 g/dL — ABNORMAL LOW (ref 11.1–15.9)
Immature Grans (Abs): 0.1 10*3/uL (ref 0.0–0.1)
Immature Granulocytes: 1 %
Lymphocytes Absolute: 1.4 10*3/uL (ref 0.7–3.1)
Lymphs: 9 %
MCH: 19.1 pg — ABNORMAL LOW (ref 26.6–33.0)
MCHC: 27 g/dL — ABNORMAL LOW (ref 31.5–35.7)
MCV: 71 fL — ABNORMAL LOW (ref 79–97)
Monocytes Absolute: 0.4 10*3/uL (ref 0.1–0.9)
Monocytes: 2 %
Neutrophils Absolute: 14.3 10*3/uL — ABNORMAL HIGH (ref 1.4–7.0)
Neutrophils: 88 %
Platelets: 496 10*3/uL — ABNORMAL HIGH (ref 150–450)
RBC: 4.81 x10E6/uL (ref 3.77–5.28)
RDW: 19.4 % — ABNORMAL HIGH (ref 11.7–15.4)
WBC: 16.2 10*3/uL — ABNORMAL HIGH (ref 3.4–10.8)

## 2022-05-29 LAB — VITAMIN B12: Vitamin B-12: 369 pg/mL (ref 232–1245)

## 2022-05-29 LAB — CORTISOL: Cortisol: 1.9 ug/dL — ABNORMAL LOW (ref 6.2–19.4)

## 2022-05-29 LAB — HGB A1C W/O EAG: Hgb A1c MFr Bld: 8 % — ABNORMAL HIGH (ref 4.8–5.6)

## 2022-07-21 ENCOUNTER — Other Ambulatory Visit: Payer: Self-pay

## 2022-07-21 MED ORDER — FUROSEMIDE 80 MG PO TABS: 80.0000 mg | ORAL_TABLET | Freq: Three times a day (TID) | ORAL | 3 refills | Status: DC

## 2022-07-24 ENCOUNTER — Ambulatory Visit: Payer: Medicare Other | Admitting: Internal Medicine

## 2022-07-30 ENCOUNTER — Ambulatory Visit: Payer: Medicare Other | Admitting: Neurosurgery

## 2022-08-11 ENCOUNTER — Ambulatory Visit: Payer: Medicare Other | Admitting: Internal Medicine

## 2022-08-13 ENCOUNTER — Ambulatory Visit: Payer: Medicare Other | Admitting: Neurosurgery

## 2022-08-20 ENCOUNTER — Ambulatory Visit: Payer: Medicare Other | Admitting: Neurosurgery

## 2022-09-01 ENCOUNTER — Telehealth: Payer: Self-pay

## 2022-09-01 ENCOUNTER — Ambulatory Visit (INDEPENDENT_AMBULATORY_CARE_PROVIDER_SITE_OTHER): Payer: Medicare Other | Admitting: Neurosurgery

## 2022-09-01 ENCOUNTER — Encounter: Payer: Self-pay | Admitting: Neurosurgery

## 2022-09-01 VITALS — BP 130/72 | Ht 63.0 in | Wt 146.2 lb

## 2022-09-01 DIAGNOSIS — M4802 Spinal stenosis, cervical region: Secondary | ICD-10-CM | POA: Diagnosis not present

## 2022-09-01 DIAGNOSIS — M4312 Spondylolisthesis, cervical region: Secondary | ICD-10-CM

## 2022-09-01 DIAGNOSIS — G959 Disease of spinal cord, unspecified: Secondary | ICD-10-CM

## 2022-09-01 DIAGNOSIS — G629 Polyneuropathy, unspecified: Secondary | ICD-10-CM

## 2022-09-01 NOTE — Telephone Encounter (Signed)
EMG order has been placed on this patient. Please send this to Neurology.

## 2022-09-01 NOTE — Progress Notes (Signed)
Referring Physician:  No referring provider defined for this encounter.  Primary Physician:  Townsend Roger, MD  History of Present Illness: 09/01/2022 [name] returns to see me.  She still having pain in her neck and numbness in her arms.  She continues to have balance issues.  She is smoking 1/2 pack/day.  She is having issues with her sugar control.  04/28/2022 [name] is here today with a chief complaint of neck pain that radiates into the bilateral arms with numbness, tingling and weakness in the arms with the right side worse. She has also been dropping things. Increase in falls.  He has been having symptoms for over 20 years.  She feels like her symptoms might be somewhat worse.  She does have pain that is made worse by movement of her neck.  Conservative measures:  Physical therapy: has not participated in Multimodal medical therapy including regular antiinflammatories:  flexeril, gabapentin, oxycodone Injections:   has not had any epidural steroid injections for her neck   Past Surgery:  ACDF in her 55s by Dr. Glenna Fellows Revision C45 ACDF by Dr. Glenna Fellows  [name] has some symptoms of cervical myelopathy.  The symptoms are causing a significant impact on the patient's life.   Progress Note frem Geronimo Boot, PA on 04/07/22:  History of Present Illness:   History of adrenal insufficiency, CHF, chronic pain, Cushing's disease, dementia, and COPD. On chronic prednisone for COPD.   She smokes. History of slow healing wounds in bilateral legs.    04/07/2022 [name] was seen by Andee Poles in the hospital on 05/15/21 for chronic neck and bilateral arm pain with known severe cervical disease.    History of chronic constant neck pain with bilateral arm pain, left = right, that is worse with any movement of her neck. She has numbness, tingling, and weakness in both arms. She has difficulty holding objects and frequently drops them.   She notes increase in  balance and more frequent falls in last 6 months.    She's had difficulty swallowing and hoarse voice since her second cervical surgery years ago. She feels like this is getting worse.  Review of Systems:  A 10 point review of systems is negative, except for the pertinent positives and negatives detailed in the HPI.  Past Medical History: Past Medical History:  Diagnosis Date   Adrenal insufficiency (HCC)    Anemia    Anxiety    CAD (coronary artery disease)    Cancer (HCC)    skin   Chronic diastolic CHF (congestive heart failure) (HCC)    Chronic pain    Chronic sinusitis    COPD (chronic obstructive pulmonary disease) (HCC)    Cushing disease (HCC)    Dementia (HCC)    GERD (gastroesophageal reflux disease)    HLD (hyperlipidemia)    Melanoma (HCC)    Obstructive chronic bronchitis with exacerbation ([place])    Secondary adrenal insufficiency (HCC)    Sleep apnea     Past Surgical History: Past Surgical History:  Procedure Laterality Date   ACDF     1984, 2003   APPENDECTOMY     COLONOSCOPY W/BIOPSY  07/27/2017   ESOPHAGOGASTRODOUDENOSCOPY W/BIOPSY  2015   EXCISION MALIGNANT LESION LEGS Left 2017   HIP CLOSED REDUCTION Left 12/06/2019   Procedure: CLOSED MANIPULATION HIP;  Surgeon: Lovell Sheehan, MD;  Location: [facility] ORS;  Service: Orthopedics;  Laterality: Left;   NASAL SEPTUM SURGERY  NASAL SINUS SURGERY     RADICAL TENOSYNOVECTOMY FLEXOR PALM/FINGER  05/15/2017   TOTAL HIP ARTHROPLASTY Left 0000000   UMBILICAL HERNIA REPAIR     VAGINAL HYSTERECTOMY      Allergies: Allergies as of 09/01/2022 - Review Complete 09/01/2022  Allergen Reaction Noted   Bupropion Other (See Comments), Nausea Only, Shortness Of Breath, and Nausea And Vomiting 05/22/2014   Levaquin [levofloxacin in d5w] Anaphylaxis 03/20/2015   Methylprednisolone acetate Other (See Comments) 01/11/2019   Amoxicillin Itching 06/17/2015   Codeine Nausea And Vomiting 03/20/2015   Cymbalta  [duloxetine hcl] Swelling 03/20/2015   Gatifloxacin Hives and Swelling 06/17/2015   Guaifenesin & derivatives Hives and Swelling 03/20/2015   Haemophilus influenzae vaccines  04/08/2022   Keflex [cephalexin] Other (See Comments) 03/20/2015   Klonopin [clonazepam] Other (See Comments) 03/20/2015   Macrobid [nitrofurantoin monohyd macro] Other (See Comments) 03/20/2015   Oxycontin [oxycodone]  04/08/2022   Penicillins Hives 03/20/2015   Sulfa antibiotics Hives 03/20/2015   Amitriptyline Rash 06/17/2015   Morphine Itching and Rash 04/08/2022   Other Rash 04/20/2018    Medications: Current Meds  Medication Sig   ACCU-CHEK GUIDE test strip    albuterol (VENTOLIN HFA) 108 (90 Base) MCG/ACT inhaler Inhale 2 puffs into the lungs every 6 (six) hours as needed.   ALPRAZolam (XANAX) 1 MG tablet Take 1 tablet (1 mg total) by mouth 4 (four) times daily as needed for anxiety (home med).   buPROPion ER (WELLBUTRIN SR) 100 MG 12 hr tablet Take 100 mg by mouth 2 (two) times daily.   CALCIUM CARBONATE-VITAMIN D PO Take 1 tablet by mouth daily.   cyclobenzaprine (FLEXERIL) 10 MG tablet Take 10 mg by mouth 3 (three) times daily as needed.    diazepam (VALIUM) 10 MG tablet For dental procedure   donepezil (ARICEPT) 10 MG tablet Take 10 mg by mouth at bedtime.   doxepin (SINEQUAN) 50 MG capsule Take 50 mg by mouth at bedtime.   EPINEPHrine 0.3 mg/0.3 mL IJ SOAJ injection Inject 0.3 mg into the muscle once.   fluticasone (FLONASE) 50 MCG/ACT nasal spray Place 2 sprays into the nose daily.   furosemide (LASIX) 80 MG tablet Take 1 tablet (80 mg total) by mouth 3 (three) times daily.   gabapentin (NEURONTIN) 600 MG tablet Take 1 tablet by mouth 3 (three) times daily.   glipiZIDE (GLUCOTROL) 10 MG tablet Take 10 mg by mouth daily before breakfast.   hydrOXYzine (ATARAX/VISTARIL) 25 MG tablet Take 25 mg by mouth 3 (three) times daily as needed for itching.   Insulin Glargine w/ Trans Port 100 UNIT/ML SOPN  Inject into the skin.   Insulin Pen Needle (PEN NEEDLES 31GX5/16") 31G X 8 MM MISC See admin instructions.   magnesium oxide (MAG-OX) 400 MG tablet Take 400 mg by mouth daily.   meclizine (ANTIVERT) 25 MG tablet Take 1 tablet (25 mg total) by mouth 3 (three) times daily as needed for dizziness.   NARCAN 4 MG/0.1ML LIQD nasal spray kit SMARTSIG:Both Nares   oxyCODONE-acetaminophen (PERCOCET) 10-325 MG tablet Take 1 tablet by mouth every 4 (four) hours as needed for pain (pt has to take [place] brand).    potassium chloride SA (K-DUR,KLOR-CON) 20 MEQ tablet Take 1 tablet (20 mEq total) by mouth 2 (two) times daily.   predniSONE (DELTASONE) 5 MG tablet Take 20 mg by mouth daily with breakfast.   PREMARIN vaginal cream Place vaginally.   promethazine (PHENERGAN) 25 MG tablet Take 1 tablet (25 mg total) by  mouth every 8 (eight) hours as needed for nausea or vomiting.   traZODone (DESYREL) 100 MG tablet Take 100 mg by mouth at bedtime.   triamcinolone ointment (KENALOG) 0.1 % Apply topically.    Social History: Social History   Tobacco Use   Smoking status: Every Day    Packs/day: 0.50    Types: Cigarettes   Smokeless tobacco: Never   Tobacco comments:    1/2 ppd  Vaping Use   Vaping Use: Never used  Substance Use Topics   Alcohol use: Yes    Alcohol/week: 0.0 standard drinks of alcohol    Comment: rarely   Drug use: No    Family Medical History: Family History  Problem Relation Age of Onset   Ovarian cancer Mother    Breast cancer Mother    Brain cancer Mother    CAD Father    Diabetes Father    Hepatitis Father    Liver disease Father    Cancer Father    Alcohol abuse Father    Heart attack Brother    Sleep apnea Brother    Diabetes Brother    ALS Paternal Grandmother    Stroke Maternal Grandmother     Physical Examination: Vitals:   09/01/22 1109  BP: 130/72    General: Patient is well developed, well nourished, calm, collected, and in no apparent distress.  Attention to examination is appropriate.  Neck:   Supple.  Full range of motion.  Respiratory: Patient is breathing without any difficulty.   NEUROLOGICAL:     Awake, alert, oriented to person, place, and time.  Speech is clear and fluent. Fund of knowledge is appropriate.   Cranial Nerves: Pupils equal round and reactive to light.  Facial tone is symmetric.  Facial sensation is symmetric. Shoulder shrug is symmetric. Tongue protrusion is midline.  There is no pronator drift.  ROM of spine: full.    Strength: Side Biceps Triceps Deltoid Interossei Grip Wrist Ext. Wrist Flex.  R 5 5 5 5 5 5 5  $ L 5 5 5 5 5 5 5   $ Side Iliopsoas Quads Hamstring PF DF EHL  R 5 5 5 5 5 5  $ L 5 5 5 5 5 5   $ Reflexes are 1+ and symmetric at the biceps, triceps, brachioradialis, patella and achilles.   Hoffman's is absent.   Bilateral upper and lower extremity sensation is intact to light touch.    No evidence of dysmetria noted.  Gait is normal.     Medical Decision Making  Imaging: MRI C spine 04/27/2022 C3-4: Degenerative facet spurring with ligamentum flavum thickening. Uncovertebral ridging asymmetric to the left where there is moderate foraminal stenosis. Bulging disc protrusion and ligamentum flavum thickening flattens the cord on sagittal images. A small ligamentum flavum ganglion is seen right paracentral, 3 mm in maximal diameter   C4-5: ACDF with solid arthrodesis and no impingement   C5-6: Advanced facet osteoarthritis with spurring and anterolisthesis. Mild disc bulging. Biforaminal impingement   C6-7: Advanced facet osteoarthritis with spurring and anterolisthesis. Disc collapse and endplate degeneration. Biforaminal impingement   C7-T1:Advanced facet osteoarthritis with spurring and anterolisthesis. Central disc protrusion indenting the ventral cord. Biforaminal impingement.   IMPRESSION: 1. Advanced cervical spine degeneration with multilevel anterolisthesis measuring up  to 7 mm at C6-7. 2. Spinal stenosis with cord indentation at C3-4 and C7-T1. 3. Advanced bilateral foraminal impingement at C5-6 to C7-T1. Moderate foraminal impingement on the left at C3-4. 4. C4-5 ACDF with solid fusion.  Electronically Signed   By: Jorje Guild M.D.   On: 04/28/2022 08:41  I have personally reviewed the images and agree with the above interpretation.  Assessment and Plan: [name] is a pleasant 63 y.o. 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  She has significant anterolisthesis at C6-7 and C7-T1.  She has some foraminal compression due to this.  He is having numbness in her hands.  I will send her for nerve conduction study.  This may be due to neuropathy.  If so, I would not consider any surgical intervention for her.  We could consider intervention at C3-4, but it may not alleviate her concerns.  I do not think she is an appropriate candidate for cervical thoracic fusion.  Will discuss next steps after her nerve conduction study.  I spent a total of 10 minutes in face-to-face and non-face-to-face activities related to this patient's care today.  Thank you for involving me in the care of this patient.      Azzure Garabedian K. Izora Ribas MD, [facility] Neurosurgery

## 2022-09-01 NOTE — Progress Notes (Signed)
Order has been faxed to [facility].

## 2022-09-01 NOTE — Patient Instructions (Signed)
[facility] Phone number is [phone]  We have faxed the order for the brace to them and they will call you to set up an appointment.

## 2022-09-10 ENCOUNTER — Ambulatory Visit: Payer: Medicare Other | Admitting: Neurosurgery

## 2022-09-14 NOTE — Telephone Encounter (Signed)
Patient will be contacted to schedule appt.

## 2022-09-23 NOTE — Telephone Encounter (Signed)
10/27/2022 

## 2022-10-01 ENCOUNTER — Other Ambulatory Visit: Payer: Self-pay

## 2022-10-01 MED ORDER — FLUTICASONE PROPIONATE 50 MCG/ACT NA SUSP: 2.0000 | Freq: Every day | NASAL | 5 refills | Status: DC

## 2022-10-09 ENCOUNTER — Other Ambulatory Visit: Payer: Self-pay

## 2022-10-09 MED ORDER — HYDROXYZINE HCL 25 MG PO TABS: 25.0000 mg | ORAL_TABLET | Freq: Three times a day (TID) | ORAL | 3 refills | Status: DC | PRN

## 2022-11-06 ENCOUNTER — Other Ambulatory Visit: Payer: Self-pay

## 2022-11-06 MED ORDER — CYCLOBENZAPRINE HCL 10 MG PO TABS: 10.0000 mg | ORAL_TABLET | Freq: Three times a day (TID) | ORAL | 3 refills | Status: DC | PRN

## 2022-11-06 MED ORDER — PROMETHAZINE HCL 25 MG PO TABS: 25.0000 mg | ORAL_TABLET | Freq: Three times a day (TID) | ORAL | 1 refills | Status: DC | PRN

## 2022-11-06 MED ORDER — CYCLOBENZAPRINE HCL 10 MG PO TABS: 10.0000 mg | ORAL_TABLET | Freq: Three times a day (TID) | ORAL | 1 refills | Status: DC | PRN

## 2022-11-09 NOTE — Telephone Encounter (Signed)
Patient no showed her appt for EMG on 10/27/2022

## 2022-11-10 IMAGING — DX DG CHEST 1V PORT
1 series · 1 of 1 positions shown · non-contrast
Comparison: 12/06/2019

CLINICAL DATA: Altered mental status.

EXAM:
PORTABLE CHEST 1 VIEW

[chest ap]
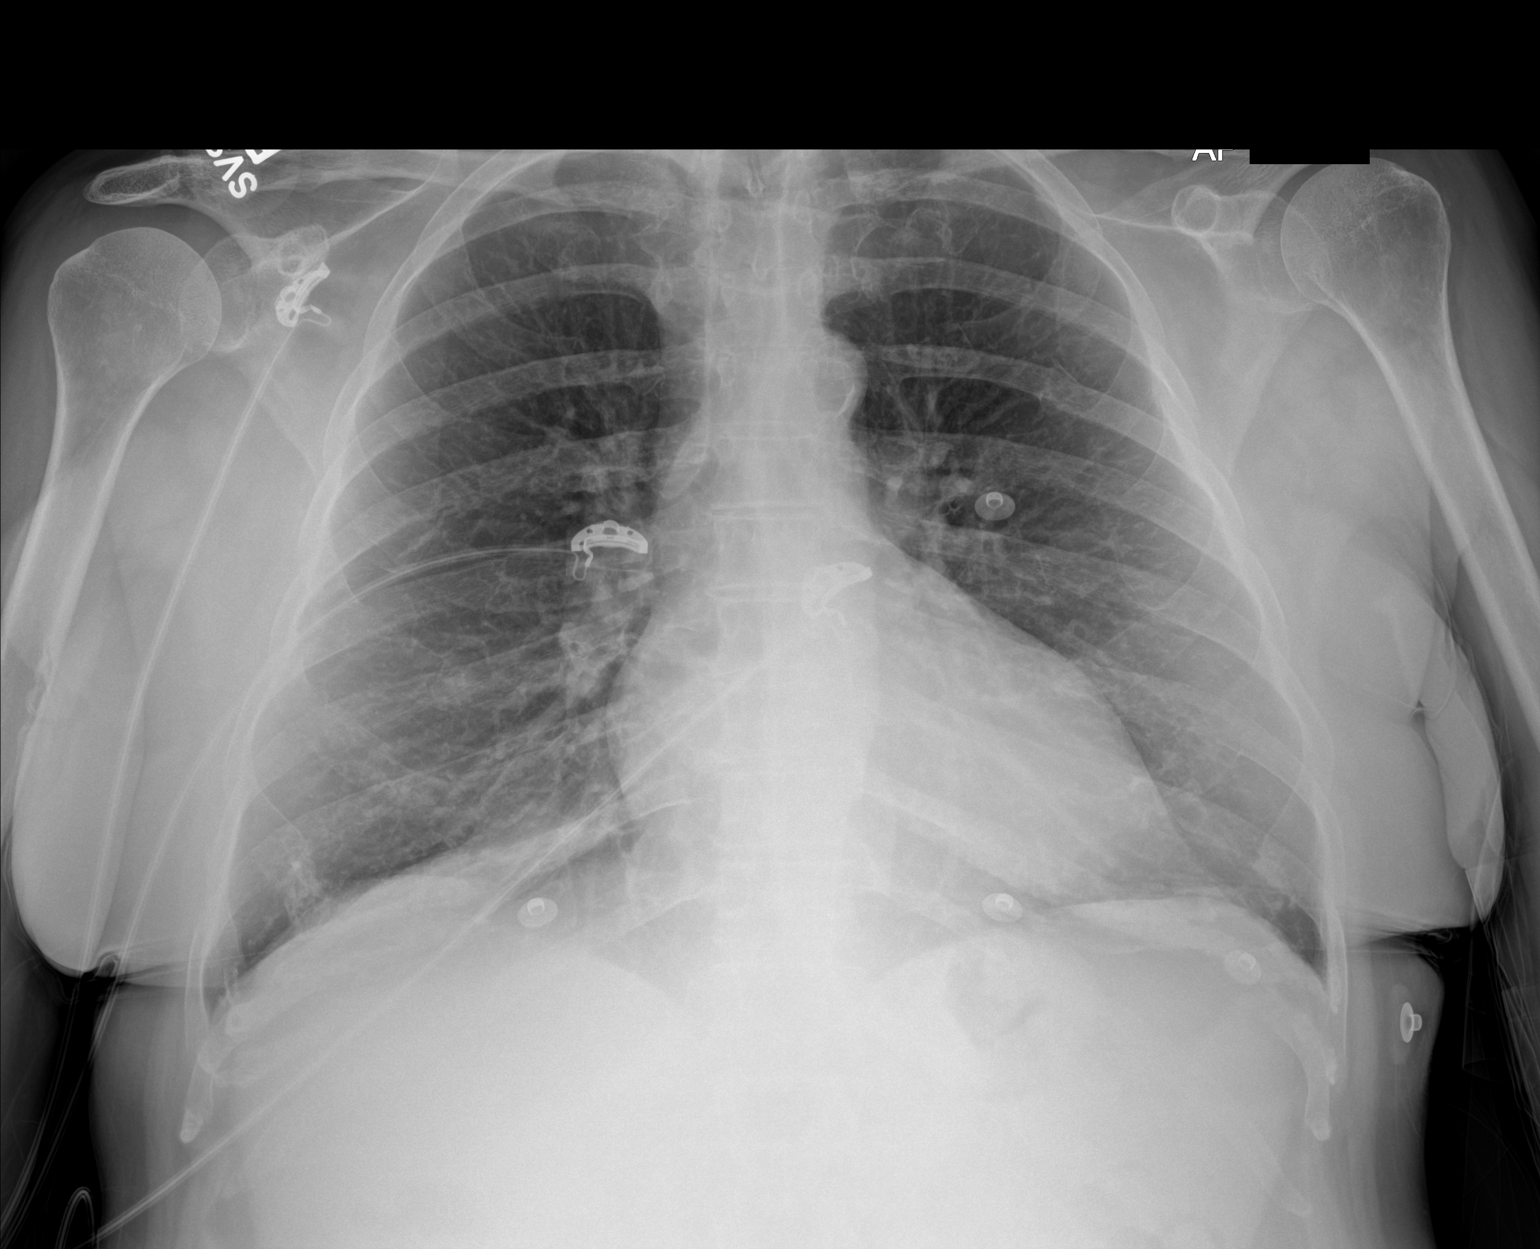

[1 of 1 positions shown; findings below may reference images not displayed]

FINDINGS: Normal sized heart. Clear lungs. Costal cartilage calcifications.
Cervical spine fixation hardware.
IMPRESSION: No acute abnormality.

## 2022-11-10 IMAGING — CR DG THORACIC SPINE 2V
1 series · 2 of 2 positions shown · non-contrast
Comparison: 05/15/2017

CLINICAL DATA: Back pain

EXAM:
THORACIC SPINE 2 VIEWS

[Series 1: dg thoracic spine 2 view · 0.14mm/px · 2 of 2 slices shown]
[im 1/2]
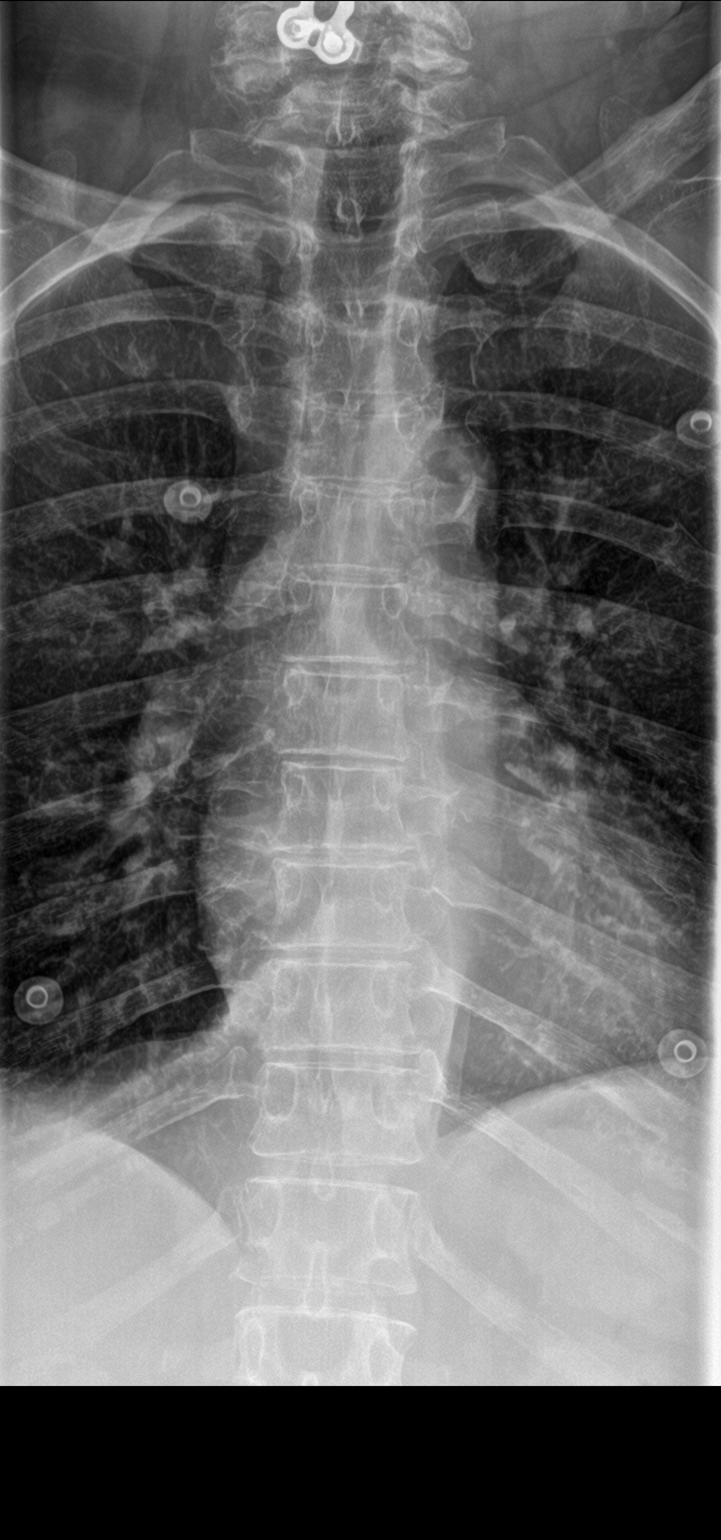
[im 2/2]
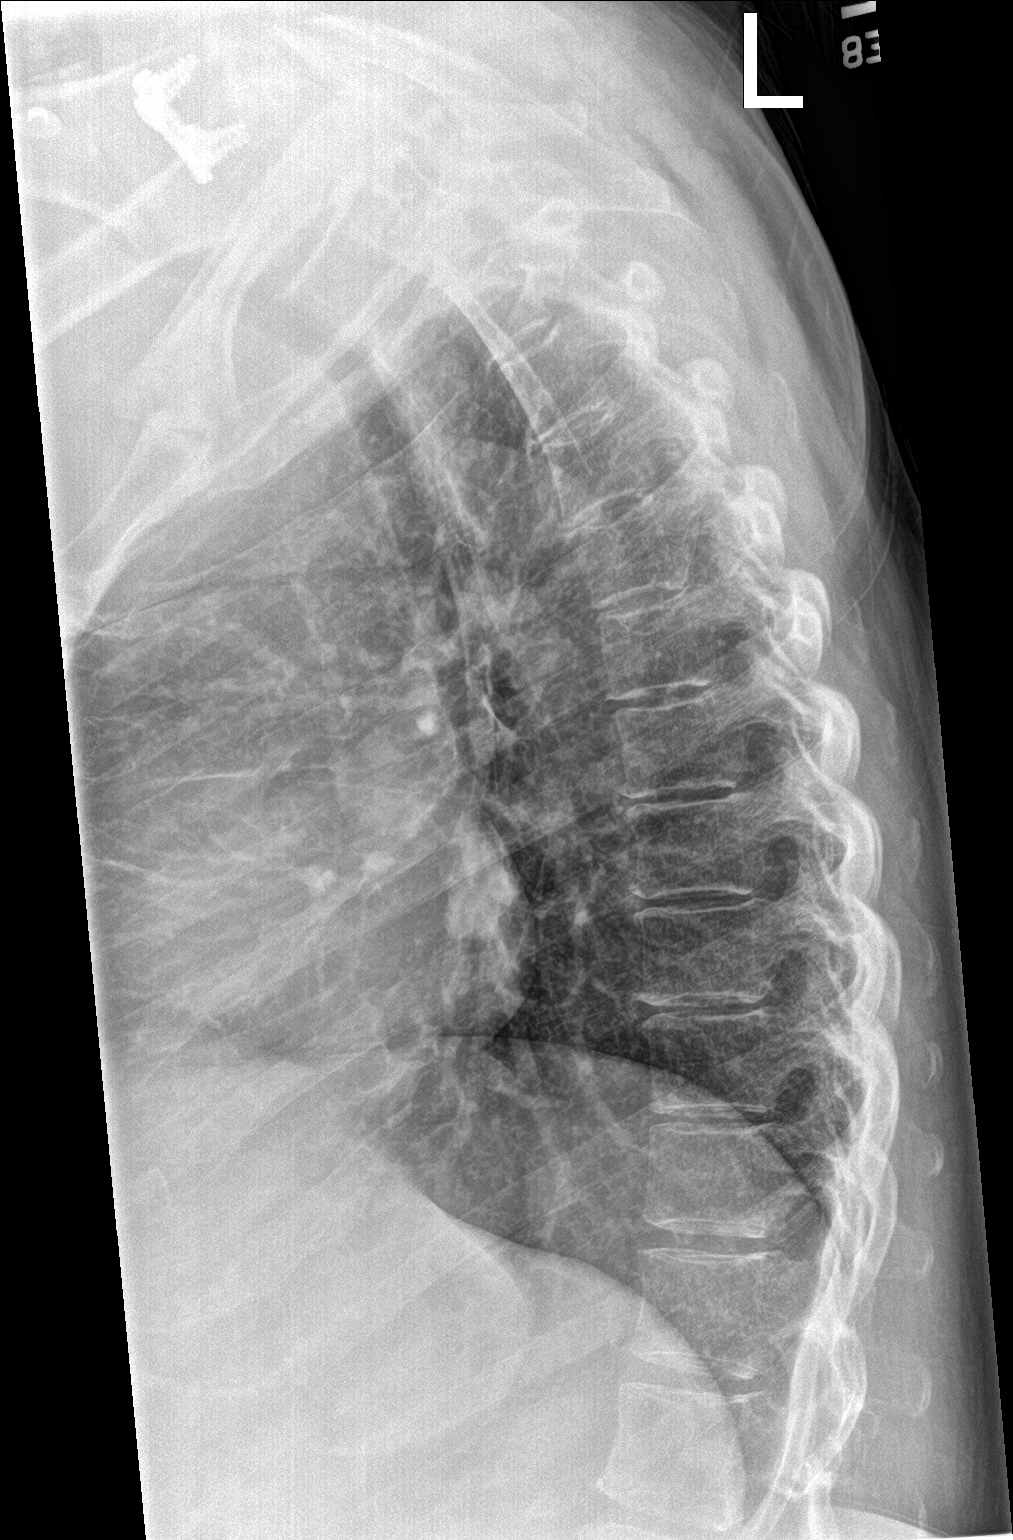

[2 of 2 positions shown; findings below may reference images not displayed]

FINDINGS: There is no evidence of thoracic spine fracture. Alignment is
normal. Disc spaces are preserved. Partially visualized lower
cervical ACDF hardware. No other significant bone abnormalities are
identified. Aortic atherosclerosis.
IMPRESSION: Negative.

## 2022-11-18 ENCOUNTER — Ambulatory Visit: Payer: Medicare Other | Admitting: Internal Medicine

## 2022-11-19 DIAGNOSIS — L03116 Cellulitis of left lower limb: Secondary | ICD-10-CM | POA: Insufficient documentation

## 2022-11-23 ENCOUNTER — Ambulatory Visit: Payer: Medicare Other | Admitting: Internal Medicine

## 2022-11-26 DIAGNOSIS — L97521 Non-pressure chronic ulcer of other part of left foot limited to breakdown of skin: Secondary | ICD-10-CM | POA: Insufficient documentation

## 2022-12-10 ENCOUNTER — Other Ambulatory Visit: Payer: Self-pay

## 2022-12-10 MED ORDER — GLIPIZIDE 10 MG PO TABS: 10.0000 mg | ORAL_TABLET | Freq: Every day | ORAL | 0 refills | Status: DC

## 2022-12-17 ENCOUNTER — Other Ambulatory Visit: Payer: Self-pay

## 2022-12-17 MED ORDER — GLIPIZIDE 5 MG PO TABS: 10.0000 mg | ORAL_TABLET | Freq: Every day | ORAL | 1 refills | Status: DC

## 2022-12-29 ENCOUNTER — Other Ambulatory Visit: Payer: Self-pay

## 2022-12-29 MED ORDER — DONEPEZIL HCL 10 MG PO TABS: 10.0000 mg | ORAL_TABLET | Freq: Every day | ORAL | 0 refills | Status: DC

## 2022-12-30 ENCOUNTER — Other Ambulatory Visit: Payer: Self-pay

## 2022-12-30 MED ORDER — CYCLOBENZAPRINE HCL 10 MG PO TABS: 10.0000 mg | ORAL_TABLET | Freq: Three times a day (TID) | ORAL | 1 refills | Status: DC | PRN

## 2022-12-30 MED ORDER — PROMETHAZINE HCL 25 MG PO TABS: 25.0000 mg | ORAL_TABLET | Freq: Three times a day (TID) | ORAL | 1 refills | Status: DC | PRN

## 2023-01-19 ENCOUNTER — Other Ambulatory Visit: Payer: Self-pay | Admitting: Neurosurgery

## 2023-01-19 ENCOUNTER — Telehealth: Payer: Self-pay | Admitting: Neurosurgery

## 2023-01-19 NOTE — Telephone Encounter (Signed)
Patient is calling asking to see if Dr.Yarbrough would be willing to prescribe 2 weeks of percocet 10/325 [place] brand only. Dr.Crisp her pain doctor was in the process of retiring and had a new NP in his office. He had issues with her and had to let her go. So right now he has no one in his office to prescribe pain medication to his patients. He told her to call around to her other doctor's to see if someone would be willing to prescribe medication until he is able to get back into the office or get someone into the office.  Dr.Crisp said that he would be willing to talk to the providers if needed.  [facility] [address] [phone]  *I did go over our pain medication policy but she wanted me to send a message any ways.*

## 2023-01-19 NOTE — Telephone Encounter (Signed)
Patient notified to contact her PCP, she agreed.

## 2023-01-20 ENCOUNTER — Other Ambulatory Visit: Payer: Self-pay | Admitting: Internal Medicine

## 2023-01-22 IMAGING — MR MR THORACIC SPINE W/O CM
6 of 7 series · 30 of 48 positions shown · non-contrast
Comparison: Thoracolumbar radiographs 11/07/2020. Cervical spine
MRI 10/14/2020. Thoracic spine MRI 06/04/2005.

CLINICAL DATA: 59-year-old female with persistent back pain and leg
weakness. No known injury.

EXAM:
MRI THORACIC SPINE WITHOUT CONTRAST
TECHNIQUE: Multiplanar, multisequence MR imaging of the thoracic spine was
performed. No intravenous contrast was administered.

[Series 16: T1 · sagittal · 5.0mm · 1.41mm/px · 2 of 9 slices shown (1 of 3)]
[im 1/9]
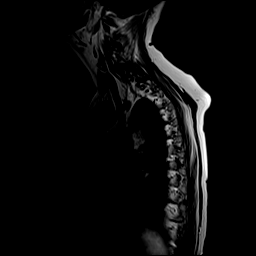
[im 9/9]
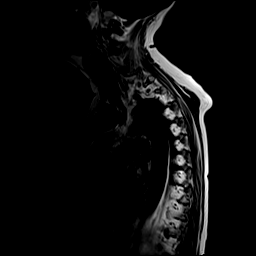

[Series 18: T1 · sagittal · 6.0mm · 1.41mm/px · 2 of 9 slices shown (2 of 3)]
[im 1/9]
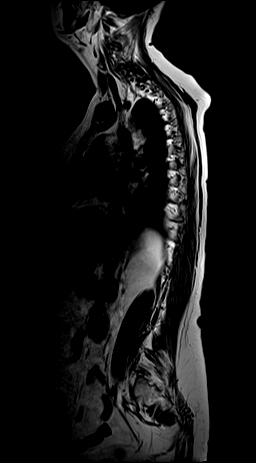
[im 9/9]
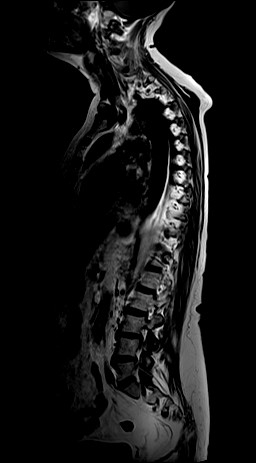

[Series 19: T2 · sagittal · 3.0mm · 1.06mm/px · 6 of 17 slices shown (1 of 2)]
[im 1/17]
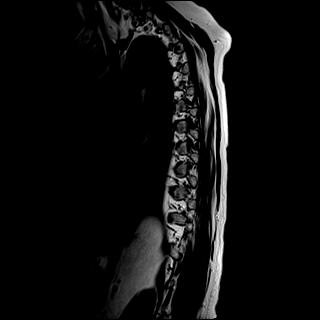
[im 4/17]
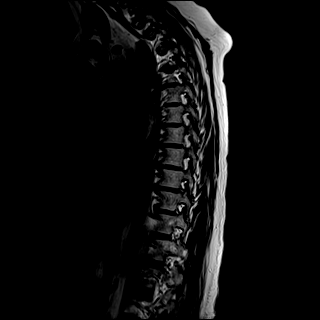
[im 7/17]
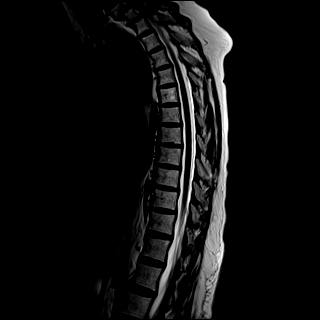
[im 10/17]
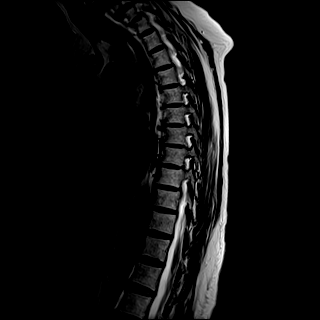
[im 13/17]
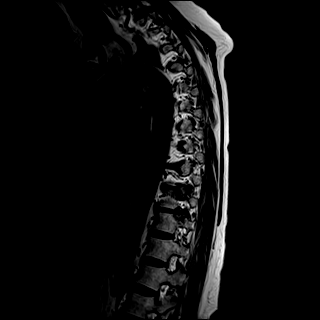
[im 17/17]
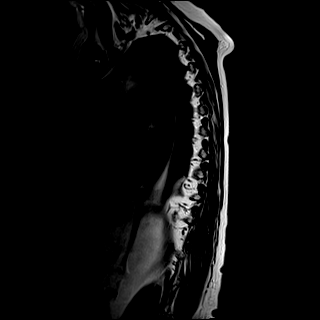

[Series 20: T1 · sagittal · 3.0mm · 1.06mm/px · 6 of 17 slices shown (3 of 3)]
[im 1/17]
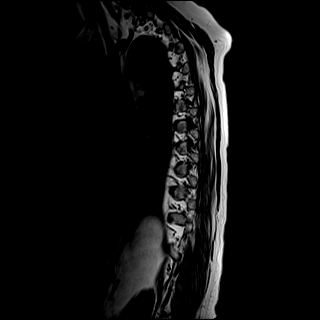
[im 4/17]
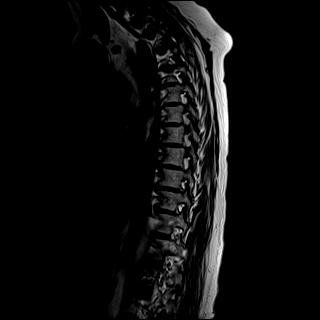
[im 7/17]
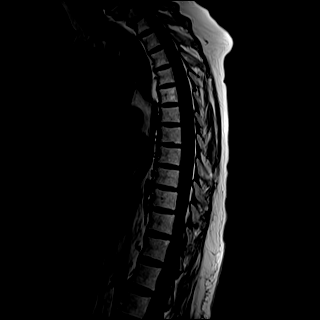
[im 10/17]
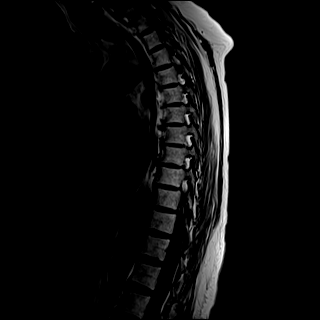
[im 13/17]
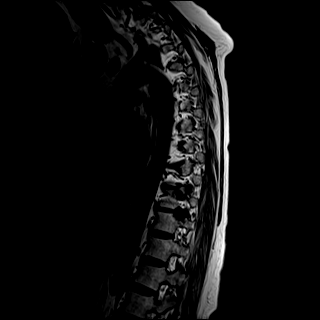
[im 17/17]
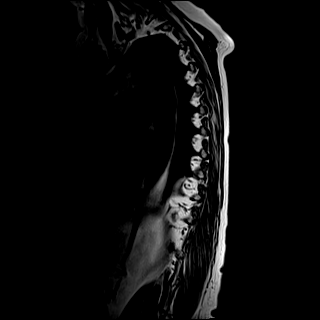

[Series 21: STIR · sagittal · 3.0mm · 0.53mm/px · 5 of 17 slices shown]
[im 1/17]
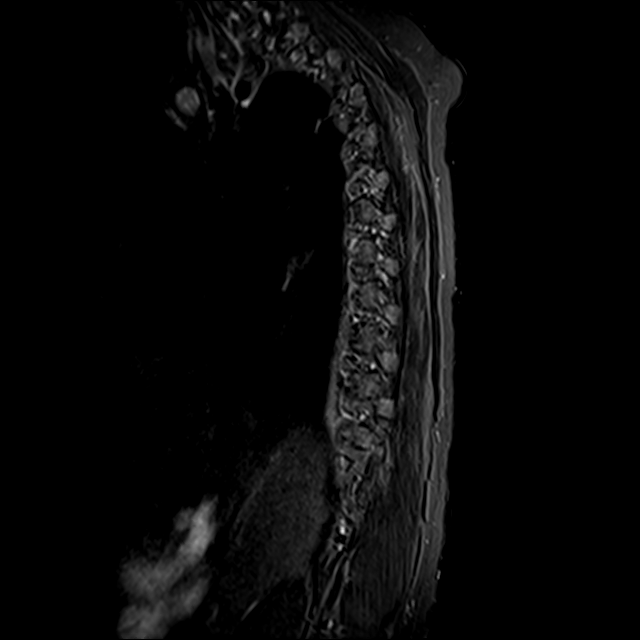
[im 4/17]
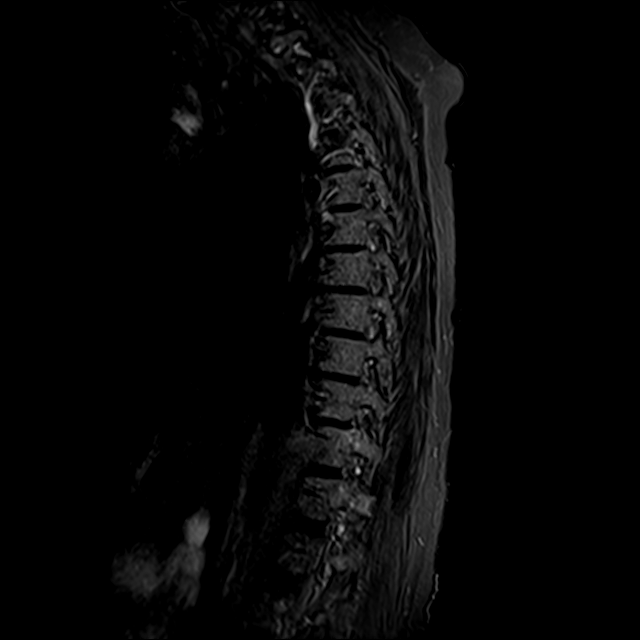
[im 7/17]
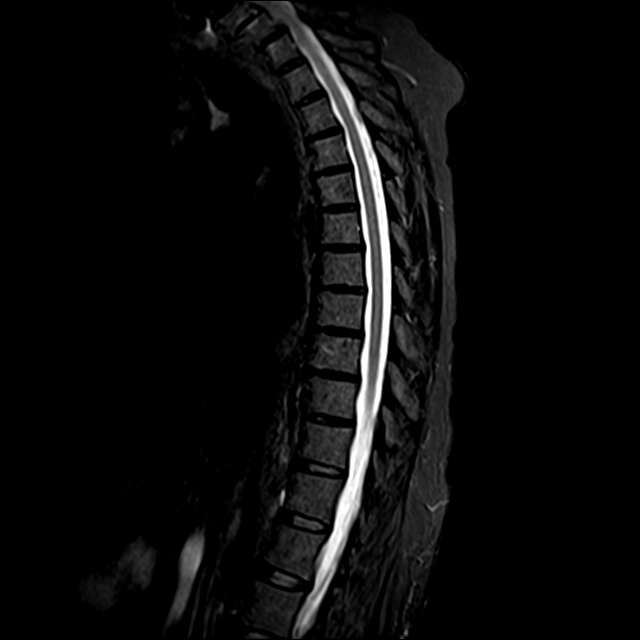
[im 10/17]
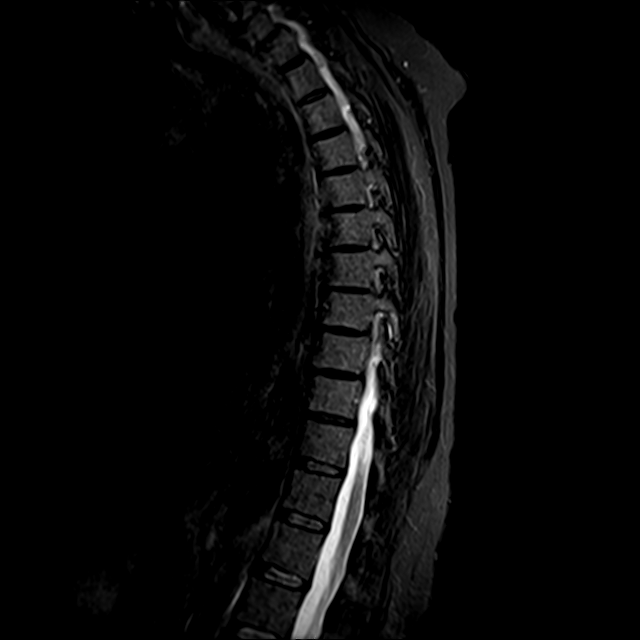
[im 13/17]
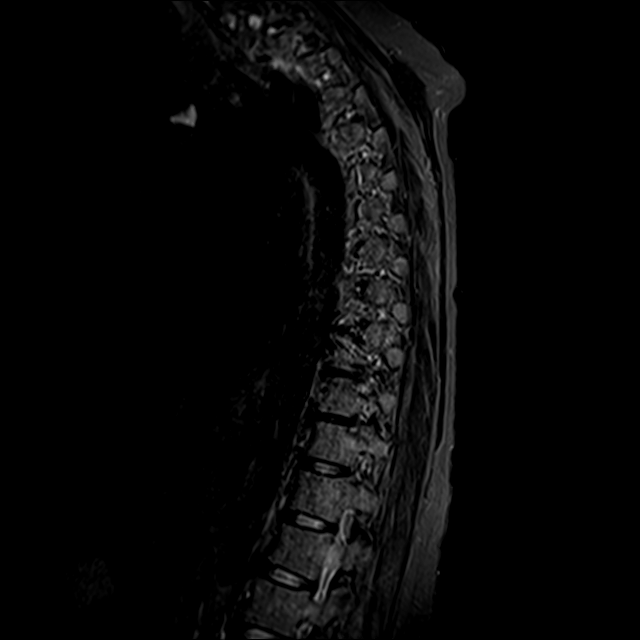

[Series 22: T2 · axial · 4.0mm · 0.59mm/px · z∈[-284,-88]mm · 9 of 39 slices shown (2 of 2)]
[im 1/39]
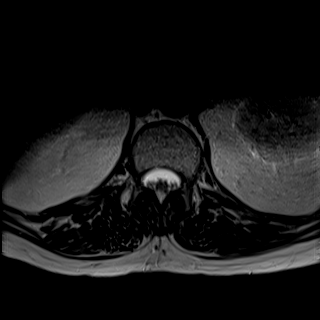
[im 7/39]
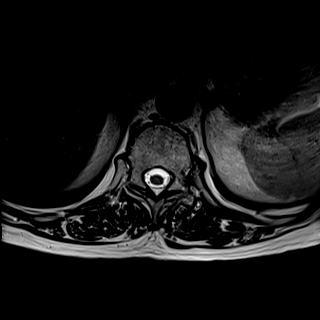
[im 13/39]
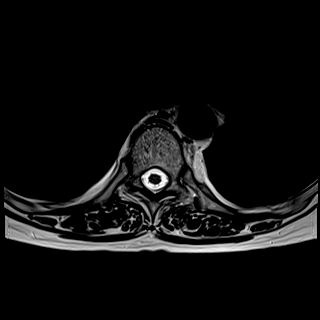
[im 16/39]
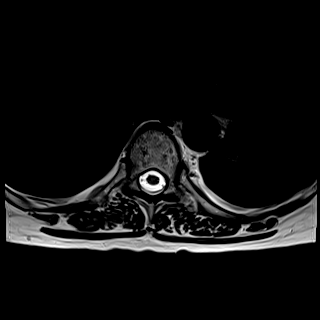
[im 20/39]
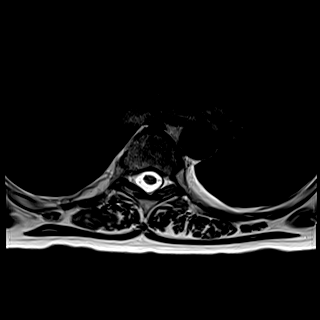
[im 23/39]
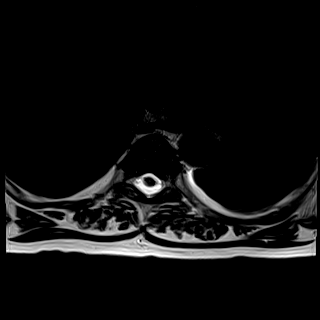
[im 26/39]
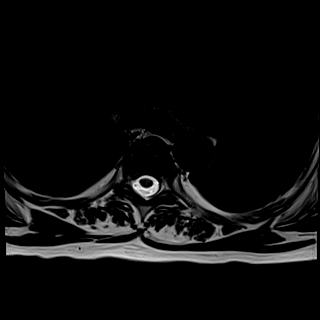
[im 32/39]
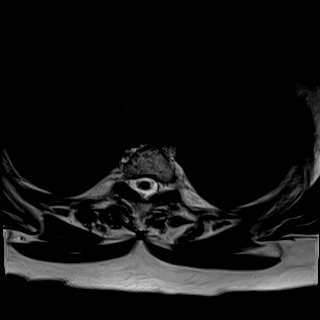
[im 39/39]
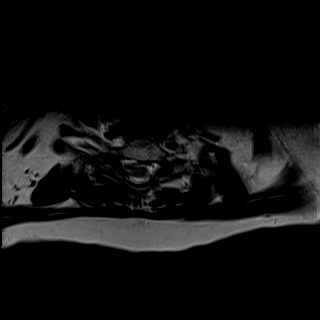

[30 of 48 positions shown; findings below may reference images not displayed]

FINDINGS: Limited cervical spine imaging: Stable since [REDACTED] with multilevel
advanced cervical spine spondylolisthesis, previous C4-C5 ACDF.

Thoracic spine segmentation:  Appears to be normal.

Alignment: Anterolisthesis of C7 on T1 measuring about 3 mm is new
since the 4119 MRI. Elsewhere thoracic kyphosis has mildly increased
and does appear mildly exaggerated for age in the upper thoracic
spine. There is associated subtle anterolisthesis of T1 on T2. And
also subtle anterolisthesis of T9 on T10 and T10 on T11.

Vertebrae: No marrow edema or evidence of acute osseous abnormality.
Normal background bone marrow signal.

Cord: Normal. Capacious thoracic spinal canal. Normal cauda equina
at T12-L1.

Paraspinal and other soft tissues: Negative.

Disc levels:

T1-T2: Subtle anterolisthesis. Minimal disc bulging. Mild facet
hypertrophy. No stenosis.

T2-T3: Mild facet hypertrophy.  No stenosis.

T3-T4: Mild to moderate facet hypertrophy greater on the right. Mild
right T3 neural foraminal stenosis.

T4-T5: Mild facet hypertrophy. Mild bilateral T4 foraminal stenosis.

T5-T6: Mild facet hypertrophy.  No stenosis.

T6-T7: Subtle posterior disc bulging. Mild to moderate facet
hypertrophy. Borderline to mild bilateral T6 foraminal stenosis.

T7-T8: Subtle disc bulging. Mild to moderate facet hypertrophy.
Borderline to mild T7 foraminal stenosis mostly on the right.

T8-T9: Subtle disc bulging. Moderate facet hypertrophy greater on
the left. Mild T8 foraminal stenosis mostly on the left.

T9-T10: Subtle anterolisthesis and disc bulging. Moderate facet
hypertrophy. Mild bilateral T9 foraminal stenosis.

T10-T11: Subtle anterolisthesis and disc bulging. Mild to moderate
facet hypertrophy. Mild left T10 foraminal stenosis.

T11-T12: Negative.

T12-L1: Negative.
IMPRESSION: 1. No acute osseous abnormality in the thoracic spine. The dominant
thoracic degenerative finding is facet hypertrophy, associated with
subtle anterolisthesis of T1 on T2, T9 on T10 and T10 on T11.
Note superimposed mildly exaggerated upper thoracic kyphosis, and
advanced lower cervical spine spondylolisthesis through the C7-T1
level.
2. Minimal superimposed thoracic disc degeneration. And no thoracic
spinal stenosis. Normal thoracic spinal cord.
3. Intermittent mild thoracic neural foraminal stenosis associated
with the facet hypertrophy in #1.

## 2023-01-25 ENCOUNTER — Other Ambulatory Visit: Payer: Self-pay | Admitting: Internal Medicine

## 2023-02-04 ENCOUNTER — Encounter: Payer: Self-pay | Admitting: Student

## 2023-02-04 ENCOUNTER — Ambulatory Visit: Payer: Medicare Other | Admitting: Student

## 2023-02-04 VITALS — BP 130/78 | HR 88 | Temp 97.4°F | Resp 18 | Ht 63.0 in | Wt 148.4 lb

## 2023-02-04 DIAGNOSIS — J449 Chronic obstructive pulmonary disease, unspecified: Secondary | ICD-10-CM | POA: Diagnosis not present

## 2023-02-04 DIAGNOSIS — I5032 Chronic diastolic (congestive) heart failure: Secondary | ICD-10-CM

## 2023-02-04 DIAGNOSIS — J309 Allergic rhinitis, unspecified: Secondary | ICD-10-CM

## 2023-02-04 DIAGNOSIS — F419 Anxiety disorder, unspecified: Secondary | ICD-10-CM

## 2023-02-04 DIAGNOSIS — L97922 Non-pressure chronic ulcer of unspecified part of left lower leg with fat layer exposed: Secondary | ICD-10-CM

## 2023-02-04 DIAGNOSIS — K219 Gastro-esophageal reflux disease without esophagitis: Secondary | ICD-10-CM

## 2023-02-04 DIAGNOSIS — M50121 Cervical disc disorder at C4-C5 level with radiculopathy: Secondary | ICD-10-CM

## 2023-02-04 DIAGNOSIS — F0390 Unspecified dementia without behavioral disturbance: Secondary | ICD-10-CM | POA: Diagnosis not present

## 2023-02-04 DIAGNOSIS — E119 Type 2 diabetes mellitus without complications: Secondary | ICD-10-CM

## 2023-02-04 DIAGNOSIS — R112 Nausea with vomiting, unspecified: Secondary | ICD-10-CM

## 2023-02-04 DIAGNOSIS — E2740 Unspecified adrenocortical insufficiency: Secondary | ICD-10-CM

## 2023-02-04 MED ORDER — POTASSIUM CHLORIDE CRYS ER 20 MEQ PO TBCR: 20.0000 meq | EXTENDED_RELEASE_TABLET | Freq: Two times a day (BID) | ORAL | 2 refills | Status: DC

## 2023-02-04 MED ORDER — HYDROXYZINE HCL 25 MG PO TABS: 25.0000 mg | ORAL_TABLET | Freq: Three times a day (TID) | ORAL | 3 refills | Status: DC | PRN

## 2023-02-04 MED ORDER — CYCLOBENZAPRINE HCL 10 MG PO TABS: 10.0000 mg | ORAL_TABLET | Freq: Three times a day (TID) | ORAL | 2 refills | Status: DC | PRN

## 2023-02-04 MED ORDER — PREDNISONE 5 MG PO TABS: 15.0000 mg | ORAL_TABLET | Freq: Every day | ORAL | 1 refills | Status: DC

## 2023-02-04 MED ORDER — FLUTICASONE PROPIONATE 50 MCG/ACT NA SUSP: 2.0000 | Freq: Every day | NASAL | 5 refills | Status: DC

## 2023-02-04 MED ORDER — OMEPRAZOLE 40 MG PO CPDR: 40.0000 mg | DELAYED_RELEASE_CAPSULE | Freq: Two times a day (BID) | ORAL | 1 refills | Status: DC

## 2023-02-04 MED ORDER — FUROSEMIDE 80 MG PO TABS: 80.0000 mg | ORAL_TABLET | Freq: Three times a day (TID) | ORAL | 3 refills | Status: DC

## 2023-02-04 MED ORDER — PROMETHAZINE HCL 25 MG PO TABS: 25.0000 mg | ORAL_TABLET | Freq: Three times a day (TID) | ORAL | 3 refills | Status: DC | PRN

## 2023-02-04 NOTE — Assessment & Plan Note (Signed)
I suggested changing albuterol to [facility] and using albuterol as needed.  The patient declines due to concerns of allergies.

## 2023-02-04 NOTE — Assessment & Plan Note (Signed)
And hemoglobin A1c will be collected today for evaluation of type 2 diabetes.  The patient does not use any insulin and is also not using Glucotrol.

## 2023-02-04 NOTE — Assessment & Plan Note (Signed)
I will refill Flexeril.  Same discussion as above about medications that can be sedating.  I encouraged the patient to continue to see Ortho specialist.

## 2023-02-04 NOTE — Assessment & Plan Note (Signed)
I will refill Aricept.  She will need a follow-up visit to discuss changing medications.

## 2023-02-04 NOTE — Assessment & Plan Note (Signed)
The patient is using alprazolam which can be a habit-forming medication.  I would like to see the patient decrease dosages of alprazolam to to reduce fall risk as this medication can also be sedating.

## 2023-02-04 NOTE — Progress Notes (Signed)
Established Patient Office Visit  Subjective   Patient ID: [name], female    DOB: 11/09/1960  Age: 62 y.o. MRN: [id number]  Chief Complaint  Patient presents with   Follow-up    [name] is a 62 year old white female here today for an annual exam.  This visit was initially a follow-up however, since there are gaps in visits it is necessary to update the patient's records.  The patient is agreeable to do so.  The patient has a significant history of:  COPD: The patient is a smoker since the age of 62 years old. She is now intermittently smoking less than half a pack a day.  No longer using Advair, instead using albuterol daily and says that she is doing well. She does not use supplemental oxygen and no Cpap. She does not wish to change her therapy. Specialist: pulmonology.  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 She has followed [facility] and is currently managed with prednisone 10mg  daily. Has been treated with systemic steroids since at least 2015 with doses generally from 10-20 mg day (increases for illness). She had tapered to 5 mg in 2022 but subsequently had an increase in pulmonary symptoms and resumed higher dose. She is being followed by endocrinology but is wanting Dr. Leonia Reader to manage this going forward. She is taking 15 mg of prednisone daily.   DM type 2: checking fbs at home with ranges from 120-218. The last hgba1c was 8.0 (05/2022) she is taking glipizide 5 mg daily. She states "I never started the Lantus because she is not comfortable with taking insulin.  Labs will be collected today.   Dementia: Has been seen by Neurology intermittently since 2014 for memory loss. Most recent assessment is Mild Cognitive Impairment of unknown etiology. Has not been seen in >1 year. Taking Aricept 10 mg but is wanting to know if there is something that we can add or increase due to worsening memory. She says that around lunch  time she is more forgetful.   GERD: she is using omeprazole 40 mg daily. She has not had a bone density screening in a long time. She has not had a colonoscopy in many years but it was suggested that she have one. She has intractable nausea and vomiting since total cuff vaginal dehiscence and is taking promethazine 25 mg.   Chronic diastolic CHF: HF but with relatively normal Echo 01/2023.She is taking lasix 80 mg twice daily but will use the third dose if necessary along with potassium 20 meq twice daily.Today she denies chest pain, shortness of breath.  She says that there is is lower leg and feet swelling but not outside of her normal.  She hasn't seen cardiology in over a year.   Cervical disc disease: The patient's past medical history is notable for cevical stenosis and arthritis. She was evaluated by at [facility]. 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 DXA last in 12/2021. Currently using flexeril 30 mg daily for muscle spasms but is no longer using gabapentin. She states that Ortho has left her messages to return to do hip arthroscopy on the right hip. However, since the dislocation and other injuries she feels that she is not up to having another procedure right now.  She denies any recent injuries or falls.  Today she is ambulating with a cane. Using calcium and vitamin d.  Ulcer of bilateral lower legs: [facility] has been treating bilateral Calf wounds since 08/18/21.  There are recurrent skin tears on BLE. Goals for treatment are to continue collagen,  topical wound therapy, and compression therapy.   Allergies: using hydroxyzine 25 mg tablets 3 times daily.   Anxiety: using alprazolam 1 mg 2-4 times daily. She reports having days where her anxiety is mild and days that it is more severe. She says that she never takes the medication all at once and that she separates them out during the day.        Review of Systems  Constitutional:  Negative for chills and fever.  Eyes: Negative.   Respiratory:  Positive for shortness of breath.   Cardiovascular:  Positive for leg swelling. Negative for chest pain, orthopnea, claudication and PND.  Gastrointestinal:  Positive for nausea. Negative for abdominal pain, blood in stool and vomiting.  Genitourinary: Negative.   Musculoskeletal:  Positive for back pain, joint pain and neck pain. Negative for myalgias.  Skin: Negative.   Neurological: Negative.   Endo/Heme/Allergies:  Positive for environmental allergies.  Psychiatric/Behavioral:  Positive for memory loss. Negative for hallucinations, substance abuse and suicidal ideas. The patient is nervous/anxious.       Objective:     BP 130/78 (BP Location: Left Arm, Patient Position: Sitting, Cuff Size: Normal)   Pulse 88   Temp (!) 97.4 F (36.3 C)   Resp 18   Ht 5\' 3"  (1.6 m)   Wt 148 lb 6 oz (67.3 kg)   SpO2 95%   BMI 26.28 kg/m    Physical Exam Vitals reviewed.  Constitutional:      Appearance: Normal appearance. She is not ill-appearing.  HENT:     Head: Normocephalic and atraumatic.     Right Ear: Tympanic membrane and ear canal normal.     Left Ear: Tympanic membrane and ear canal normal.     Nose: Nose normal. No congestion.     Mouth/Throat:     Mouth: Mucous membranes are dry.  Eyes:     Conjunctiva/sclera: Conjunctivae normal.     Pupils: Pupils are equal, round, and reactive to light.  Cardiovascular:     Rate and Rhythm: Rhythm irregularly irregular.     Pulses: Normal pulses.  Pulmonary:     Effort: Pulmonary effort is normal.     Breath sounds: Examination of the right-upper field reveals decreased breath sounds. Examination of the left-upper field reveals decreased breath sounds. Examination of the right-lower field reveals decreased breath sounds. Examination of the left-lower field reveals decreased breath sounds. Decreased breath sounds present. No  wheezing, rhonchi or rales.  Abdominal:     General: Abdomen is flat. Bowel sounds are normal. There is no distension.     Palpations: Abdomen is soft.     Tenderness: There is no abdominal tenderness.  Musculoskeletal:     Cervical back: Normal range of motion and neck supple. Pain with movement present. Normal range of motion.     Right lower leg: 1+ Pitting Edema present.     Left lower leg: 1+ Pitting Edema present.  Neurological:     Mental Status: She is alert.        The 10-year ASCVD risk score (Arnett DK, et al., 2019) is: 17%    Assessment & Plan:   Problem List Items Addressed This Visit     GERD (gastroesophageal reflux disease)    Refills been sent in for omeprazole.  She is  due for colonoscopy so a referral has been placed for GI.  I will also draw labs today that include vitamin D.  The patient also should have another DEXA scan for osteoporosis.      Relevant Medications   omeprazole (PRILOSEC) 40 MG capsule   Other Relevant Orders   VITAMIN D 25 Hydroxy (Vit-D Deficiency, Fractures)   CBC with Differential/Platelet   CMP14+BNP   Vitamin B12   COPD (chronic obstructive pulmonary disease) (HCC)    I suggested changing albuterol to [facility] and using albuterol as needed.  The patient declines due to concerns of allergies.      Relevant Medications   promethazine (PHENERGAN) 25 MG tablet   fluticasone (FLONASE) 50 MCG/ACT nasal spray   predniSONE (DELTASONE) 5 MG tablet   Anxiety    The patient is using alprazolam which can be a habit-forming medication.  I would like to see the patient decrease dosages of alprazolam to to reduce fall risk as this medication can also be sedating.      Relevant Medications   hydrOXYzine (ATARAX) 25 MG tablet   Adrenal insufficiency (HCC)    She has been followed by endocrinology but would like my attending to manage her prednisone.  At this time I will refill her prednisone but I think it is important for her to continue  to see endocrinology, my attending also agrees.      Relevant Orders   Cortisol   Intractable nausea and vomiting    I have refilled her promethazine.  Same discussion as above about medications that can be sedating..      Chronic diastolic CHF (congestive heart failure) (HCC)    She will continue using Lasix and replacing with potassium.  Labs have been collected today.  I encouraged the patient to see cardiology for concerns of fatigue, this could be related to an irregularly irregular rhythm as noted on assessment.  Vital signs are stable.      Relevant Medications   furosemide (LASIX) 80 MG tablet   Type 2 diabetes mellitus without complication (HCC) - Primary    And hemoglobin A1c will be collected today for evaluation of type 2 diabetes.  The patient does not use any insulin and is also not using Glucotrol.      Relevant Orders   Hemoglobin A1c   Lipid Profile   Dementia without behavioral disturbance (HCC)    I will refill Aricept.  She will need a follow-up visit to discuss changing medications.      Relevant Medications   cyclobenzaprine (FLEXERIL) 10 MG tablet   hydrOXYzine (ATARAX) 25 MG tablet   Allergic rhinitis due to allergen    She will continue using Atarax and Flonase.  We have discussed medications that can be sedating and lead to other issues like falls.  The patient verbalizes understanding.      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  Same discussion as above about medications that can be sedating.  I encouraged the patient to continue to see Ortho specialist.      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       Return in about 3 months (around 05/07/2023) for 3 month f/u.    Edwena Blow, NP

## 2023-02-04 NOTE — Assessment & Plan Note (Addendum)
She will continue using Atarax and Flonase.  We have discussed medications that can be sedating and lead to other issues like falls.  The patient verbalizes understanding.

## 2023-02-04 NOTE — Assessment & Plan Note (Signed)
She will continue using Lasix and replacing with potassium.  Labs have been collected today.  I encouraged the patient to see cardiology for concerns of fatigue, this could be related to an irregularly irregular rhythm as noted on assessment.  Vital signs are stable.

## 2023-02-04 NOTE — Assessment & Plan Note (Signed)
I have refilled her promethazine.  Same discussion as above about medications that can be sedating.Marland Kitchen

## 2023-02-04 NOTE — Assessment & Plan Note (Signed)
She has been followed by endocrinology but would like my attending to manage her prednisone.  At this time I will refill her prednisone but I think it is important for her to continue to see endocrinology, my attending also agrees.

## 2023-02-04 NOTE — Assessment & Plan Note (Signed)
She will continue to follow wound care management.

## 2023-02-04 NOTE — Assessment & Plan Note (Signed)
Refills been sent in for omeprazole.  She is due for colonoscopy so a referral has been placed for GI.  I will also draw labs today that include vitamin D.  The patient also should have another DEXA scan for osteoporosis.

## 2023-02-05 LAB — CMP14+BNP
ALT: 16 IU/L (ref 0–32)
AST: 17 IU/L (ref 0–40)
Albumin: 4.1 g/dL (ref 3.8–4.9)
Alkaline Phosphatase: 126 IU/L — ABNORMAL HIGH (ref 44–121)
BUN/Creatinine Ratio: 14 (ref 12–28)
BUN: 14 mg/dL (ref 8–27)
Bilirubin Total: 0.2 mg/dL (ref 0.0–1.2)
CO2: 27 mmol/L (ref 20–29)
Calcium: 9 mg/dL (ref 8.7–10.3)
Chloride: 86 mmol/L — ABNORMAL LOW (ref 96–106)
Creatinine, Ser: 1.03 mg/dL — ABNORMAL HIGH (ref 0.57–1.00)
Globulin, Total: 2.6 g/dL (ref 1.5–4.5)
Glucose: 171 mg/dL — ABNORMAL HIGH (ref 70–99)
Potassium: 3.8 mmol/L (ref 3.5–5.2)
Sodium: 133 mmol/L — ABNORMAL LOW (ref 134–144)
Total Protein: 6.7 g/dL (ref 6.0–8.5)
eGFR: 62 mL/min/1.73

## 2023-02-05 LAB — CBC WITH DIFFERENTIAL/PLATELET
Basophils Absolute: 0.1 x10E3/uL (ref 0.0–0.2)
Basos: 0 %
EOS (ABSOLUTE): 0 x10E3/uL (ref 0.0–0.4)
Eos: 0 %
Hematocrit: 34.3 % (ref 34.0–46.6)
Hemoglobin: 10 g/dL — ABNORMAL LOW (ref 11.1–15.9)
Immature Grans (Abs): 0.1 x10E3/uL (ref 0.0–0.1)
Immature Granulocytes: 1 %
Lymphocytes Absolute: 2.5 x10E3/uL (ref 0.7–3.1)
Lymphs: 13 %
MCH: 20.4 pg — ABNORMAL LOW (ref 26.6–33.0)
MCHC: 29.2 g/dL — ABNORMAL LOW (ref 31.5–35.7)
MCV: 70 fL — ABNORMAL LOW (ref 79–97)
Monocytes Absolute: 0.5 x10E3/uL (ref 0.1–0.9)
Monocytes: 3 %
Neutrophils Absolute: 16.4 x10E3/uL — ABNORMAL HIGH (ref 1.4–7.0)
Neutrophils: 83 %
Platelets: 532 x10E3/uL — ABNORMAL HIGH (ref 150–450)
RBC: 4.9 x10E6/uL (ref 3.77–5.28)
RDW: 20.4 % — ABNORMAL HIGH (ref 11.7–15.4)
WBC: 19.6 x10E3/uL — ABNORMAL HIGH (ref 3.4–10.8)

## 2023-02-05 LAB — LIPID PANEL
Chol/HDL Ratio: 3 ratio (ref 0.0–4.4)
Cholesterol, Total: 283 mg/dL — ABNORMAL HIGH (ref 100–199)
HDL: 93 mg/dL
LDL Chol Calc (NIH): 146 mg/dL — ABNORMAL HIGH (ref 0–99)
Triglycerides: 250 mg/dL — ABNORMAL HIGH (ref 0–149)
VLDL Cholesterol Cal: 44 mg/dL — ABNORMAL HIGH (ref 5–40)

## 2023-02-05 LAB — HEMOGLOBIN A1C
Est. average glucose Bld gHb Est-mCnc: 189 mg/dL
Hgb A1c MFr Bld: 8.2 % — ABNORMAL HIGH (ref 4.8–5.6)

## 2023-02-05 LAB — VITAMIN D 25 HYDROXY (VIT D DEFICIENCY, FRACTURES): Vit D, 25-Hydroxy: 49.4 ng/mL (ref 30.0–100.0)

## 2023-02-05 LAB — CORTISOL: Cortisol: 1.1 ug/dL — ABNORMAL LOW (ref 6.2–19.4)

## 2023-02-05 LAB — VITAMIN B12: Vitamin B-12: 412 pg/mL (ref 232–1245)

## 2023-02-15 ENCOUNTER — Other Ambulatory Visit: Payer: Self-pay | Admitting: Student

## 2023-02-16 ENCOUNTER — Telehealth: Payer: Self-pay

## 2023-02-16 ENCOUNTER — Other Ambulatory Visit: Payer: Self-pay | Admitting: Student

## 2023-02-16 DIAGNOSIS — E785 Hyperlipidemia, unspecified: Secondary | ICD-10-CM | POA: Insufficient documentation

## 2023-02-16 MED ORDER — METFORMIN HCL 500 MG PO TABS: 500.0000 mg | ORAL_TABLET | Freq: Every day | ORAL | 0 refills | Status: DC

## 2023-02-16 MED ORDER — ATORVASTATIN CALCIUM 10 MG PO TABS: 10.0000 mg | ORAL_TABLET | Freq: Every day | ORAL | 0 refills | Status: DC

## 2023-02-16 NOTE — Progress Notes (Signed)
I have reviewed the results of your recent test and lab work that you underwent at our clinic. Findings indicate an hgbA1c of 8.2, it was previously 8.0. Your cholesterol levels are very high, we need to bring these down to decrease your risk for heart attack/stroke.  Based on the test results, I would like to recommend the following course of action to address any health concern:  We should need to add metformin 500 mg at bedtime.  Atorvastatin 10 mg at bedtime, also I had referred you to GI for colonoscopy. This needs to happen soon as your complete blood count shows some blood loss. I will send these medications in today.

## 2023-02-16 NOTE — Telephone Encounter (Signed)
-----   Message from Edwena Blow sent at 02/16/2023 12:32 PM EDT ----- I have reviewed the results of your recent test and lab work that you underwent at our clinic. Findings indicate an hgbA1c of 8.2, it was previously 8.0. Your cholesterol levels are very high, we need to bring these down to decrease your risk for heart attack/stroke.  Based on the test results, I would like to recommend the following course of action to address any health concern:  We should need to add metformin 500 mg at bedtime.  Atorvastatin 10 mg at bedtime, also I had referred you to GI for colonoscopy. This needs to happen soon as your complete blood count shows some blood loss. I will send these medications in today.

## 2023-02-16 NOTE — Telephone Encounter (Signed)
Pt notified of lab results. Pt will start metformin first because she only likes to start one medication at a time. And will then start the Atorvastatin afterwards. Explained to pt the importance of starting the medications. As well as going to GI referral. Pt aware and notified

## 2023-03-11 ENCOUNTER — Ambulatory Visit: Payer: Medicare Other | Admitting: Dermatology

## 2023-03-30 ENCOUNTER — Other Ambulatory Visit: Payer: Self-pay | Admitting: Student

## 2023-03-30 ENCOUNTER — Other Ambulatory Visit: Payer: Self-pay

## 2023-03-30 DIAGNOSIS — F0390 Unspecified dementia without behavioral disturbance: Secondary | ICD-10-CM

## 2023-03-30 DIAGNOSIS — E119 Type 2 diabetes mellitus without complications: Secondary | ICD-10-CM

## 2023-03-30 DIAGNOSIS — R112 Nausea with vomiting, unspecified: Secondary | ICD-10-CM

## 2023-03-30 MED ORDER — MECLIZINE HCL 25 MG PO TABS: 25.0000 mg | ORAL_TABLET | Freq: Three times a day (TID) | ORAL | 0 refills | Status: DC | PRN

## 2023-03-30 MED ORDER — DONEPEZIL HCL 10 MG PO TABS: 10.0000 mg | ORAL_TABLET | Freq: Every day | ORAL | 0 refills | Status: DC

## 2023-03-30 MED ORDER — GLIPIZIDE 5 MG PO TABS: 5.0000 mg | ORAL_TABLET | Freq: Every day | ORAL | 1 refills | Status: DC

## 2023-03-30 NOTE — Progress Notes (Signed)
Rx Refill

## 2023-04-13 ENCOUNTER — Other Ambulatory Visit: Payer: Self-pay | Admitting: Internal Medicine

## 2023-04-13 DIAGNOSIS — R112 Nausea with vomiting, unspecified: Secondary | ICD-10-CM

## 2023-04-14 ENCOUNTER — Other Ambulatory Visit: Payer: Self-pay

## 2023-04-14 DIAGNOSIS — R112 Nausea with vomiting, unspecified: Secondary | ICD-10-CM

## 2023-04-14 MED ORDER — MECLIZINE HCL 25 MG PO TABS: 25.0000 mg | ORAL_TABLET | Freq: Three times a day (TID) | ORAL | 0 refills | Status: DC | PRN

## 2023-04-14 NOTE — Progress Notes (Signed)
Rx Refill

## 2023-04-27 ENCOUNTER — Other Ambulatory Visit: Payer: Self-pay | Admitting: Internal Medicine

## 2023-04-27 ENCOUNTER — Other Ambulatory Visit: Payer: Self-pay

## 2023-04-27 DIAGNOSIS — E119 Type 2 diabetes mellitus without complications: Secondary | ICD-10-CM

## 2023-04-27 DIAGNOSIS — F0390 Unspecified dementia without behavioral disturbance: Secondary | ICD-10-CM

## 2023-04-27 MED ORDER — GLIPIZIDE 5 MG PO TABS: 5.0000 mg | ORAL_TABLET | Freq: Every day | ORAL | 1 refills | Status: DC

## 2023-04-30 ENCOUNTER — Other Ambulatory Visit: Payer: Self-pay

## 2023-04-30 DIAGNOSIS — E119 Type 2 diabetes mellitus without complications: Secondary | ICD-10-CM

## 2023-04-30 MED ORDER — GLIPIZIDE 5 MG PO TABS: 10.0000 mg | ORAL_TABLET | Freq: Every day | ORAL | 1 refills | Status: DC

## 2023-05-12 ENCOUNTER — Other Ambulatory Visit: Payer: Self-pay | Admitting: Internal Medicine

## 2023-05-12 DIAGNOSIS — R112 Nausea with vomiting, unspecified: Secondary | ICD-10-CM

## 2023-05-24 ENCOUNTER — Other Ambulatory Visit: Payer: Self-pay | Admitting: Student

## 2023-05-25 ENCOUNTER — Other Ambulatory Visit: Payer: Self-pay | Admitting: Student

## 2023-05-25 DIAGNOSIS — F0390 Unspecified dementia without behavioral disturbance: Secondary | ICD-10-CM

## 2023-05-25 DIAGNOSIS — R112 Nausea with vomiting, unspecified: Secondary | ICD-10-CM

## 2023-05-25 MED ORDER — FUROSEMIDE 80 MG PO TABS: 80.0000 mg | ORAL_TABLET | Freq: Three times a day (TID) | ORAL | 3 refills | Status: DC

## 2023-05-25 MED ORDER — OMEPRAZOLE 40 MG PO CPDR: 40.0000 mg | DELAYED_RELEASE_CAPSULE | Freq: Two times a day (BID) | ORAL | 0 refills | Status: DC

## 2023-06-24 ENCOUNTER — Other Ambulatory Visit: Payer: Self-pay

## 2023-06-24 ENCOUNTER — Other Ambulatory Visit: Payer: Self-pay | Admitting: Internal Medicine

## 2023-06-24 ENCOUNTER — Other Ambulatory Visit: Payer: Self-pay | Admitting: Student

## 2023-06-24 DIAGNOSIS — F0390 Unspecified dementia without behavioral disturbance: Secondary | ICD-10-CM

## 2023-06-24 DIAGNOSIS — E119 Type 2 diabetes mellitus without complications: Secondary | ICD-10-CM

## 2023-06-24 DIAGNOSIS — R112 Nausea with vomiting, unspecified: Secondary | ICD-10-CM

## 2023-06-24 MED ORDER — MECLIZINE HCL 25 MG PO TABS: 25.0000 mg | ORAL_TABLET | Freq: Three times a day (TID) | ORAL | 0 refills | Status: DC

## 2023-06-24 MED ORDER — DONEPEZIL HCL 10 MG PO TABS: 10.0000 mg | ORAL_TABLET | Freq: Every day | ORAL | 0 refills | Status: DC

## 2023-06-24 NOTE — Progress Notes (Signed)
Rx refill

## 2023-07-06 ENCOUNTER — Other Ambulatory Visit: Payer: Self-pay | Admitting: Student

## 2023-07-19 ENCOUNTER — Other Ambulatory Visit: Payer: Self-pay | Admitting: Internal Medicine

## 2023-07-19 DIAGNOSIS — F0390 Unspecified dementia without behavioral disturbance: Secondary | ICD-10-CM

## 2023-07-26 ENCOUNTER — Other Ambulatory Visit: Payer: Self-pay

## 2023-07-26 MED ORDER — ATORVASTATIN CALCIUM 10 MG PO TABS: 10.0000 mg | ORAL_TABLET | Freq: Every day | ORAL | 0 refills | Status: DC

## 2023-07-29 ENCOUNTER — Other Ambulatory Visit: Payer: Self-pay | Admitting: Student

## 2023-07-30 ENCOUNTER — Other Ambulatory Visit: Payer: Self-pay

## 2023-08-02 ENCOUNTER — Other Ambulatory Visit: Payer: Self-pay | Admitting: Internal Medicine

## 2023-08-02 DIAGNOSIS — R112 Nausea with vomiting, unspecified: Secondary | ICD-10-CM

## 2023-08-03 ENCOUNTER — Other Ambulatory Visit: Payer: Self-pay

## 2023-08-03 DIAGNOSIS — M50121 Cervical disc disorder at C4-C5 level with radiculopathy: Secondary | ICD-10-CM

## 2023-08-03 DIAGNOSIS — R112 Nausea with vomiting, unspecified: Secondary | ICD-10-CM

## 2023-08-03 MED ORDER — PROMETHAZINE HCL 25 MG PO TABS: 25.0000 mg | ORAL_TABLET | Freq: Three times a day (TID) | ORAL | 3 refills | Status: DC | PRN

## 2023-08-03 MED ORDER — MECLIZINE HCL 25 MG PO TABS: 25.0000 mg | ORAL_TABLET | Freq: Three times a day (TID) | ORAL | 0 refills | Status: DC

## 2023-08-03 MED ORDER — CYCLOBENZAPRINE HCL 10 MG PO TABS: 10.0000 mg | ORAL_TABLET | Freq: Three times a day (TID) | ORAL | 2 refills | Status: DC | PRN

## 2023-08-03 NOTE — Progress Notes (Signed)
 Rx refills

## 2023-08-05 ENCOUNTER — Ambulatory Visit: Payer: Medicare Other | Admitting: Dermatology

## 2023-08-12 ENCOUNTER — Encounter: Payer: Self-pay | Admitting: Dermatology

## 2023-08-12 ENCOUNTER — Ambulatory Visit: Payer: Medicare Other | Admitting: Dermatology

## 2023-08-12 DIAGNOSIS — D692 Other nonthrombocytopenic purpura: Secondary | ICD-10-CM | POA: Diagnosis not present

## 2023-08-12 DIAGNOSIS — Z85820 Personal history of malignant melanoma of skin: Secondary | ICD-10-CM

## 2023-08-12 DIAGNOSIS — L720 Epidermal cyst: Secondary | ICD-10-CM | POA: Diagnosis not present

## 2023-08-12 DIAGNOSIS — L820 Inflamed seborrheic keratosis: Secondary | ICD-10-CM

## 2023-08-12 DIAGNOSIS — R234 Changes in skin texture: Secondary | ICD-10-CM

## 2023-08-12 DIAGNOSIS — C44629 Squamous cell carcinoma of skin of left upper limb, including shoulder: Secondary | ICD-10-CM | POA: Diagnosis not present

## 2023-08-12 DIAGNOSIS — D492 Neoplasm of unspecified behavior of bone, soft tissue, and skin: Secondary | ICD-10-CM

## 2023-08-12 DIAGNOSIS — C4492 Squamous cell carcinoma of skin, unspecified: Secondary | ICD-10-CM

## 2023-08-12 DIAGNOSIS — E559 Vitamin D deficiency, unspecified: Secondary | ICD-10-CM | POA: Insufficient documentation

## 2023-08-12 DIAGNOSIS — D489 Neoplasm of uncertain behavior, unspecified: Secondary | ICD-10-CM

## 2023-08-12 DIAGNOSIS — L821 Other seborrheic keratosis: Secondary | ICD-10-CM

## 2023-08-12 HISTORY — DX: Squamous cell carcinoma of skin, unspecified: C44.92

## 2023-08-12 NOTE — Progress Notes (Signed)
New Patient Visit   Subjective  [name] is a 63 y.o. female who presents for the following:  Patient here to re-establish care. Reports a history of melanoma at left leg several years ago (unsure) looked in GP and did not find records. Patient has several spots today she would like checked at right forehead itchy spot that she scratched off, spot at right elbow, bump at left shoulder,  and spot at left forearm   The patient has spots, moles and lesions to be evaluated, some may be new or changing and the patient may have concern these could be cancer.     The following portions of the chart were reviewed this encounter and updated as appropriate: medications, allergies, medical history  Review of Systems:  No other skin or systemic complaints except as noted in HPI or Assessment and Plan.  Objective  Well appearing patient in no apparent distress; mood and affect are within normal limits.    A focused examination was performed of the following areas: Left arm, right arm, left shoulder, face  Relevant exam findings are noted in the Assessment and Plan.  Left Shoulder - Anterior 13 mm pink keratotic plaque   right forehead x 1 Erythematous stuck-on, waxy papule or plaque  Assessment & Plan    Crusted plaque / excoriation  Exam: crusted plaque at left forearm   Treatment Plan: H/o of trauma   If not healed in 2 months will recheck will    SEBORRHEIC KERATOSIS Posterior left shoulder - Stuck-on, waxy, tan-brown papules and/or plaques  - Benign-appearing - Discussed benign etiology and prognosis. - Observe - Call for any change   EPIDERMAL INCLUSION CYST Exam: Subcutaneous nodule at right elbow  Benign-appearing. Exam most consistent with an epidermal inclusion cyst. Discussed that a cyst is a benign growth that can grow over time and sometimes get irritated or inflamed. Recommend observation if it is not bothersome. Discussed option of surgical excision to  remove it if it is growing, symptomatic, or other changes noted. Please call for new or changing lesions so they can be evaluated.   Purpura - Chronic; persistent and recurrent.  Treatable, but not curable. - Violaceous macules and patches - Benign - Related to trauma, age, sun damage and/or use of blood thinners, chronic use of topical and/or oral steroids - Observe - Can use OTC arnica containing moisturizer such as Dermend Bruise Formula if desired - Call for worsening or other concerns  HISTORY OF MELANOMA Patient reports hx at left leg  - No evidence of recurrence today - No lymphadenopathy - Recommend regular full body skin exams - Recommend daily broad spectrum sunscreen SPF 30+ to sun-exposed areas, reapply every 2 hours as needed.  - Call if any new or changing lesions are noted between office visits  NEOPLASM OF UNCERTAIN BEHAVIOR Left Shoulder - Anterior Skin / nail biopsy Type of biopsy: tangential   Informed consent: discussed and consent obtained   Timeout: patient name, date of birth, surgical site, and procedure verified   Procedure prep:  Patient was prepped and draped in usual sterile fashion Prep type:  Isopropyl alcohol Anesthesia: the lesion was anesthetized in a standard fashion   Anesthetic:  1% lidocaine w/ epinephrine 1-100,000 buffered w/ 8.4% NaHCO3 Instrument used: DermaBlade   Hemostasis achieved with: pressure and aluminum chloride   Outcome: patient tolerated procedure well   Post-procedure details: sterile dressing applied and wound care instructions given   Dressing type: bandage and petrolatum  Specimen 1 - Surgical pathology Differential Diagnosis: R/o SCC vs ISK  Check Margins: No R/o SCC vs ISK INFLAMED SEBORRHEIC KERATOSIS right forehead x 1 Symptomatic, irritating, patient would like treated. Patient had band aids on right forehead lesion. Soaked them with alcohol to help them peel off with less trauma and pain. Some alcohol got into  patient's right eye, which caused temporary discomfort. Offered washing the eye with water, patient declined. Patient felt better after a few minutes. Deferring cryotherapy charge due to incident Destruction of lesion - right forehead x 1 Complexity: simple   Destruction method: cryotherapy   Informed consent: discussed and consent obtained   Timeout:  patient name, date of birth, surgical site, and procedure verified Lesion destroyed using liquid nitrogen: Yes   Region frozen until ice ball extended beyond lesion: Yes   Cryo cycles: 1 or 2. Outcome: patient tolerated procedure well with no complications   Post-procedure details: wound care instructions given   EIC (EPIDERMAL INCLUSION CYST)   SEBORRHEIC KERATOSES   SCAB   SOLAR PURPURA (HCC)    Return for 2 month tbse and L forearm wound check hx of melanoma .  I, Asher Muir, CMA, am acting as scribe for Elie Goody, MD.   Documentation: I have reviewed the above documentation for accuracy and completeness, and I agree with the above.  Elie Goody, MD

## 2023-08-12 NOTE — Patient Instructions (Addendum)
Biopsy Wound Care Instructions  Leave the original bandage on for 24 hours if possible.  If the bandage becomes soaked or soiled before that time, it is OK to remove it and examine the wound.  A small amount of post-operative bleeding is normal.  If excessive bleeding occurs, remove the bandage, place gauze over the site and apply continuous pressure (no peeking) over the area for 30 minutes. If this does not work, please call our clinic as soon as possible or page your doctor if it is after hours.   Once a day, cleanse the wound with soap and water. It is fine to shower. If a thick crust develops you may use a Q-tip dipped into dilute hydrogen peroxide (mix 1:1 with water) to dissolve it.  Hydrogen peroxide can slow the healing process, so use it only as needed.    After washing, apply petroleum jelly (Vaseline) or an antibiotic ointment if your doctor prescribed one for you, followed by a bandage.    For best healing, the wound should be covered with a layer of ointment at all times. If you are not able to keep the area covered with a bandage to hold the ointment in place, this may mean re-applying the ointment several times a day.  Continue this wound care until the wound has healed and is no longer open.   Itching and mild discomfort is normal during the healing process. However, if you develop pain or severe itching, please call our office.   If you have any discomfort, you can take Tylenol (acetaminophen) or ibuprofen as directed on the bottle. (Please do not take these if you have an allergy to them or cannot take them for another reason).  Some redness, tenderness and white or yellow material in the wound is normal healing.  If the area becomes very sore and red, or develops a thick yellow-green material (pus), it may be infected; please notify us.    If you have stitches, return to clinic as directed to have the stitches removed. You will continue wound care for 2-3 days after the stitches  are removed.   Wound healing continues for up to one year following surgery. It is not unusual to experience pain in the scar from time to time during the interval.  If the pain becomes severe or the scar thickens, you should notify the office.    A slight amount of redness in a scar is expected for the first six months.  After six months, the redness will fade and the scar will soften and fade.  The color difference becomes less noticeable with time.  If there are any problems, return for a post-op surgery check at your earliest convenience.  To improve the appearance of the scar, you can use silicone scar gel, cream, or sheets (such as Mederma or Serica) every night for up to one year. These are available over the counter (without a prescription).  Please call our office at [phone] for any questions or concerns.  Seborrheic Keratosis  What causes seborrheic keratoses? Seborrheic keratoses are harmless, common skin growths that first appear during adult life.  As time goes by, more growths appear.  Some people may develop a large number of them.  Seborrheic keratoses appear on both covered and uncovered body parts.  They are not caused by sunlight.  The tendency to develop seborrheic keratoses can be inherited.  They vary in color from skin-colored to gray, brown, or even black.  They can be either  smooth or have a rough, warty surface.   Seborrheic keratoses are superficial and look as if they were stuck on the skin.  Under the microscope this type of keratosis looks like layers upon layers of skin.  That is why at times the top layer may seem to fall off, but the rest of the growth remains and re-grows.    Treatment Seborrheic keratoses do not need to be treated, but can easily be removed in the office.  Seborrheic keratoses often cause symptoms when they rub on clothing or jewelry.  Lesions can be in the way of shaving.  If they become inflamed, they can cause itching, soreness, or  burning.  Removal of a seborrheic keratosis can be accomplished by freezing, burning, or surgery. If any spot bleeds, scabs, or grows rapidly, please return to have it checked, as these can be an indication of a skin cancer.    Due to recent changes in healthcare laws, you may see results of your pathology and/or laboratory studies on MyChart before the doctors have had a chance to review them. We understand that in some cases there may be results that are confusing or concerning to you. Please understand that not all results are received at the same time and often the doctors may need to interpret multiple results in order to provide you with the best plan of care or course of treatment. Therefore, we ask that you please give [place] 2 business days to thoroughly review all your results before contacting the office for clarification. Should we see a critical lab result, you will be contacted sooner.   If You Need Anything After Your Visit  If you have any questions or concerns for your doctor, please call our main line at [phone] and press option 4 to reach your doctor's medical assistant. If no one answers, please leave a voicemail as directed and we will return your call as soon as possible. Messages left after 4 pm will be answered the following business day.   You may also send [place] a message via MyChart. We typically respond to MyChart messages within 1-2 business days.  For prescription refills, please ask your pharmacy to contact our office. Our fax number is [phone].  If you have an urgent issue when the clinic is closed that cannot wait until the next business day, you can page your doctor at the number below.    Please note that while we do our best to be available for urgent issues outside of office hours, we are not available 24/7.   If you have an urgent issue and are unable to reach [place], you may choose to seek medical care at your doctor's office, retail clinic, urgent care  center, or emergency room.  If you have a medical emergency, please immediately call 911 or go to the emergency department.  Pager Numbers  - Dr. Gwen Pounds: [phone]  - Dr. Roseanne Reno: [phone]  - Dr. Katrinka Blazing: [phone]   In the event of inclement weather, please call our main line at [phone] for an update on the status of any delays or closures.  Dermatology Medication Tips: Please keep the boxes that topical medications come in in order to help keep track of the instructions about where and how to use these. Pharmacies typically print the medication instructions only on the boxes and not directly on the medication tubes.   If your medication is too expensive, please contact our office at [phone] option 4 or send [place] a message through  MyChart.   We are unable to tell what your co-pay for medications will be in advance as this is different depending on your insurance coverage. However, we may be able to find a substitute medication at lower cost or fill out paperwork to get insurance to cover a needed medication.   If a prior authorization is required to get your medication covered by your insurance company, please allow [place] 1-2 business days to complete this process.  Drug prices often vary depending on where the prescription is filled and some pharmacies may offer cheaper prices.  The website www.goodrx.com contains coupons for medications through different pharmacies. The prices here do not account for what the cost may be with help from insurance (it may be cheaper with your insurance), but the website can give you the price if you did not use any insurance.  - You can print the associated coupon and take it with your prescription to the pharmacy.  - You may also stop by our office during regular business hours and pick up a GoodRx coupon card.  - If you need your prescription sent electronically to a different pharmacy, notify our office through [facility] or by  phone at [phone] option 4.     Si Usted Necesita Algo Despus de Su Visita  Tambin puede enviarnos un mensaje a travs de Clinical cytogeneticist. Por lo general respondemos a los mensajes de MyChart en el transcurso de 1 a 2 das hbiles.  Para renovar recetas, por favor pida a su farmacia que se ponga en contacto con nuestra oficina. Annie Sable de fax es [place] [phone].  Si tiene un asunto urgente cuando la clnica est cerrada y que no puede esperar hasta el siguiente da hbil, puede llamar/localizar a su doctor(a) al nmero que aparece a continuacin.   Por favor, tenga en cuenta que aunque hacemos todo lo posible para estar disponibles para asuntos urgentes fuera del horario de [place], no estamos disponibles las 24 horas del da, los 7 [address]  [address] de la [place].   Si tiene un problema urgente y no puede comunicarse con nosotros, puede optar por buscar atencin mdica  en el consultorio de su doctor(a), en una clnica privada, en un centro de atencin urgente o en una sala de emergencias.  Si tiene Engineer, drilling, por favor llame inmediatamente al 911 o vaya a la sala de emergencias.  Nmeros de bper  - Dr. Gwen Pounds: [phone]  - Dra. Roseanne Reno: [phone]  - Dr. Katrinka Blazing: [phone]   En caso de inclemencias del tiempo, por favor llame a Lacy Duverney principal al [phone] para una actualizacin sobre el [place] de cualquier retraso o cierre.  Consejos para la medicacin en dermatologa: Por favor, guarde las cajas en las que vienen los medicamentos de uso tpico para ayudarle a seguir las instrucciones sobre dnde y cmo usarlos. Las farmacias generalmente imprimen las instrucciones del medicamento slo en las cajas y no directamente en los tubos del [place].   Si su medicamento es muy caro, por favor, pngase en contacto con Rolm Gala llamando al [phone] y presione la opcin 4 o envenos un mensaje a travs de Clinical cytogeneticist.   No podemos decirle cul ser su copago  por los medicamentos por adelantado ya que esto es diferente dependiendo de la cobertura de su seguro. Sin embargo, es posible que podamos encontrar un medicamento sustituto a Audiological scientist un formulario para que el seguro cubra el medicamento que se considera necesario.   Si se requiere Futures trader  previa para que su compaa de seguros [place] su medicamento, por favor permtanos de 1 a 2 das hbiles para completar [address]  Los precios de los medicamentos varan con frecuencia dependiendo del Environmental consultant de dnde se surte la receta y alguna farmacias pueden ofrecer precios ms baratos.  El sitio web www.goodrx.com tiene cupones para medicamentos de Health and safety inspector. Los precios aqu no tienen en cuenta lo que podra costar con la ayuda del seguro (puede ser ms barato con su seguro), pero el sitio web puede darle el precio si no utiliz Tourist information centre manager.  - Puede imprimir el cupn correspondiente y llevarlo con su receta a la farmacia.  - Tambin puede pasar por nuestra oficina durante el horario de atencin regular y Education officer, museum una tarjeta de cupones de GoodRx.  - Si necesita que su receta se enve electrnicamente a una farmacia diferente, informe a nuestra oficina a travs de MyChart de [place] o por telfono llamando al [phone] y presione la opcin 4.

## 2023-08-13 ENCOUNTER — Other Ambulatory Visit: Payer: Self-pay

## 2023-08-13 DIAGNOSIS — F0390 Unspecified dementia without behavioral disturbance: Secondary | ICD-10-CM

## 2023-08-13 LAB — SURGICAL PATHOLOGY

## 2023-08-13 MED ORDER — DONEPEZIL HCL 10 MG PO TABS
10.0000 mg | ORAL_TABLET | Freq: Every day | ORAL | 1 refills | Status: DC
Start: 2023-08-13 — End: 2023-11-30

## 2023-08-13 NOTE — Progress Notes (Signed)
Rx refill

## 2023-08-16 ENCOUNTER — Telehealth: Payer: Self-pay

## 2023-08-16 NOTE — Telephone Encounter (Signed)
-----   Message from [place] sent at 08/16/2023  9:00 AM EST ----- Diagnosis: left shoulder - anterior :       MODERATELY DIFFERENTIATED SQUAMOUS CELL CARCINOMA   Please call to share diagnosis and offer excision  Explanation: This is a squamous cell skin cancer that has grown beyond the surface of the skin and is invading the second layer of the skin. It has the potential to spread beyond the skin and threaten your health, so I recommend treating it.  Treatment: you return for an hour long appointment where we perform a skin surgery. We numb the site of the skin cancer and a safety margin of normal skin around it. We remove the full thickness of skin and close the wound with two layers of stitches. The sample is sent to the lab to check that the skin cancer was fully removed. Return one week later to have wound checked and surface stitches removed. Surgical wound leaves a line scar. Approximately 95% cure rate

## 2023-08-17 ENCOUNTER — Telehealth: Payer: Self-pay

## 2023-08-17 ENCOUNTER — Other Ambulatory Visit: Payer: Self-pay

## 2023-08-17 DIAGNOSIS — D045 Carcinoma in situ of skin of trunk: Secondary | ICD-10-CM

## 2023-08-17 NOTE — Progress Notes (Signed)
Per Dr. Katrinka Blazing refer to Dr. Caralyn Guile so patient can have bx proven SCC treated sooner that his next available surgery appointment.

## 2023-08-17 NOTE — Telephone Encounter (Signed)
Discussed pathology results and surgery scheduled.

## 2023-08-30 ENCOUNTER — Telehealth: Payer: Self-pay

## 2023-08-30 ENCOUNTER — Encounter: Payer: Self-pay | Admitting: Dermatology

## 2023-08-30 NOTE — Telephone Encounter (Signed)
 I returned patient's call after she left a voicemail. Patient states that she is sick, is unable to get up, and is unable to keep her Mohs appointment with Dr. Paci. I advised patient that she would need to contact Dr. Shawn Delay office to cancel the appointment as we are a different office and do not work at that location or have access to their schedule. Patient said she didn't know if she would be able to do that today, and I reiterated that she needed to contact their office to cancel the appointment as this is not her office. Patient then hung up the phone.

## 2023-08-31 ENCOUNTER — Encounter: Payer: Medicare Other | Admitting: Dermatology

## 2023-09-07 ENCOUNTER — Other Ambulatory Visit: Payer: Self-pay | Admitting: Internal Medicine

## 2023-09-07 DIAGNOSIS — R112 Nausea with vomiting, unspecified: Secondary | ICD-10-CM

## 2023-09-07 MED ORDER — HYDROXYZINE HCL 25 MG PO TABS: 25.0000 mg | ORAL_TABLET | Freq: Three times a day (TID) | ORAL | 3 refills | Status: DC

## 2023-09-07 MED ORDER — MECLIZINE HCL 25 MG PO TABS: 25.0000 mg | ORAL_TABLET | Freq: Three times a day (TID) | ORAL | 0 refills | Status: DC

## 2023-09-07 MED ORDER — PREDNISONE 5 MG PO TABS: ORAL_TABLET | ORAL | 1 refills | Status: DC

## 2023-09-10 ENCOUNTER — Other Ambulatory Visit: Payer: Self-pay

## 2023-09-10 DIAGNOSIS — E119 Type 2 diabetes mellitus without complications: Secondary | ICD-10-CM

## 2023-09-10 MED ORDER — GLIPIZIDE 5 MG PO TABS
5.0000 mg | ORAL_TABLET | Freq: Every day | ORAL | 1 refills | Status: DC
Start: 2023-09-10 — End: 2023-11-30

## 2023-09-13 ENCOUNTER — Other Ambulatory Visit: Payer: Self-pay

## 2023-09-13 MED ORDER — ATORVASTATIN CALCIUM 10 MG PO TABS: 10.0000 mg | ORAL_TABLET | Freq: Every day | ORAL | 0 refills | Status: DC

## 2023-09-20 ENCOUNTER — Other Ambulatory Visit: Payer: Self-pay

## 2023-09-20 DIAGNOSIS — R112 Nausea with vomiting, unspecified: Secondary | ICD-10-CM

## 2023-09-20 DIAGNOSIS — E119 Type 2 diabetes mellitus without complications: Secondary | ICD-10-CM

## 2023-09-20 MED ORDER — MECLIZINE HCL 25 MG PO TABS: 25.0000 mg | ORAL_TABLET | Freq: Three times a day (TID) | ORAL | 0 refills | Status: DC

## 2023-09-20 MED ORDER — GLIPIZIDE 10 MG PO TABS: 10.0000 mg | ORAL_TABLET | Freq: Every day | ORAL | 0 refills | Status: DC

## 2023-09-20 NOTE — Progress Notes (Signed)
 Rx refill

## 2023-10-12 ENCOUNTER — Ambulatory Visit: Payer: Medicare Other | Admitting: Dermatology

## 2023-10-13 ENCOUNTER — Encounter: Payer: Medicare Other | Admitting: Dermatology

## 2023-10-19 DEATH — deceased
